# Patient Record
Sex: Male | Born: 1955
Health system: Southern US, Community
[De-identification: ages and names within clinical notes are randomized; demographics above are authoritative.]

## PROBLEM LIST (undated history)

## (undated) DIAGNOSIS — I700 Atherosclerosis of aorta: Secondary | ICD-10-CM

## (undated) DIAGNOSIS — N400 Enlarged prostate without lower urinary tract symptoms: Secondary | ICD-10-CM

## (undated) DIAGNOSIS — Z87442 Personal history of urinary calculi: Secondary | ICD-10-CM

## (undated) DIAGNOSIS — I441 Atrioventricular block, second degree: Secondary | ICD-10-CM

## (undated) DIAGNOSIS — D3502 Benign neoplasm of left adrenal gland: Secondary | ICD-10-CM

## (undated) DIAGNOSIS — Z7901 Long term (current) use of anticoagulants: Secondary | ICD-10-CM

## (undated) DIAGNOSIS — I480 Paroxysmal atrial fibrillation: Secondary | ICD-10-CM

## (undated) DIAGNOSIS — I2510 Atherosclerotic heart disease of native coronary artery without angina pectoris: Secondary | ICD-10-CM

## (undated) DIAGNOSIS — G4719 Other hypersomnia: Secondary | ICD-10-CM

## (undated) DIAGNOSIS — I10 Essential (primary) hypertension: Secondary | ICD-10-CM

## (undated) DIAGNOSIS — Z7902 Long term (current) use of antithrombotics/antiplatelets: Secondary | ICD-10-CM

## (undated) DIAGNOSIS — R011 Cardiac murmur, unspecified: Secondary | ICD-10-CM

## (undated) DIAGNOSIS — E7800 Pure hypercholesterolemia, unspecified: Secondary | ICD-10-CM

## (undated) DIAGNOSIS — I5189 Other ill-defined heart diseases: Secondary | ICD-10-CM

## (undated) DIAGNOSIS — M1990 Unspecified osteoarthritis, unspecified site: Secondary | ICD-10-CM

## (undated) DIAGNOSIS — I219 Acute myocardial infarction, unspecified: Secondary | ICD-10-CM

## (undated) DIAGNOSIS — A749 Chlamydial infection, unspecified: Secondary | ICD-10-CM

## (undated) DIAGNOSIS — I350 Nonrheumatic aortic (valve) stenosis: Secondary | ICD-10-CM

## (undated) DIAGNOSIS — Z950 Presence of cardiac pacemaker: Secondary | ICD-10-CM

## (undated) HISTORY — PX: CORONARY ARTERY BYPASS GRAFT: SHX141

## (undated) HISTORY — PX: APPENDECTOMY: SHX54

## (undated) HISTORY — PX: TRANSCATHETER AORTIC VALVE REPLACEMENT, TRANSAORTIC: SHX6402

## (undated) HISTORY — PX: INSERT / REPLACE / REMOVE PACEMAKER: SUR710

## (undated) HISTORY — PX: KNEE ARTHROSCOPY: SUR90

---

## 2008-07-26 HISTORY — PX: CARDIAC CATHETERIZATION: SHX172

## 2008-09-21 ENCOUNTER — Emergency Department: Payer: Self-pay | Admitting: Emergency Medicine

## 2008-09-23 DIAGNOSIS — I469 Cardiac arrest, cause unspecified: Secondary | ICD-10-CM

## 2008-09-23 DIAGNOSIS — Z951 Presence of aortocoronary bypass graft: Secondary | ICD-10-CM

## 2008-09-23 DIAGNOSIS — I2119 ST elevation (STEMI) myocardial infarction involving other coronary artery of inferior wall: Secondary | ICD-10-CM

## 2008-09-23 HISTORY — DX: Cardiac arrest, cause unspecified: I46.9

## 2008-09-23 HISTORY — DX: ST elevation (STEMI) myocardial infarction involving other coronary artery of inferior wall: I21.19

## 2008-09-23 HISTORY — PX: CORONARY ARTERY BYPASS GRAFT: SHX141

## 2008-09-23 HISTORY — DX: Presence of aortocoronary bypass graft: Z95.1

## 2008-10-16 ENCOUNTER — Encounter: Payer: Self-pay | Admitting: Internal Medicine

## 2008-10-24 ENCOUNTER — Encounter: Payer: Self-pay | Admitting: Internal Medicine

## 2010-09-15 ENCOUNTER — Ambulatory Visit: Payer: Self-pay | Admitting: Gastroenterology

## 2010-09-17 LAB — PATHOLOGY REPORT

## 2016-03-15 ENCOUNTER — Inpatient Hospital Stay
Admit: 2016-03-15 | Discharge: 2016-03-15 | Disposition: A | Discharge status: Home / Self Care | Payer: Commercial Managed Care - HMO | Attending: Internal Medicine | Admitting: Internal Medicine

## 2016-03-15 ENCOUNTER — Encounter: Payer: Self-pay | Admitting: Emergency Medicine

## 2016-03-15 ENCOUNTER — Emergency Department: Payer: Commercial Managed Care - HMO

## 2016-03-15 ENCOUNTER — Inpatient Hospital Stay
Admission: EM | Admit: 2016-03-15 | Discharge: 2016-03-17 | DRG: 281 | Disposition: A | Discharge status: Home / Self Care | Payer: Commercial Managed Care - HMO | Attending: Internal Medicine | Admitting: Internal Medicine

## 2016-03-15 DIAGNOSIS — Z8249 Family history of ischemic heart disease and other diseases of the circulatory system: Secondary | ICD-10-CM | POA: Diagnosis not present

## 2016-03-15 DIAGNOSIS — Z9889 Other specified postprocedural states: Secondary | ICD-10-CM | POA: Diagnosis not present

## 2016-03-15 DIAGNOSIS — I214 Non-ST elevation (NSTEMI) myocardial infarction: Secondary | ICD-10-CM | POA: Diagnosis present

## 2016-03-15 DIAGNOSIS — R55 Syncope and collapse: Secondary | ICD-10-CM | POA: Diagnosis present

## 2016-03-15 DIAGNOSIS — T464X6A Underdosing of angiotensin-converting-enzyme inhibitors, initial encounter: Secondary | ICD-10-CM | POA: Diagnosis present

## 2016-03-15 DIAGNOSIS — I25118 Atherosclerotic heart disease of native coronary artery with other forms of angina pectoris: Secondary | ICD-10-CM | POA: Diagnosis present

## 2016-03-15 DIAGNOSIS — R778 Other specified abnormalities of plasma proteins: Secondary | ICD-10-CM | POA: Diagnosis present

## 2016-03-15 DIAGNOSIS — I2582 Chronic total occlusion of coronary artery: Secondary | ICD-10-CM | POA: Diagnosis present

## 2016-03-15 DIAGNOSIS — T39016A Underdosing of aspirin, initial encounter: Secondary | ICD-10-CM | POA: Diagnosis present

## 2016-03-15 DIAGNOSIS — E782 Mixed hyperlipidemia: Secondary | ICD-10-CM | POA: Diagnosis present

## 2016-03-15 DIAGNOSIS — Z7902 Long term (current) use of antithrombotics/antiplatelets: Secondary | ICD-10-CM | POA: Diagnosis not present

## 2016-03-15 DIAGNOSIS — I161 Hypertensive emergency: Secondary | ICD-10-CM | POA: Diagnosis present

## 2016-03-15 DIAGNOSIS — Z91120 Patient's intentional underdosing of medication regimen due to financial hardship: Secondary | ICD-10-CM | POA: Diagnosis not present

## 2016-03-15 DIAGNOSIS — Z9114 Patient's other noncompliance with medication regimen: Secondary | ICD-10-CM | POA: Diagnosis not present

## 2016-03-15 DIAGNOSIS — Z951 Presence of aortocoronary bypass graft: Secondary | ICD-10-CM | POA: Diagnosis not present

## 2016-03-15 DIAGNOSIS — I252 Old myocardial infarction: Secondary | ICD-10-CM | POA: Diagnosis not present

## 2016-03-15 DIAGNOSIS — I25709 Atherosclerosis of coronary artery bypass graft(s), unspecified, with unspecified angina pectoris: Secondary | ICD-10-CM | POA: Diagnosis present

## 2016-03-15 DIAGNOSIS — E119 Type 2 diabetes mellitus without complications: Secondary | ICD-10-CM | POA: Diagnosis present

## 2016-03-15 DIAGNOSIS — Y92009 Unspecified place in unspecified non-institutional (private) residence as the place of occurrence of the external cause: Secondary | ICD-10-CM

## 2016-03-15 DIAGNOSIS — I2510 Atherosclerotic heart disease of native coronary artery without angina pectoris: Secondary | ICD-10-CM | POA: Diagnosis not present

## 2016-03-15 DIAGNOSIS — I10 Essential (primary) hypertension: Secondary | ICD-10-CM | POA: Diagnosis not present

## 2016-03-15 DIAGNOSIS — Z7982 Long term (current) use of aspirin: Secondary | ICD-10-CM | POA: Diagnosis not present

## 2016-03-15 DIAGNOSIS — Z8674 Personal history of sudden cardiac arrest: Secondary | ICD-10-CM | POA: Diagnosis not present

## 2016-03-15 DIAGNOSIS — E860 Dehydration: Secondary | ICD-10-CM | POA: Diagnosis not present

## 2016-03-15 DIAGNOSIS — I209 Angina pectoris, unspecified: Secondary | ICD-10-CM | POA: Insufficient documentation

## 2016-03-15 DIAGNOSIS — R7989 Other specified abnormal findings of blood chemistry: Secondary | ICD-10-CM | POA: Diagnosis present

## 2016-03-15 DIAGNOSIS — E7800 Pure hypercholesterolemia, unspecified: Secondary | ICD-10-CM | POA: Diagnosis not present

## 2016-03-15 HISTORY — DX: Essential (primary) hypertension: I10

## 2016-03-15 HISTORY — DX: Pure hypercholesterolemia, unspecified: E78.00

## 2016-03-15 HISTORY — DX: Acute myocardial infarction, unspecified: I21.9

## 2016-03-15 HISTORY — DX: Non-ST elevation (NSTEMI) myocardial infarction: I21.4

## 2016-03-15 LAB — TROPONIN I
TROPONIN I: 0.18 ng/mL — AB (ref <0.03)
TROPONIN I: 0.44 ng/mL — AB (ref <0.03)
Troponin I: 2.43 ng/mL (ref <0.03)

## 2016-03-15 LAB — BASIC METABOLIC PANEL
ANION GAP: 7 (ref 5–15)
BUN: 20 mg/dL (ref 6–20)
CO2: 25 mmol/L (ref 22–32)
Calcium: 9.2 mg/dL (ref 8.9–10.3)
Chloride: 105 mmol/L (ref 101–111)
Creatinine, Ser: 0.99 mg/dL (ref 0.61–1.24)
GFR calc Af Amer: >60 mL/min (ref >60)
Glucose, Bld: 130 mg/dL — ABNORMAL HIGH (ref 65–99)
POTASSIUM: 3.6 mmol/L (ref 3.5–5.1)
SODIUM: 137 mmol/L (ref 135–145)

## 2016-03-15 LAB — LIPID PANEL
CHOL/HDL RATIO: 2.7 ratio
CHOLESTEROL: 239 mg/dL — AB (ref 0–200)
HDL: 89 mg/dL (ref >40)
LDL Cholesterol: 144 mg/dL — ABNORMAL HIGH (ref 0–99)
Triglycerides: 28 mg/dL (ref <150)
VLDL: 6 mg/dL (ref 0–40)

## 2016-03-15 LAB — MRSA PCR SCREENING: MRSA by PCR: NEGATIVE

## 2016-03-15 LAB — CBC
HEMATOCRIT: 45.9 % (ref 40.0–52.0)
Hemoglobin: 15.9 g/dL (ref 13.0–18.0)
MCH: 30.6 pg (ref 26.0–34.0)
MCHC: 34.5 g/dL (ref 32.0–36.0)
MCV: 88.5 fL (ref 80.0–100.0)
Platelets: 231 10*3/uL (ref 150–440)
RBC: 5.19 MIL/uL (ref 4.40–5.90)
RDW: 13.9 % (ref 11.5–14.5)
WBC: 8.4 10*3/uL (ref 3.8–10.6)

## 2016-03-15 LAB — HEMOGLOBIN A1C: HEMOGLOBIN A1C: 5.7 % (ref 4.0–6.0)

## 2016-03-15 LAB — PROTIME-INR
INR: 1.05
PROTHROMBIN TIME: 13.7 s (ref 11.4–15.2)

## 2016-03-15 LAB — GLUCOSE, CAPILLARY: Glucose-Capillary: 123 mg/dL — ABNORMAL HIGH (ref 65–99)

## 2016-03-15 LAB — HEPARIN LEVEL (UNFRACTIONATED)
HEPARIN UNFRACTIONATED: 0.26 [IU]/mL — AB (ref 0.30–0.70)
Heparin Unfractionated: 0.42 IU/mL (ref 0.30–0.70)

## 2016-03-15 LAB — TSH: TSH: 0.438 u[IU]/mL (ref 0.350–4.500)

## 2016-03-15 LAB — APTT: APTT: 35 s (ref 24–36)

## 2016-03-15 MED ORDER — ACETAMINOPHEN 325 MG PO TABS
650.0000 mg | ORAL_TABLET | Freq: Four times a day (QID) | ORAL | Status: DC | PRN
  Administered 2016-03-15 – 2016-03-16 (×2): 650 mg via ORAL
  Filled 2016-03-15 (×3): qty 2

## 2016-03-15 MED ORDER — CARVEDILOL 6.25 MG PO TABS
12.5000 mg | ORAL_TABLET | Freq: Two times a day (BID) | ORAL | Status: DC
  Administered 2016-03-16: 12.5 mg via ORAL
  Filled 2016-03-15: qty 2

## 2016-03-15 MED ORDER — NITROGLYCERIN 0.4 MG SL SUBL
0.4000 mg | SUBLINGUAL_TABLET | SUBLINGUAL | Status: DC | PRN
  Administered 2016-03-15 (×2): 0.4 mg via SUBLINGUAL

## 2016-03-15 MED ORDER — SENNOSIDES-DOCUSATE SODIUM 8.6-50 MG PO TABS: 1.0000 | ORAL_TABLET | Freq: Every evening | ORAL | Status: DC | PRN

## 2016-03-15 MED ORDER — SIMVASTATIN 20 MG PO TABS: 20.0000 mg | ORAL_TABLET | Freq: Every day | ORAL | Status: DC

## 2016-03-15 MED ORDER — ONDANSETRON HCL 4 MG PO TABS: 4.0000 mg | ORAL_TABLET | Freq: Four times a day (QID) | ORAL | Status: DC | PRN

## 2016-03-15 MED ORDER — CETYLPYRIDINIUM CHLORIDE 0.05 % MT LIQD
7.0000 mL | Freq: Two times a day (BID) | OROMUCOSAL | Status: DC
  Administered 2016-03-16 – 2016-03-17 (×2): 7 mL via OROMUCOSAL

## 2016-03-15 MED ORDER — ASPIRIN 81 MG PO CHEW
324.0000 mg | CHEWABLE_TABLET | Freq: Once | ORAL | Status: AC
  Administered 2016-03-15: 324 mg via ORAL

## 2016-03-15 MED ORDER — BISACODYL 10 MG RE SUPP: 10.0000 mg | Freq: Every day | RECTAL | Status: DC | PRN

## 2016-03-15 MED ORDER — LISINOPRIL 20 MG PO TABS
20.0000 mg | ORAL_TABLET | Freq: Every day | ORAL | Status: DC
  Administered 2016-03-16: 20 mg via ORAL
  Filled 2016-03-15: qty 1

## 2016-03-15 MED ORDER — ASPIRIN 81 MG PO CHEW
81.0000 mg | CHEWABLE_TABLET | Freq: Every day | ORAL | Status: DC
  Administered 2016-03-16 – 2016-03-17 (×2): 81 mg via ORAL
  Filled 2016-03-15 (×2): qty 1

## 2016-03-15 MED ORDER — ACETAMINOPHEN 650 MG RE SUPP
650.0000 mg | Freq: Four times a day (QID) | RECTAL | Status: DC | PRN
  Filled 2016-03-15: qty 1

## 2016-03-15 MED ORDER — HEPARIN (PORCINE) IN NACL 100-0.45 UNIT/ML-% IJ SOLN
1400.0000 [IU]/h | INTRAMUSCULAR | Status: DC
  Administered 2016-03-15: 1200 [IU]/h via INTRAVENOUS
  Administered 2016-03-15: 1000 [IU]/h via INTRAVENOUS
  Administered 2016-03-16: 1200 [IU]/h via INTRAVENOUS
  Filled 2016-03-15 (×4): qty 250

## 2016-03-15 MED ORDER — HEPARIN BOLUS VIA INFUSION
4000.0000 [IU] | Freq: Once | INTRAVENOUS | Status: AC
Start: 2016-03-15 — End: 2016-03-15
  Administered 2016-03-15: 4000 [IU] via INTRAVENOUS
  Filled 2016-03-15: qty 4000

## 2016-03-15 MED ORDER — ASPIRIN 81 MG PO CHEW
CHEWABLE_TABLET | ORAL | Status: AC
  Administered 2016-03-15: 324 mg via ORAL
  Filled 2016-03-15: qty 4

## 2016-03-15 MED ORDER — HEPARIN BOLUS VIA INFUSION
1500.0000 [IU] | Freq: Once | INTRAVENOUS | Status: AC
  Administered 2016-03-15: 1500 [IU] via INTRAVENOUS
  Filled 2016-03-15: qty 1500

## 2016-03-15 MED ORDER — SODIUM CHLORIDE 0.9 % IV SOLN
INTRAVENOUS | Status: DC
  Administered 2016-03-15: 11:00:00 via INTRAVENOUS

## 2016-03-15 MED ORDER — ONDANSETRON HCL 4 MG/2ML IJ SOLN: 4.0000 mg | Freq: Four times a day (QID) | INTRAMUSCULAR | Status: DC | PRN

## 2016-03-15 MED ORDER — NITROGLYCERIN IN D5W 200-5 MCG/ML-% IV SOLN
0.0000 ug/min | Freq: Once | INTRAVENOUS | Status: AC
  Administered 2016-03-15: 5 ug/min via INTRAVENOUS
  Filled 2016-03-15: qty 250

## 2016-03-15 MED ORDER — NITROGLYCERIN 0.4 MG SL SUBL
SUBLINGUAL_TABLET | SUBLINGUAL | Status: AC
  Administered 2016-03-15: 0.4 mg via SUBLINGUAL
  Filled 2016-03-15: qty 1

## 2016-03-15 MED ORDER — NITROGLYCERIN IN D5W 200-5 MCG/ML-% IV SOLN
0.0000 ug/min | INTRAVENOUS | Status: DC
  Administered 2016-03-15: 70 ug/min via INTRAVENOUS
  Filled 2016-03-15: qty 250

## 2016-03-15 MED ORDER — HYDROCODONE-ACETAMINOPHEN 5-325 MG PO TABS: 1.0000 | ORAL_TABLET | ORAL | Status: DC | PRN

## 2016-03-15 MED ORDER — KETOROLAC TROMETHAMINE 15 MG/ML IJ SOLN
15.0000 mg | Freq: Four times a day (QID) | INTRAMUSCULAR | Status: DC | PRN
  Administered 2016-03-16: 15 mg via INTRAVENOUS
  Filled 2016-03-15: qty 1

## 2016-03-15 NOTE — ED Triage Notes (Signed)
Patient presents to the ED with right arm "pain/numbness".  Patient reports history of heart attack and states this feels similarly.  Patient states he has htn but has not been taking medication due to cost of medication.  Patient denies chest pain and shortness of breath.  Patient is in no obvious distress at this time. Patient has history of heart surgery.  Patient states, "my cardiologist is Dr. Clayborn Bigness and I know it's time I see him."

## 2016-03-15 NOTE — ED Provider Notes (Signed)
Joseph Schmitt - [facility] Emergency Department Provider Note  ____________________________________________  Time seen: Approximately 9:36 AM  I have reviewed the triage vital signs and the nursing notes.   HISTORY  Chief Complaint Arm Pain   HPI Joseph Schmitt is a 60 y.o. male with a history of cardiac arrest, CAD status post CABG 5, hypertension, hyperlipidemia who presents for evaluation of right arm numbness. Patient reports that these symptoms are identical to when he had his cardiac arrest in 2011. He denies chest pain, shortness of breath, dizziness, nausea, vomiting, back pain. He reports that he went to work this morning started having numbness in his right arm which made him concerned to come to the emergency department. Patient reports hasn't taken any of his medications for over 6 months as he ran out of prescriptions and never called his cardiologist. He is a patient of Dr. Clayborn Bigness.   Past Medical History:  Diagnosis Date  . Heart attack ([place])   . Hypercholesteremia   . Hypertension     Patient Active Problem List   Diagnosis Date Noted  . Elevated troponin 03/15/2016    Past Surgical History:  Procedure Laterality Date  . CORONARY ARTERY BYPASS GRAFT      Prior to Admission medications   Not on File    Allergies Review of patient's allergies indicates no known allergies.  No family history on file.  Social History Social History  Substance Use Topics  . Smoking status: Not on file  . Smokeless tobacco: Not on file  . Alcohol use No    Review of Systems  Constitutional: Negative for fever. Eyes: Negative for visual changes. ENT: Negative for sore throat. Cardiovascular: Negative for chest pain. Respiratory: Negative for shortness of breath. Gastrointestinal: Negative for abdominal pain, vomiting or diarrhea. Genitourinary: Negative for dysuria. Musculoskeletal: Negative for back pain. + R arm numbness Skin: Negative for  rash. Neurological: Negative for headaches, weakness or numbness.  ____________________________________________   PHYSICAL EXAM:  VITAL SIGNS: ED Triage Vitals  Enc Vitals Group     BP 03/15/16 0829 (!) 235/111     Pulse Rate 03/15/16 0829 78     Resp 03/15/16 0829 18     Temp 03/15/16 0829 97.6 F (36.4 C)     Temp Source 03/15/16 0829 Oral     SpO2 03/15/16 0829 98 %     Weight --      Height --      Head Circumference --      Peak Flow --      Pain Score 03/15/16 0834 6     Pain Loc --      Pain Edu? --      Excl. in [place]? --     Constitutional: Alert and oriented. Well appearing and in no apparent distress. HEENT:      Head: Normocephalic and atraumatic.         Eyes: Conjunctivae are normal. Sclera is non-icteric. EOMI. PERRL      Mouth/Throat: Mucous membranes are moist.       Neck: Supple with no signs of meningismus. Cardiovascular: Regular rate and rhythm. No murmurs, gallops, or rubs. 2+ symmetrical distal pulses are present in all extremities. No JVD. Respiratory: Normal respiratory effort. Lungs are clear to auscultation bilaterally. No wheezes, crackles, or rhonchi.  Gastrointestinal: Soft, non tender, and non distended with positive bowel sounds. No rebound or guarding. Genitourinary: No CVA tenderness. Musculoskeletal: Nontender with normal range of motion in all extremities. No edema, cyanosis,  or erythema of extremities. Neurologic: Normal speech and language. Face is symmetric. Moving all extremities. No gross focal neurologic deficits are appreciated. Skin: Skin is warm, dry and intact. No rash noted. Psychiatric: Mood and affect are normal. Speech and behavior are normal.  ____________________________________________   LABS (all labs ordered are listed, but only abnormal results are displayed)  Labs Reviewed  BASIC METABOLIC PANEL - Abnormal; Notable for the following:       Result Value   Glucose, Bld 130 (*)    All other components within normal  limits  TROPONIN I - Abnormal; Notable for the following:    Troponin I 0.18 (*)    All other components within normal limits  GLUCOSE, CAPILLARY - Abnormal; Notable for the following:    Glucose-Capillary 123 (*)    All other components within normal limits  LIPID PANEL - Abnormal; Notable for the following:    Cholesterol 239 (*)    LDL Cholesterol 144 (*)    All other components within normal limits  TROPONIN I - Abnormal; Notable for the following:    Troponin I 0.44 (*)    All other components within normal limits  TROPONIN I - Abnormal; Notable for the following:    Troponin I 2.43 (*)    All other components within normal limits  HEPARIN LEVEL (UNFRACTIONATED) - Abnormal; Notable for the following:    Heparin Unfractionated 0.26 (*)    All other components within normal limits  MRSA PCR SCREENING  CBC  PROTIME-INR  APTT  HEPARIN LEVEL (UNFRACTIONATED)  TSH  HEMOGLOBIN A1C  TROPONIN I  BASIC METABOLIC PANEL  CBC  HEPARIN LEVEL (UNFRACTIONATED)   ____________________________________________  EKG  ED ECG REPORT I, Rudene Re, the attending physician, personally viewed and interpreted this ECG.  8:31 AM - normal sinus rhythm, rate of 79, 1 mm ST elevation in aVR with ST depressions on the inferior and lateral leads. No prior for comparison  9:19 AM - sinus rhythm, rate of 98, persistent ST elevation in aVR with worsening ST depressions in inferior leads and lateral leads.  9:32 AM - sinus rhythm, rate of 87, improved ST elevations in aVR and ST depressions in inferior and lateral leads. ____________________________________________  RADIOLOGY  CXR: negative  ____________________________________________   PROCEDURES  Procedure(s) performed: None Procedures Critical Care performed: yes  CRITICAL CARE Performed by: Rudene Re  ?  Total critical care time: 40 min   Critical care time was exclusive of separately billable procedures and  treating other patients.  Critical care was necessary to treat or prevent imminent or life-threatening deterioration.  Critical care was time spent personally by me on the following activities: development of treatment plan with patient and/or surrogate as well as nursing, discussions with consultants, evaluation of patient's response to treatment, examination of patient, obtaining history from patient or surrogate, ordering and performing treatments and interventions, ordering and review of laboratory studies, ordering and review of radiographic studies, pulse oximetry and re-evaluation of patient's condition.  ____________________________________________   INITIAL IMPRESSION / ASSESSMENT AND PLAN / ED COURSE  60 y.o. male with a history of cardiac arrest, CAD status post CABG 5, hypertension, hyperlipidemia who presents for evaluation of right arm numbness. EKG showing ST elevations in aVR and depressions in inferior lateral leads. Patient severely hypertensive. Patient received 2 sublingual nitros with lowering of his blood pressure and resolution of the right arm numbness. Repeat EKG showing active ischemic changes. I discussed with Dr. Nehemiah Massed and Dr. Fletcher Anon, cardiology. I  expressed my concerns with the ST elevations in aVR however both of them agree with medical management at this time as long as patient remains symptoms free. His initial troponin was 0.18. Patient was started on heparin drip and nitro drip. Serial EKGs were done showing acute ischemic changes but according to cardiology not meeting STEMI criteria. Patient is currently pain-free. Also received a full dose of aspirin. We'll admit to the hospitalist with plan for catheter lab later today.  Clinical Course    Pertinent labs & imaging results that were available during my care of the patient were reviewed by me and considered in my medical decision making (see chart for  details).    ____________________________________________   FINAL CLINICAL IMPRESSION(S) / ED DIAGNOSES  Final diagnoses:  NSTEMI (non-ST elevated myocardial infarction) [facility])  Hypertensive emergency      NEW MEDICATIONS STARTED DURING THIS VISIT:  There are no discharge medications for this patient.    Note:  This document was prepared using Dragon voice recognition software and may include unintentional dictation errors.    Rudene Re, MD 03/15/16 2147

## 2016-03-15 NOTE — H&P (Signed)
[facility] [place] NAME: Joseph Schmitt    MR#:  [id number]  DATE OF BIRTH:  [birth year]  DATE OF ADMISSION:  03/15/2016  PRIMARY CARE PHYSICIAN: No PCP Per Patient   REQUESTING/REFERRING PHYSICIAN: dr Alfred Levins  CHIEF COMPLAINT:    Left arm pain HISTORY OF PRESENT ILLNESS:  Joseph Schmitt  is a 60 y.o. male with a known history of CAD status post 5 vessel CABG, essential hypertension, hyperlipidemia who presents with above complaint. Patient reports that the symptoms are similar to his previous heart attack where he had a cardiac arrest. In the emergency room patient has old pets place. In the emergency room he had some ST depressions associated with elevated blood pressure. There was questionable ST elevation on EKG however it was evaluated with Dr. Fletcher Anon and it is not ST elevation. Patient has been off of medications due to financial reasons over the past several months. He is currently symptom free.  PAST MEDICAL HISTORY:   Past Medical History:  Diagnosis Date  . Heart attack ([place])   . Hypercholesteremia   . Hypertension     PAST SURGICAL HISTORY:   Past Surgical History:  Procedure Laterality Date  . CORONARY ARTERY BYPASS GRAFT      SOCIAL HISTORY:   No tobacco EtOH or IV drug use               No    FAMILY HISTORY:  Positive for hyperlipidemia, hypertension, CAD  DRUG ALLERGIES:  No Known Allergies  REVIEW OF SYSTEMS:   Review of Systems  Constitutional: Negative.  Negative for chills, fever and malaise/fatigue.  HENT: Negative.  Negative for ear discharge, ear pain, hearing loss, nosebleeds and sore throat.   Eyes: Negative.  Negative for blurred vision and pain.  Respiratory: Negative.  Negative for cough, hemoptysis, shortness of breath and wheezing.   Cardiovascular: Negative.  Negative for chest pain, palpitations and leg swelling.  Gastrointestinal: Negative.  Negative for abdominal pain, blood  in stool, diarrhea, nausea and vomiting.  Genitourinary: Negative.  Negative for dysuria.  Musculoskeletal: Negative.  Negative for back pain.  Skin: Negative.   Neurological: Negative for dizziness, tremors, speech change, focal weakness, seizures and headaches.  Endo/Heme/Allergies: Negative.  Does not bruise/bleed easily.  Psychiatric/Behavioral: Negative.  Negative for depression, hallucinations and suicidal ideas.    MEDICATIONS AT HOME:   Prior to Admission medications   Not on File      VITAL SIGNS:  Blood pressure (!) 148/104, pulse 79, temperature 97.7 F (36.5 C), temperature source Oral, resp. rate (!) 22, height 5\' 11"  (1.803 m), weight 113.7 kg (250 lb 10.6 oz), SpO2 95 %.  PHYSICAL EXAMINATION:   Physical Exam  Constitutional: He is oriented to person, place, and time and well-developed, well-nourished, and in no distress. No distress.  HENT:  Head: Normocephalic.  Eyes: No scleral icterus.  Neck: Normal range of motion. Neck supple. No JVD present. No tracheal deviation present.  Cardiovascular: Normal rate, regular rhythm and normal heart sounds.  Exam reveals no gallop and no friction rub.   No murmur heard. Pulmonary/Chest: Effort normal and breath sounds normal. No respiratory distress. He has no wheezes. He has no rales. He exhibits no tenderness.  Abdominal: Soft. Bowel sounds are normal. He exhibits no distension and no mass. There is no tenderness. There is no rebound and no guarding.  Musculoskeletal: Normal range of motion. He exhibits no edema.  Neurological: He is alert and oriented  to person, place, and time.  Skin: Skin is warm. No rash noted. No erythema.  Psychiatric: Affect and judgment normal.      LABORATORY PANEL:   CBC  Recent Labs Lab 03/15/16 0848  WBC 8.4  HGB 15.9  HCT 45.9  PLT 231   ------------------------------------------------------------------------------------------------------------------  Chemistries   Recent  Labs Lab 03/15/16 0848  NA 137  K 3.6  CL 105  CO2 25  GLUCOSE 130*  BUN 20  CREATININE 0.99  CALCIUM 9.2   ------------------------------------------------------------------------------------------------------------------  Cardiac Enzymes  Recent Labs Lab 03/15/16 1116  TROPONINI 0.44*   ------------------------------------------------------------------------------------------------------------------  RADIOLOGY:  Dg Chest 2 View  Result Date: 03/15/2016 CLINICAL DATA:  Right arm numbness. EXAM: CHEST  2 VIEW COMPARISON:  09/21/2008 FINDINGS: Postsurgical changes from CABG is seen. Cardiomediastinal silhouette is normal. Mediastinal contours appear intact. The thoracic aorta is torturous and contains atherosclerotic calcifications at the arch. There is no evidence of focal airspace consolidation, pleural effusion or pneumothorax. Osseous structures are without acute abnormality. Soft tissues are grossly normal. IMPRESSION: No active cardiopulmonary disease. Tortuosity and atherosclerotic disease of the aorta. Electronically Signed   By: Fidela Salisbury M.D.   On: 03/15/2016 09:06    EKG:   Most recent EKG shows sinus rhythm heart rate of 87 with improved ST elevations in aVR and ST depressions in inferior and lateral leads.  IMPRESSION AND PLAN:   60 year old male with a history of CAD/CABG who presents with left arm pain equivalent to angina.  1. Elevated troponin in the setting of angina equivalent with possible non-ST elevation MI: Patient is to undergo cardiac catheterization today. Continue aspirin, Coreg, lisinopril and statin. Order echocardiogram. Continue heparin drip.  2. Essential hypertension: Continue lisinopril and Coreg for now.  3. History of hyperlipidemia: Continue statin and check lipid panel.  Social worker consult for disposition medication management.  All the records are reviewed and case discussed with ED provider. Management plans  discussed with the patient and he in agreement  CODE STATUS: full  TOTAL TIME TAKING CARE OF THIS PATIENT: 50 minutes.    [name] M.D on 03/15/2016 at 1:51 PM  Between 7am to 6pm - Pager - [phone]  After 6pm go to [email or web address] - password [email or web address] [facility]  Office  [phone]  CC: Primary care physician; No PCP Per Patient

## 2016-03-15 NOTE — Progress Notes (Addendum)
ANTICOAGULATION CONSULT NOTE - Initial Consult  Pharmacy Consult for Heparin Indication: chest pain/ACS  No Known Allergies  Patient Measurements: Height: 5\' 11"  (180.3 cm) Weight: 250 lb 10.6 oz (113.7 kg) IBW/kg (Calculated) : 75.3 Heparin Dosing Weight: 98.8 kg  Vital Signs: Temp: 98.4 F (36.9 C) (08/21 1900) Temp Source: Oral (08/21 1900) BP: 149/77 (08/21 2000) Pulse Rate: 76 (08/21 2000)  Labs:  Recent Labs  03/15/16 0848 03/15/16 1116 03/15/16 1408 03/15/16 1706 03/15/16 2057  HGB 15.9  --   --   --   --   HCT 45.9  --   --   --   --   PLT 231  --   --   --   --   APTT 35  --   --   --   --   LABPROT 13.7  --   --   --   --   INR 1.05  --   --   --   --   HEPARINUNFRC  --   --  0.42  --  0.26*  CREATININE 0.99  --   --   --   --   TROPONINI 0.18* 0.44*  --  2.43*  --     Estimated Creatinine Clearance: 100.5 mL/min (by C-G formula based on SCr of 0.99 mg/dL).   Medical History: Past Medical History:  Diagnosis Date  . Heart attack ([place])   . Hypercholesteremia   . Hypertension     Medications:  Infusions:  . sodium chloride 75 mL/hr at 03/15/16 1800  . heparin 1,000 Units/hr (03/15/16 1800)  . nitroGLYCERIN 70 mcg/min (03/15/16 1800)    Assessment: 60 yo male admitted with history of Cardiac Arrest presents with right arm numbness, and EKG ST elevation.  Goal of Therapy:  Heparin level 0.3-0.7 units/ml Monitor platelets by anticoagulation protocol: Yes   Plan:  Give 4000 units bolus x 1 Start heparin infusion at 1000 units/hr Check anti-Xa level in 6 hours and daily while on heparin Continue to monitor H&H and platelets  8/21: Heparin level is therapeutic at 0.42. Continue heparin 1000 units/hr. Will check another HL in 6 hours.   James,Teldrin D 03/15/2016,9:28 PM   ADD: Heparin level resulted @ 0.26. Will give heparin bolus of 1500 units x 1 and will increase heparin gtt rate to 1200 units/hr. Will recheck Heparin level @ 04:00.    8/23 04:30 heparin level 0.40. Recheck in 6 hours to confirm.

## 2016-03-15 NOTE — Progress Notes (Signed)
ANTICOAGULATION CONSULT NOTE - Initial Consult  Pharmacy Consult for Heparin Indication: chest pain/ACS  No Known Allergies  Patient Measurements: Height: 5\' 10"  (177.8 cm) Weight: 257 lb (116.6 kg) IBW/kg (Calculated) : 73 Heparin Dosing Weight: 98.8 kg  Vital Signs: Temp: 97.6 F (36.4 C) (08/21 0829) Temp Source: Oral (08/21 0829) BP: 199/119 (08/21 1003) Pulse Rate: 78 (08/21 1003)  Labs:  Recent Labs  03/15/16 0848  HGB 15.9  HCT 45.9  PLT 231  CREATININE 0.99  TROPONINI 0.18*    Estimated Creatinine Clearance: 100.2 mL/min (by C-G formula based on SCr of 0.99 mg/dL).   Medical History: Past Medical History:  Diagnosis Date  . Heart attack ([place])   . Hypercholesteremia   . Hypertension     Medications:  Infusions:  . heparin      Assessment: 60 yo male admitted with history of Cardiac Arrest presents with right arm numbness, and EKG ST elevation.  Goal of Therapy:  Heparin level 0.3-0.7 units/ml Monitor platelets by anticoagulation protocol: Yes   Plan:  Give 4000 units bolus x 1 Start heparin infusion at 1000 units/hr Check anti-Xa level in 6 hours and daily while on heparin Continue to monitor H&H and platelets  Lion Fernandez K 03/15/2016,10:04 AM

## 2016-03-15 NOTE — Progress Notes (Signed)
ANTICOAGULATION CONSULT NOTE - Initial Consult  Pharmacy Consult for Heparin Indication: chest pain/ACS  No Known Allergies  Patient Measurements: Height: 5\' 11"  (180.3 cm) Weight: 250 lb 10.6 oz (113.7 kg) IBW/kg (Calculated) : 75.3 Heparin Dosing Weight: 98.8 kg  Vital Signs: Temp: 98 F (36.7 C) (08/21 1300) Temp Source: Oral (08/21 1300) BP: 160/89 (08/21 1500) Pulse Rate: 69 (08/21 1500)  Labs:  Recent Labs  03/15/16 0848 03/15/16 1116 03/15/16 1408  HGB 15.9  --   --   HCT 45.9  --   --   PLT 231  --   --   APTT 35  --   --   LABPROT 13.7  --   --   INR 1.05  --   --   HEPARINUNFRC  --   --  0.42  CREATININE 0.99  --   --   TROPONINI 0.18* 0.44*  --     Estimated Creatinine Clearance: 100.5 mL/min (by C-G formula based on SCr of 0.99 mg/dL).   Medical History: Past Medical History:  Diagnosis Date  . Heart attack ([place])   . Hypercholesteremia   . Hypertension     Medications:  Infusions:  . sodium chloride 75 mL/hr at 03/15/16 1500  . heparin 1,000 Units/hr (03/15/16 1500)  . nitroGLYCERIN 70 mcg/min (03/15/16 1500)    Assessment: 60 yo male admitted with history of Cardiac Arrest presents with right arm numbness, and EKG ST elevation.  Goal of Therapy:  Heparin level 0.3-0.7 units/ml Monitor platelets by anticoagulation protocol: Yes   Plan:  Give 4000 units bolus x 1 Start heparin infusion at 1000 units/hr Check anti-Xa level in 6 hours and daily while on heparin Continue to monitor H&H and platelets  8/21: Heparin level is therapeutic at 0.42. Continue heparin 1000 units/hr. Will check another HL in 6 hours.   Loree Fee 03/15/2016,4:42 PM

## 2016-03-15 NOTE — ED Notes (Signed)
Pt on defib pads as precaution

## 2016-03-15 NOTE — ED Notes (Signed)
Dr Alfred Levins notified of troponin

## 2016-03-15 NOTE — Consult Note (Signed)
[facility] Consultation Note  Patient ID: Joseph Schmitt, MRN: [id number], DOB/AGE: Jan 24, 1956 60 y.o. Admit date: 03/15/2016   Date of Consult: 03/15/2016 Primary Physician: No PCP Per Patient Primary Cardiologist:[place]  Chief Complaint:  Chief Complaint  Patient presents with  . Arm Pain   Reason for Consult: chest pain with non-ST elevation myocardial infarction  HPI: 60 y.o. male with known coronary artery disease status post coronary bypass graft many years prior essential hypertension mixed hyperlipidemia and diabetes with application who is had noncompliance with medication management at this time. The patient has not been using ACE inhibitor and/or beta blocker or aspirin or other medication management due to an inability to afford it. He has had some more right sided chest discomfort increasing in frequency and intensity over the last several weeks and now at rest with current EKG showing normal sinus rhythm with lateral ST depression and troponin of 0.18 consistent with a non-ST elevation myocardial infarction. All of his symptoms have improved since addition of carvedilol as well as heparin ACE inhibitor and aspirin. The patient was also replaced on statin therapy for which he tolerates at this time.   Past Medical History:  Diagnosis Date  . Heart attack ([place])   . Hypercholesteremia   . Hypertension       Surgical History:  Past Surgical History:  Procedure Laterality Date  . CORONARY ARTERY BYPASS GRAFT       Home Meds: Prior to Admission medications   Not on File    Inpatient Medications:  . antiseptic oral rinse  7 mL Mouth Rinse BID  . aspirin  81 mg Oral Daily  . carvedilol  12.5 mg Oral BID WC  . lisinopril  20 mg Oral Daily  . simvastatin  20 mg Oral q1800   . sodium chloride 75 mL/hr at 03/15/16 1500  . heparin 1,000 Units/hr (03/15/16 1500)  . nitroGLYCERIN 70 mcg/min (03/15/16 1500)    Allergies: No Known Allergies  Social  History   Social History  . Marital status: Married    Spouse name: N/A  . Number of children: N/A  . Years of education: N/A   Occupational History  . Not on file.   Social History Main Topics  . Smoking status: Not on file  . Smokeless tobacco: Not on file  . Alcohol use No  . Drug use: Unknown  . Sexual activity: Not on file   Other Topics Concern  . Not on file   Social History Narrative  . No narrative on file     No family history on file.   Review of Systems Positive forChest pain and shortness of breath with a right arm discomfort Negative for: General:  chills, fever, night sweats or weight changes.  Cardiovascular: PND orthopnea syncope dizziness  Dermatological skin lesions rashes Respiratory: Cough congestion Urologic: Frequent urination urination at night and hematuria Abdominal: negative for nausea, vomiting, diarrhea, bright red blood per rectum, melena, or hematemesis Neurologic: negative for visual changes, and/or hearing changes  All other systems reviewed and are otherwise negative except as noted above.  Labs:  Recent Labs  03/15/16 0848 03/15/16 1116  TROPONINI 0.18* 0.44*   Lab Results  Component Value Date   WBC 8.4 03/15/2016   HGB 15.9 03/15/2016   HCT 45.9 03/15/2016   MCV 88.5 03/15/2016   PLT 231 03/15/2016    Recent Labs Lab 03/15/16 0848  NA 137  K 3.6  CL 105  CO2 25  BUN 20  CREATININE 0.99  CALCIUM 9.2  GLUCOSE 130*   Lab Results  Component Value Date   CHOL 239 (H) 03/15/2016   HDL 89 03/15/2016   LDLCALC 144 (H) 03/15/2016   TRIG 28 03/15/2016   No results found for: DDIMER  Radiology/Studies:  Dg Chest 2 View  Result Date: 03/15/2016 CLINICAL DATA:  Right arm numbness. EXAM: CHEST  2 VIEW COMPARISON:  09/21/2008 FINDINGS: Postsurgical changes from CABG is seen. Cardiomediastinal silhouette is normal. Mediastinal contours appear intact. The thoracic aorta is torturous and contains atherosclerotic  calcifications at the arch. There is no evidence of focal airspace consolidation, pleural effusion or pneumothorax. Osseous structures are without acute abnormality. Soft tissues are grossly normal. IMPRESSION: No active cardiopulmonary disease. Tortuosity and atherosclerotic disease of the aorta. Electronically Signed   By: Fidela Salisbury M.D.   On: 03/15/2016 09:06    [id number] sinus rhythm with lateral ST depression  Weights: Filed Weights   03/15/16 0956 03/15/16 1100  Weight: 257 lb (116.6 kg) 250 lb 10.6 oz (113.7 kg)     Physical Exam: Blood pressure (!) 160/89, pulse 69, temperature 98 F (36.7 C), temperature source Oral, resp. rate 18, height 5\' 11"  (1.803 m), weight 250 lb 10.6 oz (113.7 kg), SpO2 96 %. Body mass index is 34.96 kg/m. General: Well developed, well nourished, in no acute distress. Head eyes ears nose throat: Normocephalic, atraumatic, sclera non-icteric, no xanthomas, nares are without discharge. No apparent thyromegaly and/or mass  Lungs: Normal respiratory effort.  no wheezes, no rales, no rhonchi.  Heart: RRR with normal S1 Soft S2. 3 out of 6 right upper sternal border murmur gallop, no rub, PMI is normal size and placement, carotid upstroke normal without bruit, jugular venous pressure is normal Abdomen: Soft, non-tender, non-distended with normoactive bowel sounds. No hepatomegaly. No rebound/guarding. No obvious abdominal masses. Abdominal aorta is normal size without bruit Extremities: No edema. no cyanosis, no clubbing, no ulcers  Peripheral : 2+ bilateral upper extremity pulses, 2+ bilateral femoral pulses, 2+ bilateral dorsal pedal pulse Neuro: Alert and oriented. No facial asymmetry. No focal deficit. Moves all extremities spontaneously. Musculoskeletal: Normal muscle tone without kyphosis Psych:  Responds to questions appropriately with a normal affect.    Assessment: 60 year old male with known coronary disease as recorded by post graft  essential hypertension mixed hyperlipidemia with non-ST elevation myocardial infarction  Plan: Continue heparin for further risk reduction of myocardial infarction 2. Aspirin heparin and nitrates for chest pain 3. ACE inhibitor beta blocker for myocardial infarction 4. High intensity cholesterol therapy with the simvastatin 5. Proceed to cardiac catheterization to assess coronary anatomy and further treatment thereof is necessary. Patient understands the risk and benefits of cardiac catheterization. This includes a possibility of death stroke heart attack infection bleeding or blood clot. He is at low risk for conscious sedation  Signed, [name] M.D. [facility] 03/15/2016, 4:47 PM

## 2016-03-15 NOTE — Progress Notes (Signed)
CCMD called and stated pt had an eight-beat run of v-tach. Pt noted to be sleeping during the time. Assessment revealed no change in pt status. Pt denied chest pain or SOB during the episode of V-tach. Dr. Nehemiah Massed notified of same. Pt is schedule for a cardiac cath in the morning. Will continue to monitor.

## 2016-03-16 ENCOUNTER — Encounter
Admission: EM | Disposition: A | Discharge status: Home / Self Care | Payer: Self-pay | Source: Home / Self Care | Attending: Internal Medicine

## 2016-03-16 HISTORY — PX: CARDIAC CATHETERIZATION: SHX172

## 2016-03-16 LAB — CBC
HCT: 40.8 % (ref 40.0–52.0)
Hemoglobin: 14.2 g/dL (ref 13.0–18.0)
MCH: 30.5 pg (ref 26.0–34.0)
MCHC: 34.7 g/dL (ref 32.0–36.0)
MCV: 87.9 fL (ref 80.0–100.0)
PLATELETS: 231 10*3/uL (ref 150–440)
RBC: 4.64 MIL/uL (ref 4.40–5.90)
RDW: 13.9 % (ref 11.5–14.5)
WBC: 11.3 10*3/uL — ABNORMAL HIGH (ref 3.8–10.6)

## 2016-03-16 LAB — BASIC METABOLIC PANEL
Anion gap: 6 (ref 5–15)
BUN: 13 mg/dL (ref 6–20)
CHLORIDE: 103 mmol/L (ref 101–111)
CO2: 26 mmol/L (ref 22–32)
CREATININE: 0.74 mg/dL (ref 0.61–1.24)
Calcium: 8.7 mg/dL — ABNORMAL LOW (ref 8.9–10.3)
GFR calc non Af Amer: >60 mL/min (ref >60)
Glucose, Bld: 125 mg/dL — ABNORMAL HIGH (ref 65–99)
Potassium: 3.5 mmol/L (ref 3.5–5.1)
Sodium: 135 mmol/L (ref 135–145)

## 2016-03-16 LAB — ECHOCARDIOGRAM COMPLETE
Height: 71 in
WEIGHTICAEL: 4010.61 [oz_av]

## 2016-03-16 LAB — TROPONIN I: Troponin I: 4.83 ng/mL (ref <0.03)

## 2016-03-16 LAB — HEPARIN LEVEL (UNFRACTIONATED)
Heparin Unfractionated: 0.4 IU/mL (ref 0.30–0.70)
Heparin Unfractionated: 0.25 IU/mL — ABNORMAL LOW (ref 0.30–0.70)

## 2016-03-16 SURGERY — CORONARY/GRAFT ANGIOGRAPHY
Anesthesia: Moderate Sedation

## 2016-03-16 MED ORDER — LISINOPRIL 20 MG PO TABS
40.0000 mg | ORAL_TABLET | Freq: Every day | ORAL | Status: DC
  Administered 2016-03-17: 40 mg via ORAL
  Filled 2016-03-16: qty 2

## 2016-03-16 MED ORDER — MIDAZOLAM HCL 2 MG/2ML IJ SOLN
INTRAMUSCULAR | Status: DC | PRN
  Administered 2016-03-16 (×2): 1 mg via INTRAVENOUS

## 2016-03-16 MED ORDER — SODIUM CHLORIDE 0.9 % IV SOLN: 250.0000 mL | INTRAVENOUS | Status: DC | PRN

## 2016-03-16 MED ORDER — ISOSORBIDE MONONITRATE ER 30 MG PO TB24
30.0000 mg | ORAL_TABLET | Freq: Every day | ORAL | Status: DC
  Administered 2016-03-17: 30 mg via ORAL
  Filled 2016-03-16: qty 1

## 2016-03-16 MED ORDER — MIDAZOLAM HCL 2 MG/2ML IJ SOLN
INTRAMUSCULAR | Status: AC
  Filled 2016-03-16: qty 2

## 2016-03-16 MED ORDER — ASPIRIN 81 MG PO CHEW: 81.0000 mg | CHEWABLE_TABLET | ORAL | Status: DC

## 2016-03-16 MED ORDER — SODIUM CHLORIDE 0.9% FLUSH
3.0000 mL | Freq: Two times a day (BID) | INTRAVENOUS | Status: DC
  Administered 2016-03-16: 3 mL via INTRAVENOUS

## 2016-03-16 MED ORDER — IOPAMIDOL (ISOVUE-300) INJECTION 61%
INTRAVENOUS | Status: DC | PRN
  Administered 2016-03-16: 175 mL via INTRA_ARTERIAL

## 2016-03-16 MED ORDER — FENTANYL CITRATE (PF) 100 MCG/2ML IJ SOLN
INTRAMUSCULAR | Status: AC
  Filled 2016-03-16: qty 2

## 2016-03-16 MED ORDER — HEPARIN (PORCINE) IN NACL 2-0.9 UNIT/ML-% IJ SOLN
INTRAMUSCULAR | Status: AC
  Filled 2016-03-16: qty 500

## 2016-03-16 MED ORDER — ONDANSETRON HCL 4 MG/2ML IJ SOLN: 4.0000 mg | Freq: Four times a day (QID) | INTRAMUSCULAR | Status: DC | PRN

## 2016-03-16 MED ORDER — FENTANYL CITRATE (PF) 100 MCG/2ML IJ SOLN
INTRAMUSCULAR | Status: DC | PRN
  Administered 2016-03-16 (×2): 50 ug via INTRAVENOUS

## 2016-03-16 MED ORDER — SODIUM CHLORIDE 0.9 % WEIGHT BASED INFUSION
3.0000 mL/kg/h | INTRAVENOUS | Status: DC
Start: 2016-03-17 — End: 2016-03-16

## 2016-03-16 MED ORDER — ISOSORBIDE MONONITRATE 15 MG HALF TABLET: 20.0000 mg | ORAL_TABLET | Freq: Every day | ORAL | Status: DC

## 2016-03-16 MED ORDER — SODIUM CHLORIDE 0.9% FLUSH: 3.0000 mL | INTRAVENOUS | Status: DC | PRN

## 2016-03-16 MED ORDER — SODIUM CHLORIDE 0.9 % IV SOLN: INTRAVENOUS | Status: AC

## 2016-03-16 MED ORDER — ACETAMINOPHEN 325 MG PO TABS: 650.0000 mg | ORAL_TABLET | ORAL | Status: DC | PRN

## 2016-03-16 MED ORDER — ATORVASTATIN CALCIUM 20 MG PO TABS
40.0000 mg | ORAL_TABLET | Freq: Every day | ORAL | Status: DC
  Administered 2016-03-16: 40 mg via ORAL
  Filled 2016-03-16: qty 2

## 2016-03-16 MED ORDER — SODIUM CHLORIDE 0.9 % WEIGHT BASED INFUSION: 1.0000 mL/kg/h | INTRAVENOUS | Status: DC

## 2016-03-16 MED ORDER — SIMVASTATIN 20 MG PO TABS: 40.0000 mg | ORAL_TABLET | Freq: Every day | ORAL | Status: DC

## 2016-03-16 MED ORDER — CARVEDILOL 12.5 MG PO TABS
25.0000 mg | ORAL_TABLET | Freq: Two times a day (BID) | ORAL | Status: DC
  Administered 2016-03-17: 25 mg via ORAL
  Filled 2016-03-16: qty 1

## 2016-03-16 SURGICAL SUPPLY — 13 items
CATH INFINITI 5 FR IM (CATHETERS) ×2 IMPLANT
CATH INFINITI 5 FR RCB (CATHETERS) ×2 IMPLANT
CATH INFINITI 5FR ANG PIGTAIL (CATHETERS) ×2 IMPLANT
CATH INFINITI 5FR JL4 (CATHETERS) ×2 IMPLANT
CATH INFINITI 5FR JL5 (CATHETERS) ×2 IMPLANT
CATH INFINITI JR4 5F (CATHETERS) ×2 IMPLANT
DEVICE CLOSURE MYNXGRIP 5F (Vascular Products) ×2 IMPLANT
KIT MANI 3VAL PERCEP (MISCELLANEOUS) ×2 IMPLANT
NEEDLE PERC 18GX7CM (NEEDLE) ×2 IMPLANT
PACK CARDIAC CATH (CUSTOM PROCEDURE TRAY) ×2 IMPLANT
SHEATH PINNACLE 5F 10CM (SHEATH) ×2 IMPLANT
WIRE EMERALD 3MM-J .035X150CM (WIRE) ×2 IMPLANT
WIRE EMERALD 3MM-J .035X260CM (WIRE) ×2 IMPLANT

## 2016-03-16 NOTE — Progress Notes (Signed)
[facility] Encounter Note  Patient: Joseph Schmitt / Admit Date: 03/15/2016 / Date of Encounter: 03/16/2016, 5:38 PM   Subjective: No further chest pain overnight. Patient is mildly short of breath. He troponin consistent with non-ST elevation myocardial infarction Cardiac catheterization and echocardiogram showing normal LV systolic function with ejection fraction of 45-50% Occluded left anterior descending artery , ramus artery, obtuse marginal 3 artery, right coronary artery Nearly occluded with thrombus saphenous vein graft to ramus and/or obtuse marginal 1 artery Patent saphenous vein graft to right coronary artery, LIMA to the LAD, saphenous vein graft to obtuse marginal 3, saphenous vein graft to diagonal 1  Review of Systems: Positive for: Shortness of breath Negative for: Vision change, hearing change, syncope, dizziness, nausea, vomiting,diarrhea, bloody stool, stomach pain, cough, congestion, diaphoresis, urinary frequency, urinary pain,skin lesions, skin rashes Others previously listed  Objective: Telemetry: Normal sinus rhythm Physical Exam: Blood pressure (!) 182/106, pulse 74, temperature 98.8 F (37.1 C), resp. rate 19, height 5\' 11"  (1.803 m), weight 250 lb (113.4 kg), SpO2 96 %. Body mass index is 34.87 kg/m. General: Well developed, well nourished, in no acute distress. Head: Normocephalic, atraumatic, sclera non-icteric, no xanthomas, nares are without discharge. Neck: No apparent masses Lungs: Normal respirations with no wheezes, no rhonchi, no rales , no crackles   Heart: Regular rate and rhythm, normal S1 S2, no murmur, no rub, no gallop, PMI is normal size and placement, carotid upstroke normal without bruit, jugular venous pressure normal Abdomen: Soft, non-tender, non-distended with normoactive bowel sounds. No hepatosplenomegaly. Abdominal aorta is normal size without bruit Extremities: No edema, no clubbing, no cyanosis, no ulcers,   Peripheral: 2+ radial, 2+ femoral, 2+ dorsal pedal pulses Neuro: Alert and oriented. Moves all extremities spontaneously. Psych:  Responds to questions appropriately with a normal affect.   Intake/Output Summary (Last 24 hours) at 03/16/16 1738 Last data filed at 03/16/16 1712  Gross per 24 hour  Intake          2022.37 ml  Output             3590 ml  Net         -1567.63 ml    Inpatient Medications:  . [MAR Hold] antiseptic oral rinse  7 mL Mouth Rinse BID  . [MAR Hold] aspirin  81 mg Oral Daily  . [START ON 03/17/2016] aspirin  81 mg Oral Pre-Cath  . [MAR Hold] atorvastatin  40 mg Oral q1800  . [MAR Hold] carvedilol  25 mg Oral BID WC  . [MAR Hold] lisinopril  40 mg Oral Daily  . sodium chloride flush  3 mL Intravenous Q12H   Infusions:  . sodium chloride 75 mL/hr at 03/16/16 1400  . [START ON 03/17/2016] sodium chloride     Followed by  . [START ON 03/17/2016] sodium chloride    . heparin 1,400 Units/hr (03/16/16 1400)  . nitroGLYCERIN 70 mcg/min (03/16/16 1400)    Labs:  Recent Labs  03/15/16 0848 03/16/16 0431  NA 137 135  K 3.6 3.5  CL 105 103  CO2 25 26  GLUCOSE 130* 125*  BUN 20 13  CREATININE 0.99 0.74  CALCIUM 9.2 8.7*   No results for input(s): AST, ALT, ALKPHOS, BILITOT, PROT, ALBUMIN in the last 72 hours.  Recent Labs  03/15/16 0848 03/16/16 0431  WBC 8.4 11.3*  HGB 15.9 14.2  HCT 45.9 40.8  MCV 88.5 87.9  PLT 231 231    Recent Labs  03/15/16 0848 03/15/16 1116  03/15/16 1706 03/15/16 2327  TROPONINI 0.18* 0.44* 2.43* 4.83*   Invalid input(s): POCBNP  Recent Labs  03/15/16 1116  HGBA1C 5.7     Weights: Filed Weights   03/16/16 0500 03/16/16 0540 03/16/16 1454  Weight: 250 lb 7.1 oz (113.6 kg) 250 lb 10.6 oz (113.7 kg) 250 lb (113.4 kg)     Radiology/Studies:  Dg Chest 2 View  Result Date: 03/15/2016 CLINICAL DATA:  Right arm numbness. EXAM: CHEST  2 VIEW COMPARISON:  09/21/2008 FINDINGS: Postsurgical changes from CABG is  seen. Cardiomediastinal silhouette is normal. Mediastinal contours appear intact. The thoracic aorta is torturous and contains atherosclerotic calcifications at the arch. There is no evidence of focal airspace consolidation, pleural effusion or pneumothorax. Osseous structures are without acute abnormality. Soft tissues are grossly normal. IMPRESSION: No active cardiopulmonary disease. Tortuosity and atherosclerotic disease of the aorta. Electronically Signed   By: Fidela Salisbury M.D.   On: 03/15/2016 09:06     Assessment and Recommendation  60 y.o. male with known coronary artery disease status post coronary artery bypass graft essential hypertension mixed hyperlipidemia with non-ST elevation myocardial infarction with cardiac catheterization showing newly occluded with significant thrombus of the saphenous vein graft to obtuse marginal 1 not amenable to PCI and thrombectomy 1. Discontinuation of heparin due to no amenable PCI and stent placement 2. Aspirin and Plavix for further risk reduction cardiovascular event and other coronary atherosclerosis status post myocardial infarction 3. Beta blocker and ACE inhibitor for myocardial infarction hypertension control and renal protection with diabetes 4. High intensity cholesterol therapy with atorvastatin 5. Begin ambulation and follow for any further significant symptoms and possible use of nitrates if patient has recurrent evidence of chest discomfort 6. Patient has been instructed on the use of cardiac rehabilitation  Signed, Serafina Royals M.D. FACC

## 2016-03-16 NOTE — Progress Notes (Signed)
Report to [facility]  Check right groin for bleeding or hematoma.  Patient will be on bedrest for 1 hours post sheath pull---out of bed at 17:45.  Bilateral pulses are 2's DP's.[name]

## 2016-03-16 NOTE — Progress Notes (Signed)
[place] [facility] [place] NAME: Joseph Schmitt    MR#:  [id number]  DATE OF BIRTH:  [birth year]  SUBJECTIVE:   Upset he did not undergo cath yesterday No arm pain   REVIEW OF SYSTEMS:    Review of Systems  Constitutional: Negative.  Negative for chills, fever and malaise/fatigue.  HENT: Negative for ear discharge, ear pain, hearing loss, nosebleeds and sore throat.   Eyes: Negative.  Negative for blurred vision and pain.  Respiratory: Negative.  Negative for cough, hemoptysis, shortness of breath and wheezing.   Cardiovascular: Negative.  Negative for chest pain, palpitations and leg swelling.  Gastrointestinal: Negative.  Negative for abdominal pain, blood in stool, diarrhea, nausea and vomiting.  Genitourinary: Negative.  Negative for dysuria.  Musculoskeletal: Negative.  Negative for back pain.  Skin: Negative.   Neurological: Positive for headaches. Negative for dizziness, tremors, speech change, focal weakness and seizures.  Endo/Heme/Allergies: Negative.  Does not bruise/bleed easily.  Psychiatric/Behavioral: Negative.  Negative for depression, hallucinations and suicidal ideas.    Tolerating Diet:yes clears      DRUG ALLERGIES:  No Known Allergies  VITALS:  Blood pressure (!) 153/92, pulse 83, temperature 98.8 F (37.1 C), temperature source Oral, resp. rate (!) 21, height 5\' 11"  (1.803 m), weight 113.7 kg (250 lb 10.6 oz), SpO2 95 %.  PHYSICAL EXAMINATION:   Physical Exam  Constitutional: He is oriented to person, place, and time and well-developed, well-nourished, and in no distress. No distress.  HENT:  Head: Normocephalic.  Eyes: No scleral icterus.  Neck: Normal range of motion. Neck supple. No JVD present. No tracheal deviation present.  Cardiovascular: Normal rate, regular rhythm and normal heart sounds.  Exam reveals no gallop and no friction rub.   No murmur heard. Pulmonary/Chest: Effort normal and breath  sounds normal. No respiratory distress. He has no wheezes. He has no rales. He exhibits no tenderness.  Abdominal: Soft. Bowel sounds are normal. He exhibits no distension and no mass. There is no tenderness. There is no rebound and no guarding.  Musculoskeletal: Normal range of motion. He exhibits no edema.  Neurological: He is alert and oriented to person, place, and time.  Skin: Skin is warm. No rash noted. No erythema.  Psychiatric: Affect and judgment normal.      LABORATORY PANEL:   CBC  Recent Labs Lab 03/16/16 0431  WBC 11.3*  HGB 14.2  HCT 40.8  PLT 231   ------------------------------------------------------------------------------------------------------------------  Chemistries   Recent Labs Lab 03/16/16 0431  NA 135  K 3.5  CL 103  CO2 26  GLUCOSE 125*  BUN 13  CREATININE 0.74  CALCIUM 8.7*   ------------------------------------------------------------------------------------------------------------------  Cardiac Enzymes  Recent Labs Lab 03/15/16 1116 03/15/16 1706 03/15/16 2327  TROPONINI 0.44* 2.43* 4.83*   ------------------------------------------------------------------------------------------------------------------  RADIOLOGY:  Dg Chest 2 View  Result Date: 03/15/2016 CLINICAL DATA:  Right arm numbness. EXAM: CHEST  2 VIEW COMPARISON:  09/21/2008 FINDINGS: Postsurgical changes from CABG is seen. Cardiomediastinal silhouette is normal. Mediastinal contours appear intact. The thoracic aorta is torturous and contains atherosclerotic calcifications at the arch. There is no evidence of focal airspace consolidation, pleural effusion or pneumothorax. Osseous structures are without acute abnormality. Soft tissues are grossly normal. IMPRESSION: No active cardiopulmonary disease. Tortuosity and atherosclerotic disease of the aorta. Electronically Signed   By: Fidela Salisbury M.D.   On: 03/15/2016 09:06     ASSESSMENT AND PLAN:   60 year old  male with known coronary artery disease  status post 5 vessel CABG and essential hypertension who is been noncompliant with medications who presented with arm pain and found to have non-ST elevation MI.   1. Non-ST elevation MI: Troponin max is 4.83. Continue heparin drip. Nitroglycerin should be weaned off this patient is complaining of a severe headache. Patient is to undergo cardiac catheterization this afternoon. Continue lisinopril and Coreg.  Continue high intensity statin.   2. Hyperlipidemia: LDL is 144. Hgh intensity statin. Neck the patient will need repeat fasting lipids in 3 months.  3. Accelerated hypertension in the setting of problem #1 Increase Coreg and  Lisinopril.  Management plans discussed with the patient and he is in agreement.  CODE STATUS: FULL  TOTAL TIME TAKING CARE OF THIS PATIENT: 30 minutes.     POSSIBLE D/C 2-3 days, DEPENDING ON CLINICAL CONDITION.   Joe Gee M.D on 03/16/2016 at 1:12 PM  Between 7am to 6pm - Pager - [phone] After 6pm go to [email or web address] - password [email or web address] [facility]  Office  [phone]  CC: Primary care physician; No PCP Per Patient  Note: This dictation was prepared with Dragon dictation along with smaller phrase technology. Any transcriptional errors that result from this process are unintentional.

## 2016-03-16 NOTE — Progress Notes (Signed)
ANTICOAGULATION CONSULT NOTE - Initial Consult  Pharmacy Consult for Heparin Indication: chest pain/ACS  No Known Allergies  Patient Measurements: Height: 5\' 11"  (180.3 cm) Weight: 250 lb 10.6 oz (113.7 kg) IBW/kg (Calculated) : 75.3 Heparin Dosing Weight: 98.8 kg  Vital Signs: Temp: 98.8 F (37.1 C) (08/22 0800) Temp Source: Oral (08/22 0800) BP: 153/92 (08/22 0800) Pulse Rate: 83 (08/22 0900)  Labs:  Recent Labs  03/15/16 0848 03/15/16 1116  03/15/16 1706 03/15/16 2057 03/15/16 2327 03/16/16 0431 03/16/16 1046  HGB 15.9  --   --   --   --   --  14.2  --   HCT 45.9  --   --   --   --   --  40.8  --   PLT 231  --   --   --   --   --  231  --   APTT 35  --   --   --   --   --   --   --   LABPROT 13.7  --   --   --   --   --   --   --   INR 1.05  --   --   --   --   --   --   --   HEPARINUNFRC  --   --   < >  --  0.26*  --  0.40 0.25*  CREATININE 0.99  --   --   --   --   --  0.74  --   TROPONINI 0.18* 0.44*  --  2.43*  --  4.83*  --   --   < > = values in this interval not displayed.  Estimated Creatinine Clearance: 124.4 mL/min (by C-G formula based on SCr of 0.8 mg/dL).   Medical History: Past Medical History:  Diagnosis Date  . Heart attack ([place])   . Hypercholesteremia   . Hypertension     Medications:  Infusions:  . sodium chloride 75 mL/hr at 03/16/16 0900  . [START ON 03/17/2016] sodium chloride     Followed by  . [START ON 03/17/2016] sodium chloride    . heparin 1,200 Units/hr (03/16/16 0900)  . nitroGLYCERIN 70 mcg/min (03/16/16 0900)    Assessment: 60 yo male admitted with history of Cardiac Arrest presents with right arm numbness, and EKG ST elevation.  Goal of Therapy:  Heparin level 0.3-0.7 units/ml Monitor platelets by anticoagulation protocol: Yes   Plan:  HL= 0.25. Will not bolus as patient is scheduled for cath soon. Will increase infusion to 1400 units/hr and f/u after cath.   Ulice Dash D 03/16/2016,12:49 PM   .

## 2016-03-16 NOTE — Care Management (Signed)
Met with patient and his wife to discuss medications needs. I stressed the importance of taking prescribed meds- they agreed. Patient works full-time through city of [employer]. He has health insurance through his employer. However, some of his meds are not covered under his insurance plan. I advised wife/patient to check out [email or web address] to search for cheapest pharmacy. I also advised patient to talk to [facility] as they are usually competitive with prices. I advised patient to talk to his HR department also. I told him that he would get a list of discharge meds that he can call his insurance company to find out which meds are covered and talk to PCP/Cards about changing the ones that are not covered. Patient/wife agreed. No further RNCM needs.

## 2016-03-17 ENCOUNTER — Encounter: Payer: Self-pay | Admitting: Internal Medicine

## 2016-03-17 ENCOUNTER — Observation Stay
Admission: EM | Admit: 2016-03-17 | Discharge: 2016-03-18 | Disposition: A | Discharge status: Home / Self Care | Payer: 59 | Attending: Internal Medicine | Admitting: Internal Medicine

## 2016-03-17 DIAGNOSIS — Z9889 Other specified postprocedural states: Secondary | ICD-10-CM | POA: Insufficient documentation

## 2016-03-17 DIAGNOSIS — R55 Syncope and collapse: Principal | ICD-10-CM | POA: Diagnosis present

## 2016-03-17 DIAGNOSIS — Z9114 Patient's other noncompliance with medication regimen: Secondary | ICD-10-CM | POA: Insufficient documentation

## 2016-03-17 DIAGNOSIS — Z7982 Long term (current) use of aspirin: Secondary | ICD-10-CM | POA: Insufficient documentation

## 2016-03-17 DIAGNOSIS — Z951 Presence of aortocoronary bypass graft: Secondary | ICD-10-CM | POA: Insufficient documentation

## 2016-03-17 DIAGNOSIS — E860 Dehydration: Secondary | ICD-10-CM | POA: Insufficient documentation

## 2016-03-17 DIAGNOSIS — I214 Non-ST elevation (NSTEMI) myocardial infarction: Secondary | ICD-10-CM | POA: Insufficient documentation

## 2016-03-17 DIAGNOSIS — Z7902 Long term (current) use of antithrombotics/antiplatelets: Secondary | ICD-10-CM | POA: Insufficient documentation

## 2016-03-17 DIAGNOSIS — I10 Essential (primary) hypertension: Secondary | ICD-10-CM | POA: Insufficient documentation

## 2016-03-17 DIAGNOSIS — E7800 Pure hypercholesterolemia, unspecified: Secondary | ICD-10-CM | POA: Insufficient documentation

## 2016-03-17 DIAGNOSIS — I2510 Atherosclerotic heart disease of native coronary artery without angina pectoris: Secondary | ICD-10-CM | POA: Insufficient documentation

## 2016-03-17 LAB — BASIC METABOLIC PANEL
ANION GAP: 6 (ref 5–15)
Anion gap: 7 (ref 5–15)
BUN: 15 mg/dL (ref 6–20)
BUN: 18 mg/dL (ref 6–20)
CHLORIDE: 105 mmol/L (ref 101–111)
CHLORIDE: 105 mmol/L (ref 101–111)
CO2: 28 mmol/L (ref 22–32)
CO2: 24 mmol/L (ref 22–32)
CREATININE: 0.99 mg/dL (ref 0.61–1.24)
Calcium: 8.8 mg/dL — ABNORMAL LOW (ref 8.9–10.3)
Calcium: 8.8 mg/dL — ABNORMAL LOW (ref 8.9–10.3)
Creatinine, Ser: 1.04 mg/dL (ref 0.61–1.24)
GFR calc Af Amer: >60 mL/min (ref >60)
GFR calc non Af Amer: >60 mL/min (ref >60)
GFR calc non Af Amer: >60 mL/min (ref >60)
Glucose, Bld: 99 mg/dL (ref 65–99)
Glucose, Bld: 113 mg/dL — ABNORMAL HIGH (ref 65–99)
POTASSIUM: 3.4 mmol/L — AB (ref 3.5–5.1)
POTASSIUM: 4.4 mmol/L (ref 3.5–5.1)
SODIUM: 139 mmol/L (ref 135–145)
Sodium: 136 mmol/L (ref 135–145)

## 2016-03-17 LAB — TROPONIN I
TROPONIN I: 5.02 ng/mL — AB (ref <0.03)
TROPONIN I: 5.76 ng/mL — AB (ref <0.03)
Troponin I: 4.84 ng/mL (ref <0.03)

## 2016-03-17 LAB — CBC
HEMATOCRIT: 40.7 % (ref 40.0–52.0)
HEMATOCRIT: 42.3 % (ref 40.0–52.0)
HEMOGLOBIN: 14.5 g/dL (ref 13.0–18.0)
HEMOGLOBIN: 14.6 g/dL (ref 13.0–18.0)
MCH: 31.3 pg (ref 26.0–34.0)
MCH: 30.5 pg (ref 26.0–34.0)
MCHC: 35.7 g/dL (ref 32.0–36.0)
MCHC: 34.5 g/dL (ref 32.0–36.0)
MCV: 87.7 fL (ref 80.0–100.0)
MCV: 88.3 fL (ref 80.0–100.0)
PLATELETS: 207 10*3/uL (ref 150–440)
Platelets: 225 10*3/uL (ref 150–440)
RBC: 4.64 MIL/uL (ref 4.40–5.90)
RBC: 4.79 MIL/uL (ref 4.40–5.90)
RDW: 14 % (ref 11.5–14.5)
RDW: 13.8 % (ref 11.5–14.5)
WBC: 9.3 10*3/uL (ref 3.8–10.6)
WBC: 11.9 10*3/uL — AB (ref 3.8–10.6)

## 2016-03-17 LAB — MAGNESIUM: MAGNESIUM: 2 mg/dL (ref 1.7–2.4)

## 2016-03-17 MED ORDER — NITROGLYCERIN 0.4 MG SL SUBL: 0.4000 mg | SUBLINGUAL_TABLET | SUBLINGUAL | 0 refills | Status: DC | PRN

## 2016-03-17 MED ORDER — OXYCODONE HCL 5 MG PO TABS: 5.0000 mg | ORAL_TABLET | ORAL | Status: DC | PRN

## 2016-03-17 MED ORDER — ENOXAPARIN SODIUM 40 MG/0.4ML ~~LOC~~ SOLN
40.0000 mg | SUBCUTANEOUS | Status: DC
  Administered 2016-03-17: 40 mg via SUBCUTANEOUS
  Filled 2016-03-17: qty 0.4

## 2016-03-17 MED ORDER — LISINOPRIL 40 MG PO TABS: 40.0000 mg | ORAL_TABLET | Freq: Every day | ORAL | 0 refills | Status: DC

## 2016-03-17 MED ORDER — ASPIRIN 81 MG PO CHEW
81.0000 mg | CHEWABLE_TABLET | Freq: Every day | ORAL | Status: DC
  Administered 2016-03-17 – 2016-03-18 (×2): 81 mg via ORAL
  Filled 2016-03-17 (×2): qty 1

## 2016-03-17 MED ORDER — CLOPIDOGREL BISULFATE 75 MG PO TABS: 75.0000 mg | ORAL_TABLET | Freq: Every day | ORAL | 0 refills | Status: DC

## 2016-03-17 MED ORDER — CLOPIDOGREL BISULFATE 75 MG PO TABS
75.0000 mg | ORAL_TABLET | Freq: Every day | ORAL | Status: DC
  Administered 2016-03-17 – 2016-03-18 (×2): 75 mg via ORAL
  Filled 2016-03-17 (×2): qty 1

## 2016-03-17 MED ORDER — ISOSORBIDE MONONITRATE ER 30 MG PO TB24: 30.0000 mg | ORAL_TABLET | Freq: Every day | ORAL | 0 refills | Status: DC

## 2016-03-17 MED ORDER — LISINOPRIL 20 MG PO TABS
40.0000 mg | ORAL_TABLET | Freq: Every day | ORAL | Status: DC
  Administered 2016-03-18: 40 mg via ORAL
  Filled 2016-03-17: qty 2

## 2016-03-17 MED ORDER — METOPROLOL TARTRATE 50 MG PO TABS
50.0000 mg | ORAL_TABLET | Freq: Two times a day (BID) | ORAL | Status: DC
  Administered 2016-03-17 – 2016-03-18 (×2): 50 mg via ORAL
  Filled 2016-03-17 (×2): qty 1

## 2016-03-17 MED ORDER — NITROGLYCERIN 0.4 MG SL SUBL: 0.4000 mg | SUBLINGUAL_TABLET | SUBLINGUAL | Status: DC | PRN

## 2016-03-17 MED ORDER — ACETAMINOPHEN 325 MG PO TABS: 650.0000 mg | ORAL_TABLET | Freq: Four times a day (QID) | ORAL | Status: DC | PRN

## 2016-03-17 MED ORDER — ONDANSETRON HCL 4 MG PO TABS: 4.0000 mg | ORAL_TABLET | Freq: Four times a day (QID) | ORAL | Status: DC | PRN

## 2016-03-17 MED ORDER — ATORVASTATIN CALCIUM 40 MG PO TABS: 40.0000 mg | ORAL_TABLET | Freq: Every day | ORAL | 0 refills | Status: DC

## 2016-03-17 MED ORDER — ISOSORBIDE MONONITRATE ER 30 MG PO TB24
30.0000 mg | ORAL_TABLET | Freq: Every day | ORAL | Status: DC
  Administered 2016-03-17 – 2016-03-18 (×2): 30 mg via ORAL
  Filled 2016-03-17 (×2): qty 1

## 2016-03-17 MED ORDER — HYDROCHLOROTHIAZIDE 25 MG PO TABS
25.0000 mg | ORAL_TABLET | Freq: Every day | ORAL | Status: DC
  Administered 2016-03-17: 25 mg via ORAL
  Filled 2016-03-17: qty 1

## 2016-03-17 MED ORDER — ATORVASTATIN CALCIUM 20 MG PO TABS
40.0000 mg | ORAL_TABLET | Freq: Every day | ORAL | Status: DC
  Administered 2016-03-17: 40 mg via ORAL
  Filled 2016-03-17: qty 2

## 2016-03-17 MED ORDER — ASPIRIN 81 MG PO CHEW: 81.0000 mg | CHEWABLE_TABLET | Freq: Every day | ORAL | 0 refills | Status: DC

## 2016-03-17 MED ORDER — METOPROLOL TARTRATE 50 MG PO TABS: 50.0000 mg | ORAL_TABLET | Freq: Two times a day (BID) | ORAL | 0 refills | Status: DC

## 2016-03-17 MED ORDER — HYDROCHLOROTHIAZIDE 25 MG PO TABS: 25.0000 mg | ORAL_TABLET | Freq: Every day | ORAL | 0 refills | Status: DC

## 2016-03-17 MED ORDER — ONDANSETRON HCL 4 MG/2ML IJ SOLN: 4.0000 mg | Freq: Four times a day (QID) | INTRAMUSCULAR | Status: DC | PRN

## 2016-03-17 MED ORDER — ACETAMINOPHEN 650 MG RE SUPP: 650.0000 mg | Freq: Four times a day (QID) | RECTAL | Status: DC | PRN

## 2016-03-17 NOTE — Progress Notes (Signed)
Patient discharged, but experienced syncopal episode while leaving and has been readmitted to monitor for any possible arrhythmias. Cardiology consult called. Pt is stable sinus rhythm with vitals within normal limits. No pain, no dizziness.

## 2016-03-17 NOTE — Discharge Summary (Signed)
[place] at [place] NAME: Joseph Schmitt    MR#:  [id number]  DATE OF BIRTH:  01-03-1956  DATE OF ADMISSION:  03/17/2016 ADMITTING PHYSICIAN: No admitting provider for patient encounter.  DATE OF DISCHARGE: 03/17/2016  PRIMARY CARE PHYSICIAN: No PCP Per Patient    ADMISSION DIAGNOSIS:  syncope  DISCHARGE DIAGNOSIS:  NSTEMI   SECONDARY DIAGNOSIS:   Past Medical History:  Diagnosis Date  . Heart attack ([place])   . Hypercholesteremia   . Hypertension     HOSPITAL COURSE:   60 year old male with known coronary artery disease status post 5 vessel CABG and essential hypertension who is been noncompliant with medications who presented with arm pain and found to have non-ST elevation MI.   1. Non-ST elevation MI: Troponin max was 4.83. Patient was started on beta blocker, statin and heparin drip. Patient underwent cardiac catheterization and echocardiogram showing normal LV systolic function with ejection fraction of 45-50% Occluded left anterior descending artery , ramus artery, obtuse marginal 3 artery, right coronary artery Nearly occluded with thrombus saphenous vein graft to ramus and/or obtuse marginal 1 artery Patent saphenous vein graft to right coronary artery, LIMA to the LAD, saphenous vein graft to obtuse marginal 3, saphenous vein graft to diagonal 1. He was discharged on metoprolol, lisinopril, isosorbide, nitroglycerin sublingual when necessary, Plavix and aspirin. He was referred for cardiac rehabilitation. He will follow-up with cardiology in 1 week.    2. Hyperlipidemia: LDL is 144. Patient will continue with high intensity statin. Next line he will need repeat fasting lipids in 3 months.   3. Accelerated hypertension in the setting of problem #1: Patient was initially placed on nitroglycerin due to accelerated hypertension. He was discharged on beta blocker, ACE inhibitor, HCTZ and isosorbide.  4.  Electrolytes were repleted prior to discharge.    DISCHARGE CONDITIONS AND DIET:   Patient is stable for discharge on heart healthy diet  CONSULTS OBTAINED:    DRUG ALLERGIES:  No Known Allergies  DISCHARGE MEDICATIONS:   Current Discharge Medication List    CONTINUE these medications which have NOT CHANGED   Details  aspirin 81 MG chewable tablet Chew 1 tablet (81 mg total) by mouth daily. Qty: 120 tablet, Refills: 0    atorvastatin (LIPITOR) 40 MG tablet Take 1 tablet (40 mg total) by mouth daily at 6 PM. Qty: 30 tablet, Refills: 0    clopidogrel (PLAVIX) 75 MG tablet Take 1 tablet (75 mg total) by mouth daily. Qty: 30 tablet, Refills: 0    hydrochlorothiazide (HYDRODIURIL) 25 MG tablet Take 1 tablet (25 mg total) by mouth daily. Qty: 30 tablet, Refills: 0    isosorbide mononitrate (IMDUR) 30 MG 24 hr tablet Take 1 tablet (30 mg total) by mouth daily. Qty: 30 tablet, Refills: 0    lisinopril (PRINIVIL,ZESTRIL) 40 MG tablet Take 1 tablet (40 mg total) by mouth daily. Qty: 30 tablet, Refills: 0    metoprolol tartrate (LOPRESSOR) 50 MG tablet Take 1 tablet (50 mg total) by mouth 2 (two) times daily. Qty: 60 tablet, Refills: 0    nitroGLYCERIN (NITROSTAT) 0.4 MG SL tablet Place 1 tablet (0.4 mg total) under the tongue every 5 (five) minutes as needed for chest pain. Qty: 30 tablet, Refills: 0              Today   CHIEF COMPLAINT:   Patient without chest pain or shortness of breath ready for discharge.   VITAL SIGNS:  Blood pressure  138/72, pulse 66, temperature 98.4 F (36.9 C), temperature source Oral, resp. rate 15, height 5\' 11"  (1.803 m), weight 112 kg (247 lb), SpO2 95 %.   REVIEW OF SYSTEMS:  Review of Systems  Constitutional: Negative.  Negative for chills, fever and malaise/fatigue.  HENT: Negative.  Negative for ear discharge, ear pain, hearing loss, nosebleeds and sore throat.   Eyes: Negative.  Negative for blurred vision and pain.   Respiratory: Negative.  Negative for cough, hemoptysis, shortness of breath and wheezing.   Cardiovascular: Negative.  Negative for chest pain, palpitations and leg swelling.  Gastrointestinal: Negative.  Negative for abdominal pain, blood in stool, diarrhea, nausea and vomiting.  Genitourinary: Negative.  Negative for dysuria.  Musculoskeletal: Negative.  Negative for back pain.  Skin: Negative.   Neurological: Negative for dizziness, tremors, speech change, focal weakness, seizures and headaches.  Endo/Heme/Allergies: Negative.  Does not bruise/bleed easily.  Psychiatric/Behavioral: Negative.  Negative for depression, hallucinations and suicidal ideas.     PHYSICAL EXAMINATION:  GENERAL:  60 y.o.-year-old patient lying in the bed with no acute distress.  NECK:  Supple, no jugular venous distention. No thyroid enlargement, no tenderness.  LUNGS: Normal breath sounds bilaterally, no wheezing, rales,rhonchi  No use of accessory muscles of respiration.  CARDIOVASCULAR: S1, S2 normal. No murmurs, rubs, or gallops.  ABDOMEN: Soft, non-tender, non-distended. Bowel sounds present. No organomegaly or mass.  EXTREMITIES: No pedal edema, cyanosis, or clubbing.  PSYCHIATRIC: The patient is alert and oriented x 3.  SKIN: No obvious rash, lesion, or ulcer.   DATA REVIEW:   CBC  Recent Labs Lab 03/17/16 1236  WBC 11.9*  HGB 14.6  HCT 42.3  PLT 225    Chemistries   Recent Labs Lab 03/17/16 0406  NA 139  K 3.4*  CL 105  CO2 28  GLUCOSE 99  BUN 15  CREATININE 1.04  CALCIUM 8.8*    Cardiac Enzymes  Recent Labs Lab 03/15/16 1116 03/15/16 1706 03/15/16 2327  TROPONINI 0.44* 2.43* 4.83*    Microbiology Results  @[email or web address] @  RADIOLOGY:  No results found.    Management plans discussed with the patient and he is in agreement. Stable for discharge home  Patient should follow up with pcp and cardiology  CODE STATUS:  Code Status History    Date Active Date  Inactive Code Status Order ID Comments User Context   03/16/2016  6:08 PM 03/17/2016 11:58 AM Full Code [id number]  [name], MD Inpatient   03/15/2016 11:09 AM 03/15/2016  5:19 PM Full Code [id number]  Bettey Costa, MD Inpatient      TOTAL TIME TAKING CARE OF THIS PATIENT: 38 minutes.    Note: This dictation was prepared with Dragon dictation along with smaller phrase technology. Any transcriptional errors that result from this process are unintentional.  Regan Llorente M.D on 03/17/2016 at 1:55 PM  Between 7am to 6pm - Pager - [phone] After 6pm go to [email or web address] - password [email or web address] [facility]  Office  [phone]  CC: Primary care physician; No PCP Per Patient

## 2016-03-17 NOTE — Progress Notes (Signed)
[place] [facility] [place] was admitted to the Hospital on 03/15/2016 and Discharged  03/17/2016 and should be excused from work/school   For 7  days starting 03/15/2016 , may return to work/school without any restrictions.  Call Bettey Costa MD with questions.  Marieme Mcmackin M.D on 03/17/2016,at 10:00 AM  [place] at [facility]  [phone]

## 2016-03-17 NOTE — H&P (Signed)
[place] at [place] NAME: Joseph Schmitt    MR#:  [id number]  DATE OF BIRTH:  May 05, 1956   DATE OF ADMISSION:  03/17/2016  PRIMARY CARE PHYSICIAN: No PCP Per Patient   REQUESTING/REFERRING PHYSICIAN: Alfred Levins  CHIEF COMPLAINT:   Chief Complaint  Patient presents with  . Loss of Consciousness    HISTORY OF PRESENT ILLNESS:  Joseph Schmitt  is a 60 y.o. male with a known history of Coronary artery disease, recent NSTEMI discharge earlier today while the patient was waiting for his ride experienced syncopal episode. Denies chest pain palpitations further symptoms. He is currently asymptomatic single episode lasted about 15 seconds Case discussed with cardiology who felt observation for ventricular arrhythmia is warranted  PAST MEDICAL HISTORY:   Past Medical History:  Diagnosis Date  . Heart attack ([place])   . Hypercholesteremia   . Hypertension     PAST SURGICAL HISTORY:   Past Surgical History:  Procedure Laterality Date  . CARDIAC CATHETERIZATION N/A 03/16/2016   Procedure: Coronary/ grafts  Angiography;  Surgeon: [name], MD;  Location: [facility];  Service: Cardiovascular;  Laterality: N/A;  . CORONARY ARTERY BYPASS GRAFT      SOCIAL HISTORY:   Social History  Substance Use Topics  . Smoking status: Never Smoker  . Smokeless tobacco: Never Used  . Alcohol use No    FAMILY HISTORY:   Family History  Problem Relation Age of Onset  . Hypertension Other     DRUG ALLERGIES:  No Known Allergies  REVIEW OF SYSTEMS:  REVIEW OF SYSTEMS:  CONSTITUTIONAL: Denies fevers, chills, fatigue, weakness.  EYES: Denies blurred vision, double vision, or eye pain.  EARS, NOSE, THROAT: Denies tinnitus, ear pain, hearing loss.  RESPIRATORY: denies cough, shortness of breath, wheezing  CARDIOVASCULAR: Denies chest pain, palpitations, edema.  GASTROINTESTINAL: Denies nausea, vomiting, diarrhea, abdominal  pain.  GENITOURINARY: Denies dysuria, hematuria.  ENDOCRINE: Denies nocturia or thyroid problems. HEMATOLOGIC AND LYMPHATIC: Denies easy bruising or bleeding.  SKIN: Denies rash or lesions.  MUSCULOSKELETAL: Denies pain in neck, back, shoulder, knees, hips, or further arthritic symptoms.  NEUROLOGIC: Denies paralysis, paresthesias.  PSYCHIATRIC: Denies anxiety or depressive symptoms. Otherwise full review of systems performed by me is negative.   MEDICATIONS AT HOME:   Prior to Admission medications   Medication Sig Start Date End Date Taking? Authorizing Provider  aspirin 81 MG chewable tablet Chew 1 tablet (81 mg total) by mouth daily. 03/17/16  Yes Bettey Costa, MD  atorvastatin (LIPITOR) 40 MG tablet Take 1 tablet (40 mg total) by mouth daily at 6 PM. 03/17/16  Yes Sital Mody, MD  clopidogrel (PLAVIX) 75 MG tablet Take 1 tablet (75 mg total) by mouth daily. 03/17/16  Yes Bettey Costa, MD  hydrochlorothiazide (HYDRODIURIL) 25 MG tablet Take 1 tablet (25 mg total) by mouth daily. 03/17/16  Yes Bettey Costa, MD  isosorbide mononitrate (IMDUR) 30 MG 24 hr tablet Take 1 tablet (30 mg total) by mouth daily. 03/17/16  Yes Bettey Costa, MD  lisinopril (PRINIVIL,ZESTRIL) 40 MG tablet Take 1 tablet (40 mg total) by mouth daily. 03/17/16  Yes Bettey Costa, MD  metoprolol tartrate (LOPRESSOR) 50 MG tablet Take 1 tablet (50 mg total) by mouth 2 (two) times daily. 03/17/16  Yes Bettey Costa, MD  nitroGLYCERIN (NITROSTAT) 0.4 MG SL tablet Place 1 tablet (0.4 mg total) under the tongue every 5 (five) minutes as needed for chest pain. 03/17/16  Yes Sital Mody,  MD      VITAL SIGNS:  Blood pressure (!) 133/93, pulse 70, temperature 98.4 F (36.9 C), temperature source Oral, resp. rate 18, height 5\' 11"  (1.803 m), weight 112 kg (247 lb), SpO2 93 %.  PHYSICAL EXAMINATION:  VITAL SIGNS: Vitals:   03/17/16 1400 03/17/16 1430  BP: 112/82 (!) 133/93  Pulse: 72 70  Resp: (!) 23 18  Temp:     GENERAL:60 y.o.male  currently in no acute distress.  HEAD: Normocephalic, atraumatic.  EYES: Pupils equal, round, reactive to light. Extraocular muscles intact. No scleral icterus.  MOUTH: Moist mucosal membrane. Dentition intact. No abscess noted.  EAR, NOSE, THROAT: Clear without exudates. No external lesions.  NECK: Supple. No thyromegaly. No nodules. No JVD.  PULMONARY: Clear to ascultation, without wheeze rails or rhonci. No use of accessory muscles, Good respiratory effort. good air entry bilaterally CHEST: Nontender to palpation.  CARDIOVASCULAR: S1 and S2. Regular rate and rhythm. No murmurs, rubs, or gallops. No edema. Pedal pulses 2+ bilaterally.  GASTROINTESTINAL: Soft, nontender, nondistended. No masses. Positive bowel sounds. No hepatosplenomegaly.  MUSCULOSKELETAL: No swelling, clubbing, or edema. Range of motion full in all extremities.  NEUROLOGIC: Cranial nerves II through XII are intact. No gross focal neurological deficits. Sensation intact. Reflexes intact.  SKIN: No ulceration, lesions, rashes, or cyanosis. Skin warm and dry. Turgor intact.  PSYCHIATRIC: Mood, affect within normal limits. The patient is awake, alert and oriented x 3. Insight, judgment intact.    LABORATORY PANEL:   CBC  Recent Labs Lab 03/17/16 1236  WBC 11.9*  HGB 14.6  HCT 42.3  PLT 225   ------------------------------------------------------------------------------------------------------------------  Chemistries   Recent Labs Lab 03/17/16 1235  NA 136  K 4.4  CL 105  CO2 24  GLUCOSE 113*  BUN 18  CREATININE 0.99  CALCIUM 8.8*  MG 2.0   ------------------------------------------------------------------------------------------------------------------  Cardiac Enzymes  Recent Labs Lab 03/17/16 1235  TROPONINI 4.84*   ------------------------------------------------------------------------------------------------------------------  RADIOLOGY:  No results found.  EKG:   Orders placed or  performed during the hospital encounter of 03/17/16  . EKG 12-Lead  . EKG 12-Lead    IMPRESSION AND PLAN:   60 year old African-American gentleman history of essential hypertension, hyperlipidemia unspecified, coronary artery disease multivessel presenting after syncopal episode  1. Syncope: Observation Telemetry, cardiac enzymes, cardiology, check magnesium 2. Recent NSTEMI: Medical management aspirin and Plavix, Lipitor, beta blocker 3. Essential intention continue lisinopril as well as other medications will hold hydrochlorothiazide for now 4. Hyperlipidemia unspecified: Statin therapy    All the records are reviewed and case discussed with ED provider. Management plans discussed with the patient, family and they are in agreement.  CODE STATUS: Full  TOTAL TIME TAKING CARE OF THIS PATIENT: 33 minutes.    Prisila Dlouhy,  Karenann Cai.D on 03/17/2016 at 2:46 PM  Between 7am to 6pm - Pager - [phone]  After 6pm: House Pager: - [phone]  [facility]  Office  [phone]  CC: Primary care physician; No PCP Per Patient

## 2016-03-17 NOTE — ED Triage Notes (Signed)
Pt was being discharged on 2A after admission and heart cath when he had a syncopal episode with sweating, states he was given HCTZ before discharge, arrives awake and alert

## 2016-03-17 NOTE — Plan of Care (Signed)
Problem: Fluid Volume: Goal: Ability to maintain a balanced intake and output will improve Outcome: Completed/Met Date Met: 03/17/16 BP (!) 149/77   Pulse 73   Temp 98 F (36.7 C) (Oral)   Resp 19   Ht _0  (1.803 m)   Wt 115 kg (253 lb 8.5 oz)   SpO2 95%   BMI 35.36 kg/m  POC reviewed with patient, cont on q2 turns, vital signs closely monitored and any concerns addressed at this time. Will continue to monitor. Right femoral site is intact with gauze and tegaderm intact. No bruising on the site. Denies pain.

## 2016-03-17 NOTE — Progress Notes (Signed)
[facility] Encounter Note  Patient: Joseph Schmitt / Admit Date: 03/15/2016 / Date of Encounter: 03/17/2016, 8:07 AM   Subjective: No further chest pain overnight. Patient is mildly short of breath. He troponin consistent with non-ST elevation myocardial infarction Cardiac catheterization and echocardiogram showing normal LV systolic function with ejection fraction of 45-50% Occluded left anterior descending artery , ramus artery, obtuse marginal 3 artery, right coronary artery Nearly occluded with thrombus saphenous vein graft to ramus and/or obtuse marginal 1 artery Patent saphenous vein graft to right coronary artery, LIMA to the LAD, saphenous vein graft to obtuse marginal 3, saphenous vein graft to diagonal 1  Review of Systems: Positive for: Shortness of breath Negative for: Vision change, hearing change, syncope, dizziness, nausea, vomiting,diarrhea, bloody stool, stomach pain, cough, congestion, diaphoresis, urinary frequency, urinary pain,skin lesions, skin rashes Others previously listed  Objective: Telemetry: Normal sinus rhythm Physical Exam: Blood pressure (!) 153/90, pulse 80, temperature 98 F (36.7 C), temperature source Oral, resp. rate 18, height 5\' 11"  (1.803 m), weight 253 lb 8.5 oz (115 kg), SpO2 93 %. Body mass index is 35.36 kg/m. General: Well developed, well nourished, in no acute distress. Head: Normocephalic, atraumatic, sclera non-icteric, no xanthomas, nares are without discharge. Neck: No apparent masses Lungs: Normal respirations with no wheezes, no rhonchi, no rales , no crackles   Heart: Regular rate and rhythm, normal S1 S2, no murmur, no rub, no gallop, PMI is normal size and placement, carotid upstroke normal without bruit, jugular venous pressure normal Abdomen: Soft, non-tender, non-distended with normoactive bowel sounds. No hepatosplenomegaly. Abdominal aorta is normal size without bruit Extremities: No edema, no clubbing,  no cyanosis, no ulcers,  Peripheral: 2+ radial, 2+ femoral, 2+ dorsal pedal pulses Neuro: Alert and oriented. Moves all extremities spontaneously. Psych:  Responds to questions appropriately with a normal affect.   Intake/Output Summary (Last 24 hours) at 03/17/16 0807 Last data filed at 03/17/16 0741  Gross per 24 hour  Intake          1595.37 ml  Output             2850 ml  Net         -1254.63 ml    Inpatient Medications:  . antiseptic oral rinse  7 mL Mouth Rinse BID  . aspirin  81 mg Oral Daily  . atorvastatin  40 mg Oral q1800  . carvedilol  25 mg Oral BID WC  . isosorbide mononitrate  30 mg Oral Daily  . lisinopril  40 mg Oral Daily  . sodium chloride flush  3 mL Intravenous Q12H   Infusions:  . sodium chloride 75 mL/hr at 03/16/16 1400    Labs:  Recent Labs  03/16/16 0431 03/17/16 0406  NA 135 139  K 3.5 3.4*  CL 103 105  CO2 26 28  GLUCOSE 125* 99  BUN 13 15  CREATININE 0.74 1.04  CALCIUM 8.7* 8.8*   No results for input(s): AST, ALT, ALKPHOS, BILITOT, PROT, ALBUMIN in the last 72 hours.  Recent Labs  03/16/16 0431 03/17/16 0406  WBC 11.3* 9.3  HGB 14.2 14.5  HCT 40.8 40.7  MCV 87.9 87.7  PLT 231 207    Recent Labs  03/15/16 0848 03/15/16 1116 03/15/16 1706 03/15/16 2327  TROPONINI 0.18* 0.44* 2.43* 4.83*   Invalid input(s): POCBNP  Recent Labs  03/15/16 1116  HGBA1C 5.7     Weights: Filed Weights   03/16/16 0540 03/16/16 1454 03/17/16 0615  Weight: 250 lb  10.6 oz (113.7 kg) 250 lb (113.4 kg) 253 lb 8.5 oz (115 kg)     Radiology/Studies:  Dg Chest 2 View  Result Date: 03/15/2016 CLINICAL DATA:  Right arm numbness. EXAM: CHEST  2 VIEW COMPARISON:  09/21/2008 FINDINGS: Postsurgical changes from CABG is seen. Cardiomediastinal silhouette is normal. Mediastinal contours appear intact. The thoracic aorta is torturous and contains atherosclerotic calcifications at the arch. There is no evidence of focal airspace consolidation,  pleural effusion or pneumothorax. Osseous structures are without acute abnormality. Soft tissues are grossly normal. IMPRESSION: No active cardiopulmonary disease. Tortuosity and atherosclerotic disease of the aorta. Electronically Signed   By: Fidela Salisbury M.D.   On: 03/15/2016 09:06     Assessment and Recommendation  60 y.o. male with known coronary artery disease status post coronary artery bypass graft essential hypertension mixed hyperlipidemia with non-ST elevation myocardial infarction with cardiac catheterization showing newly occluded with significant thrombus of the saphenous vein graft to obtuse marginal 1 not amenable to PCI and thrombectomy 1. Discontinuation of heparin due to no amenable PCI and stent placement and tolerating well 2. Aspirin and Plavix for further risk reduction cardiovascular event and other coronary atherosclerosis status post myocardial infarction 3. Beta blocker and ACE inhibitor for myocardial infarction hypertension control and renal protection with diabetes 4. High intensity cholesterol therapy with atorvastatin 5. Begin ambulation and follow for any further significant symptoms and currently no need for nitrates due to no evidence of chest discomfort and/or other symptoms at this time 6. Patient has been instructed on the use of cardiac rehabilitation 7. Okay for discharge home from cardiac standpoint with follow-up in one to 2 weeks with Dr. Clayborn Bigness Signed, Serafina Royals M.D. FACC

## 2016-03-17 NOTE — ED Notes (Signed)
Attempted to call report

## 2016-03-17 NOTE — ED Provider Notes (Signed)
[facility] Emergency Department Provider Note  ____________________________________________  Time seen: Approximately 12:31 PM  I have reviewed the triage vital signs and the nursing notes.   HISTORY  Chief Complaint Loss of Consciousness   HPI Aleph Sparhawk is a 60 y.o. male with h/o severe CAD sp CABG who is being discharged from the hospital today after being admitted for NSTEMI and found to have severe multi vessel CAD/ occlusion not amenable to thrombectomy and/or PCI and/or stent who was brought into the emergency department after a syncopal episode. Patient was in the wheelchair at the lobby waiting for his wife to go get the car when he had a syncopal episode on the wheelchair. A code was called. Patient had LOC for a few seconds and regained consciousness immediately was back to his baseline. Patient denies any preceding symptoms to his syncopal episode. Patient was diaphoretic and pale when this episode happened and was witnessed by nursing and physician staff had responded to the code. Patient denies chest pain, palpitations, shortness of breath, back pain, abdominal pain, facial droop, headache, weakness or numbness of his extremities. He is at his baseline.  Past Medical History:  Diagnosis Date  . Heart attack ([place])   . Hypercholesteremia   . Hypertension     Patient Active Problem List   Diagnosis Date Noted  . Syncope 03/17/2016  . Elevated troponin 03/15/2016    Past Surgical History:  Procedure Laterality Date  . CARDIAC CATHETERIZATION N/A 03/16/2016   Procedure: Coronary/ grafts  Angiography;  Surgeon: [name], MD;  Location: [place] [facility];  Service: Cardiovascular;  Laterality: N/A;  . CORONARY ARTERY BYPASS GRAFT      Prior to Admission medications   Medication Sig Start Date End Date Taking? Authorizing Provider  aspirin 81 MG chewable tablet Chew 1 tablet (81 mg total) by mouth daily. 03/17/16  Yes Bettey Costa, MD  atorvastatin (LIPITOR) 40 MG tablet Take 1 tablet (40 mg total) by mouth daily at 6 PM. 03/17/16  Yes Sital Mody, MD  clopidogrel (PLAVIX) 75 MG tablet Take 1 tablet (75 mg total) by mouth daily. 03/17/16  Yes Bettey Costa, MD  hydrochlorothiazide (HYDRODIURIL) 25 MG tablet Take 1 tablet (25 mg total) by mouth daily. 03/17/16  Yes Bettey Costa, MD  isosorbide mononitrate (IMDUR) 30 MG 24 hr tablet Take 1 tablet (30 mg total) by mouth daily. 03/17/16  Yes Bettey Costa, MD  lisinopril (PRINIVIL,ZESTRIL) 40 MG tablet Take 1 tablet (40 mg total) by mouth daily. 03/17/16  Yes Bettey Costa, MD  metoprolol tartrate (LOPRESSOR) 50 MG tablet Take 1 tablet (50 mg total) by mouth 2 (two) times daily. 03/17/16  Yes Bettey Costa, MD  nitroGLYCERIN (NITROSTAT) 0.4 MG SL tablet Place 1 tablet (0.4 mg total) under the tongue every 5 (five) minutes as needed for chest pain. 03/17/16  Yes Bettey Costa, MD    Allergies Review of patient's allergies indicates no known allergies.  Family History  Problem Relation Age of Onset  . Hypertension Other     Social History Social History  Substance Use Topics  . Smoking status: Never Smoker  . Smokeless tobacco: Never Used  . Alcohol use No    Review of Systems  Constitutional: Negative for fever. + syncope Eyes: Negative for visual changes. ENT: Negative for sore throat. Cardiovascular: Negative for chest pain. Respiratory: Negative for shortness of breath. Gastrointestinal: Negative for abdominal pain, vomiting or diarrhea. Genitourinary: Negative for dysuria. Musculoskeletal: Negative for back  pain. Skin: Negative for rash. Neurological: Negative for headaches, weakness or numbness.  ____________________________________________   PHYSICAL EXAM:  VITAL SIGNS: ED Triage Vitals  Enc Vitals Group     BP 03/17/16 1208 126/86     Pulse Rate 03/17/16 1208 71     Resp 03/17/16 1208 18     Temp 03/17/16 1208 98.4 F (36.9 C)     Temp Source 03/17/16 1208  Oral     SpO2 03/17/16 1208 96 %     Weight 03/17/16 1209 247 lb (112 kg)     Height 03/17/16 1209 5\' 11"  (1.803 m)     Head Circumference --      Peak Flow --      Pain Score 03/17/16 1209 0     Pain Loc --      Pain Edu? --      Excl. in [place]? --     Constitutional: Alert and oriented. Well appearing and in no apparent distress. HEENT:      Head: Normocephalic and atraumatic.         Eyes: Conjunctivae are normal. Sclera is non-icteric. EOMI. PERRL      Mouth/Throat: Mucous membranes are moist.       Neck: Supple with no signs of meningismus. Cardiovascular: Regular rate and rhythm. No murmurs, gallops, or rubs. 2+ symmetrical distal pulses are present in all extremities. No JVD. Respiratory: Normal respiratory effort. Lungs are clear to auscultation bilaterally. No wheezes, crackles, or rhonchi.  Gastrointestinal: Soft, non tender, and non distended with positive bowel sounds. No rebound or guarding. Genitourinary: No CVA tenderness. Musculoskeletal: Nontender with normal range of motion in all extremities. No edema, cyanosis, or erythema of extremities. Neurologic: Normal speech and language. A & O x3, PERRL, no nystagmus, CN II-XII intact, motor testing reveals good tone and bulk throughout. There is no evidence of pronator drift or dysmetria. Muscle strength is 5/5 throughout. Deep tendon reflexes are 2+ throughout with downgoing toes. Sensory examination is intact. Gait is normal. Skin: Skin is warm, dry and intact. No rash noted. Psychiatric: Mood and affect are normal. Speech and behavior are normal.  ____________________________________________   LABS (all labs ordered are listed, but only abnormal results are displayed)  Labs Reviewed  TROPONIN I - Abnormal; Notable for the following:       Result Value   Troponin I 4.84 (*)    All other components within normal limits  BASIC METABOLIC PANEL - Abnormal; Notable for the following:    Glucose, Bld 113 (*)    Calcium 8.8  (*)    All other components within normal limits  CBC - Abnormal; Notable for the following:    WBC 11.9 (*)    All other components within normal limits  TROPONIN I - Abnormal; Notable for the following:    Troponin I 5.02 (*)    All other components within normal limits  TROPONIN I - Abnormal; Notable for the following:    Troponin I 5.76 (*)    All other components within normal limits  TROPONIN I - Abnormal; Notable for the following:    Troponin I 5.06 (*)    All other components within normal limits  MAGNESIUM   ____________________________________________  EKG  ED ECG REPORT I, Rudene Re, the attending physician, personally viewed and interpreted this ECG.  Normal sinus rhythm, rate of 69, normal intervals, normal axis, T-wave inversions on inferior and lateral leads which are old from prior. No ST elevations. ____________________________________________  [id number]  none ____________________________________________   PROCEDURES  Procedure(s) performed: None Procedures Critical Care performed:  None ____________________________________________   INITIAL IMPRESSION / ASSESSMENT AND PLAN / ED COURSE  60 y.o. male with h/o severe CAD sp CABG who is being discharged from the hospital today after being admitted for NSTEMI and found to have severe multi vessel CAD/ occlusion not amenable to thrombectomy and/or PCI and/or stent who was brought into the emergency department after a syncopal episode with no precedings symptoms in the lobby just after being discharged. Patient is at his baseline and has no complaints. S/p LHC yesterday. EKG unchanged from baseline. VS WNL. Will check labs and discuss with hospitalist/  Clinical Course  Comment By Time  Troponin 4.84, was 4.83 yesterday. Patient remains stable and asymptomatic however syncope with h/o of recent cath and CAD with no preceding symptoms makes me concerned for ventricular arrhythmia. Discussed with  Hospitalist for admission. Rudene Re, MD 08/23 1414   Spoke with Dr. Nehemiah Massed who agrees with admission for obs for syncope since patient is less than 24 hrs s/p LHC and no prior h/o syncope.  Pertinent labs & imaging results that were available during my care of the patient were reviewed by me and considered in my medical decision making (see chart for details).    ____________________________________________   FINAL CLINICAL IMPRESSION(S) / ED DIAGNOSES  Final diagnoses:  Syncope, unspecified syncope type      NEW MEDICATIONS STARTED DURING THIS VISIT:  Current Discharge Medication List       Note:  This document was prepared using Dragon voice recognition software and may include unintentional dictation errors.    Rudene Re, MD 03/18/16 1029

## 2016-03-17 NOTE — Progress Notes (Signed)
Pt does not have IV access. MD Jannifer Franklin notified. Per MD it is okay for pt to not have IV access.   Iran Sizer M

## 2016-03-18 ENCOUNTER — Inpatient Hospital Stay
Admission: RE | Admit: 2016-03-18 | Discharge: 2016-03-18 | Disposition: A | Discharge status: Home / Self Care | Payer: 59 | Source: Ambulatory Visit

## 2016-03-18 LAB — TROPONIN I: TROPONIN I: 5.06 ng/mL — AB (ref <0.03)

## 2016-03-18 NOTE — Progress Notes (Signed)
Dr. Benjie Karvonen called and stated exercise stress test was normal and patient can go home. Discharge instructions given to patient and instructed not to take hydrochlorothiazide that was prescribed yesterday. Patient will pick up other prescriptions from yesterday and call Dr. Charlotte Crumb office for an appointment. No IV in place, tele removed.

## 2016-03-18 NOTE — Discharge Summary (Deleted)
  The note originally documented on this encounter has been moved the the encounter in which it belongs.  

## 2016-03-18 NOTE — Discharge Summary (Signed)
[facility] [place] NAME: Joseph Schmitt    MR#:  [id number]  DATE OF BIRTH:  08-25-1955  DATE OF ADMISSION:  03/17/2016 ADMITTING PHYSICIAN: Bettey Costa, MD  DATE OF DISCHARGE: 03/18/2016  PRIMARY CARE PHYSICIAN: No PCP Per Patient    ADMISSION DIAGNOSIS:  Syncope, unspecified syncope type [R55]  DISCHARGE DIAGNOSIS:  Active Problems:   Syncope   SECONDARY DIAGNOSIS:   Past Medical History:  Diagnosis Date  . Heart attack ([place])   . Hypercholesteremia   . Hypertension     HOSPITAL COURSE:   60 year old male with non-STEMI who was discharge and subsequently had a syncopal episode. For further details please further H&P.  1. Syncope: This is most likely due to the fact the patient did not eat after his cardiac catheterization and was dehydrated. He was evaluated by cardiology. He underwent treadmill stress test which was negative.  2. Recent non-STEMI: Patient will continue medical management including aspirin, Plavix, statin and beta blocker. Elevation in troponin due to last myocardial infarction from recent hospitalization. 3. Essential hypertension: Patient will continue outpatient medications with the exception of HCTZ. Due to above issue.  4. Hyperlipidemia: Patient was initiated on statin during last hospitalization. He will need repeat lipid panel 3 months.   DISCHARGE CONDITIONS AND DIET:   Stable for discharge home on a cardiac diet  CONSULTS OBTAINED:  Treatment Team:  Lytle Butte, MD Teodoro Spray, MD  DRUG ALLERGIES:  No Known Allergies  DISCHARGE MEDICATIONS:   Current Discharge Medication List    CONTINUE these medications which have NOT CHANGED   Details  aspirin 81 MG chewable tablet Chew 1 tablet (81 mg total) by mouth daily. Qty: 120 tablet, Refills: 0    atorvastatin (LIPITOR) 40 MG tablet Take 1 tablet (40 mg total) by mouth daily at 6 PM. Qty: 30 tablet, Refills: 0    clopidogrel  (PLAVIX) 75 MG tablet Take 1 tablet (75 mg total) by mouth daily. Qty: 30 tablet, Refills: 0    isosorbide mononitrate (IMDUR) 30 MG 24 hr tablet Take 1 tablet (30 mg total) by mouth daily. Qty: 30 tablet, Refills: 0    lisinopril (PRINIVIL,ZESTRIL) 40 MG tablet Take 1 tablet (40 mg total) by mouth daily. Qty: 30 tablet, Refills: 0    metoprolol tartrate (LOPRESSOR) 50 MG tablet Take 1 tablet (50 mg total) by mouth 2 (two) times daily. Qty: 60 tablet, Refills: 0    nitroGLYCERIN (NITROSTAT) 0.4 MG SL tablet Place 1 tablet (0.4 mg total) under the tongue every 5 (five) minutes as needed for chest pain. Qty: 30 tablet, Refills: 0      STOP taking these medications     hydrochlorothiazide (HYDRODIURIL) 25 MG tablet               Today   CHIEF COMPLAINT:   Doing well this morning. Ambulated without any difficulty. No events on telemetry.  VITAL SIGNS:  Blood pressure (!) 148/82, pulse 70, temperature 98.1 F (36.7 C), temperature source Oral, resp. rate 16, height 5\' 11"  (1.803 m), weight 112.9 kg (249 lb), SpO2 97 %.   REVIEW OF SYSTEMS:  Review of Systems  Constitutional: Negative.  Negative for chills, fever and malaise/fatigue.  HENT: Negative.  Negative for ear discharge, ear pain, hearing loss, nosebleeds and sore throat.   Eyes: Negative.  Negative for blurred vision and pain.  Respiratory: Negative.  Negative for cough, hemoptysis, shortness of breath and wheezing.  Cardiovascular: Negative.  Negative for chest pain, palpitations and leg swelling.  Gastrointestinal: Negative.  Negative for abdominal pain, blood in stool, diarrhea, nausea and vomiting.  Genitourinary: Negative.  Negative for dysuria.  Musculoskeletal: Negative.  Negative for back pain.  Skin: Negative.   Neurological: Negative for dizziness, tremors, speech change, focal weakness, seizures and headaches.  Endo/Heme/Allergies: Negative.  Does not bruise/bleed easily.  Psychiatric/Behavioral:  Negative.  Negative for depression, hallucinations and suicidal ideas.     PHYSICAL EXAMINATION:  GENERAL:  60 y.o.-year-old patient lying in the bed with no acute distress.  NECK:  Supple, no jugular venous distention. No thyroid enlargement, no tenderness.  LUNGS: Normal breath sounds bilaterally, no wheezing, rales,rhonchi  No use of accessory muscles of respiration.  CARDIOVASCULAR: S1, S2 normal. No murmurs, rubs, or gallops.  ABDOMEN: Soft, non-tender, non-distended. Bowel sounds present. No organomegaly or mass.  EXTREMITIES: No pedal edema, cyanosis, or clubbing.  PSYCHIATRIC: The patient is alert and oriented x 3.  SKIN: No obvious rash, lesion, or ulcer.   DATA REVIEW:   CBC  Recent Labs Lab 03/17/16 1236  WBC 11.9*  HGB 14.6  HCT 42.3  PLT 225    Chemistries   Recent Labs Lab 03/17/16 1235  NA 136  K 4.4  CL 105  CO2 24  GLUCOSE 113*  BUN 18  CREATININE 0.99  CALCIUM 8.8*  MG 2.0    Cardiac Enzymes  Recent Labs Lab 03/17/16 1704 03/17/16 2310 03/18/16 0422  TROPONINI 5.02* 5.76* 5.06*    Microbiology Results  @[email or web address] @  RADIOLOGY:  No results found.    Management plans discussed with the patient and he is in agreement. Stable for discharge home  Patient should follow up with pcp  CODE STATUS:     Code Status Orders        Start     Ordered   03/17/16 1437  Full code  Continuous     03/17/16 1436    Code Status History    Date Active Date Inactive Code Status Order ID Comments User Context   03/16/2016  6:08 PM 03/17/2016 11:58 AM Full Code [id number]  [name], MD Inpatient   03/15/2016 11:09 AM 03/15/2016  5:19 PM Full Code [id number]  Bettey Costa, MD Inpatient      TOTAL TIME TAKING CARE OF THIS PATIENT: 35 minutes.    Note: This dictation was prepared with Dragon dictation along with smaller phrase technology. Any transcriptional errors that result from this process are unintentional.  Soha Thorup M.D on  03/18/2016 at 1:26 PM  Between 7am to 6pm - Pager - [phone] After 6pm go to [email or web address] - password [email or web address] [facility]  Office  [phone]  CC: Primary care physician; No PCP Per Patient

## 2016-03-22 LAB — EXERCISE TOLERANCE TEST
CHL CUP RESTING HR STRESS: 64 {beats}/min
CSEPED: 2 min
CSEPEW: 7.1 METS
Exercise duration (sec): 39 s
Peak HR: 118 {beats}/min

## 2018-03-23 DIAGNOSIS — E785 Hyperlipidemia, unspecified: Secondary | ICD-10-CM | POA: Diagnosis not present

## 2018-03-23 DIAGNOSIS — I25118 Atherosclerotic heart disease of native coronary artery with other forms of angina pectoris: Secondary | ICD-10-CM | POA: Diagnosis not present

## 2018-03-23 DIAGNOSIS — I10 Essential (primary) hypertension: Secondary | ICD-10-CM | POA: Diagnosis not present

## 2019-07-27 HISTORY — PX: CORONARY STENT PLACEMENT: SHX1402

## 2019-11-29 ENCOUNTER — Other Ambulatory Visit: Payer: 59

## 2019-11-30 ENCOUNTER — Encounter: Payer: Self-pay | Admitting: Internal Medicine

## 2019-11-30 ENCOUNTER — Other Ambulatory Visit
Admission: RE | Admit: 2019-11-30 | Discharge: 2019-11-30 | Disposition: A | Discharge status: Home / Self Care | Payer: 59 | Source: Ambulatory Visit | Attending: Internal Medicine | Admitting: Internal Medicine

## 2019-11-30 DIAGNOSIS — Z01812 Encounter for preprocedural laboratory examination: Secondary | ICD-10-CM | POA: Diagnosis not present

## 2019-11-30 DIAGNOSIS — Z20822 Contact with and (suspected) exposure to covid-19: Secondary | ICD-10-CM | POA: Insufficient documentation

## 2019-11-30 LAB — SARS CORONAVIRUS 2 (TAT 6-24 HRS): SARS Coronavirus 2: NEGATIVE

## 2019-12-03 ENCOUNTER — Encounter: Payer: Self-pay | Admitting: Internal Medicine

## 2019-12-03 ENCOUNTER — Ambulatory Visit: Payer: 59 | Admitting: Registered Nurse

## 2019-12-03 ENCOUNTER — Encounter
Admission: RE | Disposition: A | Discharge status: Home / Self Care | Payer: Self-pay | Source: Home / Self Care | Attending: Internal Medicine

## 2019-12-03 ENCOUNTER — Ambulatory Visit
Admission: RE | Admit: 2019-12-03 | Discharge: 2019-12-03 | Disposition: A | Discharge status: Home / Self Care | Payer: 59 | Attending: Internal Medicine | Admitting: Internal Medicine

## 2019-12-03 DIAGNOSIS — Z1211 Encounter for screening for malignant neoplasm of colon: Secondary | ICD-10-CM | POA: Insufficient documentation

## 2019-12-03 DIAGNOSIS — Z79899 Other long term (current) drug therapy: Secondary | ICD-10-CM | POA: Insufficient documentation

## 2019-12-03 DIAGNOSIS — Z951 Presence of aortocoronary bypass graft: Secondary | ICD-10-CM | POA: Insufficient documentation

## 2019-12-03 DIAGNOSIS — K640 First degree hemorrhoids: Secondary | ICD-10-CM | POA: Insufficient documentation

## 2019-12-03 DIAGNOSIS — Z86010 Personal history of colonic polyps: Secondary | ICD-10-CM | POA: Diagnosis not present

## 2019-12-03 DIAGNOSIS — K5730 Diverticulosis of large intestine without perforation or abscess without bleeding: Secondary | ICD-10-CM | POA: Diagnosis not present

## 2019-12-03 DIAGNOSIS — I10 Essential (primary) hypertension: Secondary | ICD-10-CM | POA: Insufficient documentation

## 2019-12-03 DIAGNOSIS — E7800 Pure hypercholesterolemia, unspecified: Secondary | ICD-10-CM | POA: Diagnosis not present

## 2019-12-03 DIAGNOSIS — Z7902 Long term (current) use of antithrombotics/antiplatelets: Secondary | ICD-10-CM | POA: Diagnosis not present

## 2019-12-03 DIAGNOSIS — Z7982 Long term (current) use of aspirin: Secondary | ICD-10-CM | POA: Insufficient documentation

## 2019-12-03 DIAGNOSIS — I2510 Atherosclerotic heart disease of native coronary artery without angina pectoris: Secondary | ICD-10-CM | POA: Diagnosis not present

## 2019-12-03 DIAGNOSIS — Z955 Presence of coronary angioplasty implant and graft: Secondary | ICD-10-CM | POA: Insufficient documentation

## 2019-12-03 DIAGNOSIS — I252 Old myocardial infarction: Secondary | ICD-10-CM | POA: Insufficient documentation

## 2019-12-03 HISTORY — DX: Chlamydial infection, unspecified: A74.9

## 2019-12-03 HISTORY — PX: COLONOSCOPY WITH PROPOFOL: SHX5780

## 2019-12-03 HISTORY — DX: Cardiac murmur, unspecified: R01.1

## 2019-12-03 HISTORY — DX: Atherosclerotic heart disease of native coronary artery without angina pectoris: I25.10

## 2019-12-03 SURGERY — COLONOSCOPY WITH PROPOFOL
Anesthesia: General

## 2019-12-03 MED ORDER — PROPOFOL 10 MG/ML IV BOLUS
INTRAVENOUS | Status: DC | PRN
  Administered 2019-12-03: 100 mg via INTRAVENOUS

## 2019-12-03 MED ORDER — EPHEDRINE SULFATE 50 MG/ML IJ SOLN
INTRAMUSCULAR | Status: DC | PRN
  Administered 2019-12-03: 7.5 mg via INTRAVENOUS

## 2019-12-03 MED ORDER — PHENYLEPHRINE HCL (PRESSORS) 10 MG/ML IV SOLN
INTRAVENOUS | Status: DC | PRN
  Administered 2019-12-03: 200 ug via INTRAVENOUS
  Administered 2019-12-03: 100 ug via INTRAVENOUS
  Administered 2019-12-03: 50 ug via INTRAVENOUS

## 2019-12-03 MED ORDER — SODIUM CHLORIDE 0.9 % IV SOLN: INTRAVENOUS | Status: DC

## 2019-12-03 MED ORDER — PROPOFOL 500 MG/50ML IV EMUL
INTRAVENOUS | Status: DC | PRN
  Administered 2019-12-03: 150 ug/kg/min via INTRAVENOUS

## 2019-12-03 MED ORDER — LIDOCAINE HCL (CARDIAC) PF 100 MG/5ML IV SOSY
PREFILLED_SYRINGE | INTRAVENOUS | Status: DC | PRN
  Administered 2019-12-03: 100 mg via INTRAVENOUS

## 2019-12-03 NOTE — H&P (Signed)
Outpatient short stay form Pre-procedure 12/03/2019 9:08 AM Joseph Schmitt K. Joseph Schmitt, M.D.  Primary Physician: Joseph Schmitt, M.D.  Reason for visit:  Personal hx of adenomatous colon polyps x 2 - 09/15/2010 colonoscopy.  History of present illness:                            Patient presents for colonoscopy for a personal hx of colon polyps. The patient denies abdominal pain, abnormal weight loss or rectal bleeding.  Patient is only one week s/p left heart catheterization with stent placement - Dr. Clayborn Schmitt. He continues, of course, on Plavix daily.     Current Facility-Administered Medications:  .  0.9 %  sodium chloride infusion, , Intravenous, Continuous, Joseph Schmitt, Joseph Pike, MD, Last Rate: 20 mL/hr at 12/03/19 0900, New Bag at 12/03/19 0900  Medications Prior to Admission  Medication Sig Dispense Refill Last Dose  . aspirin 81 MG chewable tablet Chew 1 tablet (81 mg total) by mouth daily. 120 tablet 0 12/02/2019 at Unknown time  . atorvastatin (LIPITOR) 40 MG tablet Take 1 tablet (40 mg total) by mouth daily at 6 PM. 30 tablet 0 12/02/2019 at Unknown time  . clopidogrel (PLAVIX) 75 MG tablet Take 1 tablet (75 mg total) by mouth daily. 30 tablet 0 12/03/2019 at Unknown time  . isosorbide mononitrate (IMDUR) 30 MG 24 hr tablet Take 1 tablet (30 mg total) by mouth daily. 30 tablet 0 12/03/2019 at Unknown time  . lisinopril (PRINIVIL,ZESTRIL) 40 MG tablet Take 1 tablet (40 mg total) by mouth daily. 30 tablet 0 12/03/2019 at Unknown time  . metoprolol tartrate (LOPRESSOR) 50 MG tablet Take 1 tablet (50 mg total) by mouth 2 (two) times daily. 60 tablet 0 12/03/2019 at Unknown time  . nitroGLYCERIN (NITROSTAT) 0.4 MG SL tablet Place 1 tablet (0.4 mg total) under the tongue every 5 (five) minutes as needed for chest pain. 30 tablet 0      No Known Allergies   Past Medical History:  Diagnosis Date  . Chlamydia   . Coronary artery disease   . Heart attack ([place])   . Heart murmur   . Hypercholesteremia    . Hypertension     Review of systems:  Otherwise negative.    Physical Exam  Gen: Alert, oriented. Appears stated age.  HEENT: [place]/AT. PERRLA. Lungs: CTA, no wheezes. CV: RR nl S1, S2. Abd: soft, benign, no masses. BS+ Ext: No edema. Pulses 2+    Planned procedures: Proceed with colonoscopy. The patient understands the nature of the planned procedure, indications, risks, alternatives and potential complications including but not limited to bleeding, infection, perforation, damage to internal organs and possible oversedation/side effects from anesthesia. The patient agrees and gives consent to proceed.  Please refer to procedure notes for findings, recommendations and patient disposition/instructions.     Joseph Schmitt K. Joseph Schmitt, M.D. Gastroenterology 12/03/2019  9:08 AM

## 2019-12-03 NOTE — Interval H&P Note (Signed)
History and Physical Interval Note:  12/03/2019 9:11 AM  Joseph Schmitt  has presented today for surgery, with the diagnosis of P HX TA POLYPS.  The various methods of treatment have been discussed with the patient and family. After consideration of risks, benefits and other options for treatment, the patient has consented to  Procedure(s): COLONOSCOPY WITH PROPOFOL (N/A) as a surgical intervention.  The patient's history has been reviewed, patient examined, no change in status, stable for surgery.  I have reviewed the patient's chart and labs.  Questions were answered to the patient's satisfaction.     [place], [place]

## 2019-12-03 NOTE — Anesthesia Postprocedure Evaluation (Signed)
Anesthesia Post Note  Patient: Joseph Schmitt  Procedure(s) Performed: COLONOSCOPY WITH PROPOFOL (N/A )  Patient location during evaluation: Endoscopy Anesthesia Type: General Level of consciousness: awake and alert Pain management: pain level controlled Vital Signs Assessment: post-procedure vital signs reviewed and stable Respiratory status: spontaneous breathing and respiratory function stable Cardiovascular status: stable Anesthetic complications: no     Last Vitals:  Vitals:   12/03/19 0940 12/03/19 0950  BP: (!) 74/49 111/70  Pulse: (!) 59 60  Resp: (!) 23 (!) 28  Temp: (!) 36.2 C   SpO2: 90% 97%    Last Pain:  Vitals:   12/03/19 0845  TempSrc: Temporal  PainSc: 0-No pain                 Kamayah Pillay K

## 2019-12-03 NOTE — Anesthesia Preprocedure Evaluation (Signed)
Anesthesia Evaluation  Patient identified by MRN, date of birth, ID band Patient awake    Reviewed: Allergy & Precautions, NPO status , Patient's Chart, lab work & pertinent test results  History of Anesthesia Complications Negative for: history of anesthetic complications  Airway Mallampati: III       Dental   Pulmonary neg sleep apnea, neg COPD, Not current smoker,           Cardiovascular hypertension, Pt. on medications + Past MI, + Cardiac Stents (stent placed 1 week ago. Has continued Plavix) and + CABG  (-) CHF (-) dysrhythmias (-) Valvular Problems/Murmurs     Neuro/Psych neg Seizures    GI/Hepatic Neg liver ROS, neg GERD  ,  Endo/Other  neg diabetes  Renal/GU negative Renal ROS     Musculoskeletal   Abdominal   Peds  Hematology   Anesthesia Other Findings   Reproductive/Obstetrics                             Anesthesia Physical Anesthesia Plan  ASA: III  Anesthesia Plan: General   Post-op Pain Management:    Induction: Intravenous  PONV Risk Score and Plan: 2 and Propofol infusion and TIVA  Airway Management Planned: Nasal Cannula  Additional Equipment:   Intra-op Plan:   Post-operative Plan:   Informed Consent: I have reviewed the patients History and Physical, chart, labs and discussed the procedure including the risks, benefits and alternatives for the proposed anesthesia with the patient or authorized representative who has indicated his/her understanding and acceptance.       Plan Discussed with:   Anesthesia Plan Comments:         Anesthesia Quick Evaluation

## 2019-12-03 NOTE — Transfer of Care (Signed)
Immediate Anesthesia Transfer of Care Note  Patient: Joseph Schmitt  Procedure(s) Performed: COLONOSCOPY WITH PROPOFOL (N/A )  Patient Location: Endoscopy Unit  Anesthesia Type:General  Level of Consciousness: drowsy  Airway & Oxygen Therapy: Patient Spontanous Breathing  Post-op Assessment: Report given to RN and Post -op Vital signs reviewed and stable  Post vital signs: Reviewed and stable, Hypotension treated with ephedrine and phenylephrine boluses  Last Vitals:  Vitals Value Taken Time  BP 101/72 12/03/19 0945  Temp 36.2 C 12/03/19 0940  Pulse 59 12/03/19 0946  Resp 23 12/03/19 0946  SpO2 100 % 12/03/19 0946  Vitals shown include unvalidated device data.  Last Pain:  Vitals:   12/03/19 0845  TempSrc: Temporal  PainSc: 0-No pain         Complications: No apparent anesthesia complications

## 2019-12-03 NOTE — Op Note (Signed)
[facility] Gastroenterology Patient Name: Dart Raulerson Procedure Date: 12/03/2019 9:11 AM MRN: [id number] Account #: [id number] Date of Birth: Jun 26, 1956 Admit Type: Outpatient Age: 64 Room: [facility] ENDO ROOM 3 Gender: Male Note Status: Finalized Procedure:             Colonoscopy Indications:           Surveillance: Personal history of adenomatous polyps                         on last colonoscopy > 5 years ago Providers:             Lorie Apley [name] MD, MD Referring MD:          No Local Md, MD (Referring MD) Medicines:             Propofol per Anesthesia Complications:         No immediate complications. Procedure:             Pre-Anesthesia Assessment:                        - The risks and benefits of the procedure and the                         sedation options and risks were discussed with the                         patient. All questions were answered and informed                         consent was obtained.                        - Patient identification and proposed procedure were                         verified prior to the procedure by the nurse. The                         procedure was verified in the procedure room.                        - ASA Grade Assessment: III - A patient with severe                         systemic disease.                        - After reviewing the risks and benefits, the patient                         was deemed in satisfactory condition to undergo the                         procedure.                        After obtaining informed consent, the colonoscope was                         passed under direct  vision. Throughout the procedure,                         the patient's blood pressure, pulse, and oxygen                         saturations were monitored continuously. The                         Colonoscope was introduced through the anus and                         advanced to the the cecum, identified by  appendiceal                         orifice and ileocecal valve. The colonoscopy was                         performed without difficulty. The patient tolerated                         the procedure well. The quality of the bowel                         preparation was good. The ileocecal valve, appendiceal                         orifice, and rectum were photographed. Findings:      The perianal and digital rectal examinations were normal. Pertinent       negatives include normal sphincter tone and no palpable rectal lesions.      A few small-mouthed diverticula were found in the ascending colon.      Non-bleeding internal hemorrhoids were found during retroflexion. The       hemorrhoids were Grade I (internal hemorrhoids that do not prolapse).      The exam was otherwise without abnormality. Impression:            - Diverticulosis in the ascending colon.                        - Non-bleeding internal hemorrhoids.                        - The examination was otherwise normal.                        - No specimens collected. Recommendation:        - Patient has a contact number available for                         emergencies. The signs and symptoms of potential                         delayed complications were discussed with the patient.                         Return to normal activities tomorrow. Written  discharge instructions were provided to the patient.                        - Resume previous diet.                        - Continue present medications.                        - Repeat colonoscopy in 5 years for surveillance.                        - Return to GI office PRN.                        - The findings and recommendations were discussed with                         the patient. Procedure Code(s):     --- Professional ---                        [id number], Colorectal cancer screening; colonoscopy on                         individual at high  risk Diagnosis Code(s):     --- Professional ---                        K57.30, Diverticulosis of large intestine without                         perforation or abscess without bleeding                        K64.0, First degree hemorrhoids                        Z86.010, Personal history of colonic polyps CPT copyright 2019 American [employer]. All rights reserved. The codes documented in this report are preliminary and upon coder review may  be revised to meet current compliance requirements. Efrain Sella MD, MD 12/03/2019 9:35:35 AM This report has been signed electronically. Number of Addenda: 0 Note Initiated On: 12/03/2019 9:11 AM Scope Withdrawal Time: 0 hours 6 minutes 41 seconds  Total Procedure Duration: 0 hours 10 minutes 43 seconds  Estimated Blood Loss:  Estimated blood loss: none.      [facility]

## 2019-12-04 ENCOUNTER — Encounter: Payer: Self-pay | Admitting: *Deleted

## 2020-05-14 DIAGNOSIS — Z952 Presence of prosthetic heart valve: Secondary | ICD-10-CM

## 2020-05-14 HISTORY — PX: TRANSCATHETER AORTIC VALVE REPLACEMENT, TRANSAORTIC: SHX6402

## 2020-05-14 HISTORY — DX: Presence of prosthetic heart valve: Z95.2

## 2020-05-26 DIAGNOSIS — Z950 Presence of cardiac pacemaker: Secondary | ICD-10-CM

## 2020-05-26 HISTORY — DX: Presence of cardiac pacemaker: Z95.0

## 2020-05-26 HISTORY — PX: INSERT / REPLACE / REMOVE PACEMAKER: SUR710

## 2020-06-30 DIAGNOSIS — I723 Aneurysm of iliac artery: Secondary | ICD-10-CM

## 2020-06-30 HISTORY — DX: Aneurysm of iliac artery: I72.3

## 2021-02-05 ENCOUNTER — Emergency Department
Admission: EM | Admit: 2021-02-05 | Discharge: 2021-02-05 | Disposition: A | Discharge status: Home / Self Care | Payer: 59 | Attending: Emergency Medicine | Admitting: Emergency Medicine

## 2021-02-05 ENCOUNTER — Emergency Department: Payer: 59

## 2021-02-05 ENCOUNTER — Other Ambulatory Visit: Payer: Self-pay

## 2021-02-05 ENCOUNTER — Encounter: Payer: Self-pay | Admitting: *Deleted

## 2021-02-05 ENCOUNTER — Telehealth: Payer: Self-pay | Admitting: Urology

## 2021-02-05 DIAGNOSIS — N200 Calculus of kidney: Secondary | ICD-10-CM

## 2021-02-05 DIAGNOSIS — I10 Essential (primary) hypertension: Secondary | ICD-10-CM | POA: Diagnosis not present

## 2021-02-05 DIAGNOSIS — D649 Anemia, unspecified: Secondary | ICD-10-CM | POA: Insufficient documentation

## 2021-02-05 DIAGNOSIS — Z7982 Long term (current) use of aspirin: Secondary | ICD-10-CM | POA: Insufficient documentation

## 2021-02-05 DIAGNOSIS — Z951 Presence of aortocoronary bypass graft: Secondary | ICD-10-CM | POA: Diagnosis not present

## 2021-02-05 DIAGNOSIS — N132 Hydronephrosis with renal and ureteral calculous obstruction: Secondary | ICD-10-CM | POA: Insufficient documentation

## 2021-02-05 DIAGNOSIS — R319 Hematuria, unspecified: Secondary | ICD-10-CM | POA: Diagnosis present

## 2021-02-05 DIAGNOSIS — Z79899 Other long term (current) drug therapy: Secondary | ICD-10-CM | POA: Insufficient documentation

## 2021-02-05 DIAGNOSIS — I2510 Atherosclerotic heart disease of native coronary artery without angina pectoris: Secondary | ICD-10-CM | POA: Insufficient documentation

## 2021-02-05 DIAGNOSIS — R310 Gross hematuria: Secondary | ICD-10-CM

## 2021-02-05 LAB — URINALYSIS, COMPLETE (UACMP) WITH MICROSCOPIC
Bacteria, UA: NONE SEEN
RBC / HPF: >50 RBC/hpf — ABNORMAL HIGH (ref 0–5)
Specific Gravity, Urine: 1.02 (ref 1.005–1.030)
Squamous Epithelial / HPF: NONE SEEN (ref 0–5)
WBC, UA: NONE SEEN WBC/hpf (ref 0–5)

## 2021-02-05 LAB — CBC WITH DIFFERENTIAL/PLATELET
Abs Immature Granulocytes: 0.02 10*3/uL (ref 0.00–0.07)
Basophils Absolute: 0.1 10*3/uL (ref 0.0–0.1)
Basophils Relative: 1 %
Eosinophils Absolute: 0.2 10*3/uL (ref 0.0–0.5)
Eosinophils Relative: 3 %
HCT: 39.1 % (ref 39.0–52.0)
Hemoglobin: 12.9 g/dL — ABNORMAL LOW (ref 13.0–17.0)
Immature Granulocytes: 0 %
Lymphocytes Relative: 20 %
Lymphs Abs: 1.4 10*3/uL (ref 0.7–4.0)
MCH: 27.6 pg (ref 26.0–34.0)
MCHC: 33 g/dL (ref 30.0–36.0)
MCV: 83.7 fL (ref 80.0–100.0)
Monocytes Absolute: 0.6 10*3/uL (ref 0.1–1.0)
Monocytes Relative: 9 %
Neutro Abs: 4.7 10*3/uL (ref 1.7–7.7)
Neutrophils Relative %: 67 %
Platelets: 191 10*3/uL (ref 150–400)
RBC: 4.67 MIL/uL (ref 4.22–5.81)
RDW: 14.6 % (ref 11.5–15.5)
WBC: 6.9 10*3/uL (ref 4.0–10.5)
nRBC: 0 % (ref 0.0–0.2)

## 2021-02-05 LAB — COMPREHENSIVE METABOLIC PANEL
ALT: 23 U/L (ref 0–44)
AST: 25 U/L (ref 15–41)
Albumin: 3.8 g/dL (ref 3.5–5.0)
Alkaline Phosphatase: 66 U/L (ref 38–126)
Anion gap: 6 (ref 5–15)
BUN: 25 mg/dL — ABNORMAL HIGH (ref 8–23)
CO2: 24 mmol/L (ref 22–32)
Calcium: 9.1 mg/dL (ref 8.9–10.3)
Chloride: 108 mmol/L (ref 98–111)
Creatinine, Ser: 1.04 mg/dL (ref 0.61–1.24)
GFR, Estimated: >60 mL/min (ref >60)
Glucose, Bld: 112 mg/dL — ABNORMAL HIGH (ref 70–99)
Potassium: 3.8 mmol/L (ref 3.5–5.1)
Sodium: 138 mmol/L (ref 135–145)
Total Bilirubin: 0.9 mg/dL (ref 0.3–1.2)
Total Protein: 7.6 g/dL (ref 6.5–8.1)

## 2021-02-05 LAB — PROTIME-INR
INR: 1.1 (ref 0.8–1.2)
Prothrombin Time: 14 seconds (ref 11.4–15.2)

## 2021-02-05 NOTE — ED Provider Notes (Signed)
[facility] Emergency Department Provider Note  ____________________________________________   Event Date/Time   First MD Initiated Contact with Patient 02/05/21 0405     (approximate)  I have reviewed the triage vital signs and the nursing notes.   HISTORY  Chief Complaint Hematuria    HPI Joseph Schmitt is a 65 y.o. male  with h/o CAD, HTN, HLD, here with hematuria.  Pt reports that this evening, he went to use the restroom and noticed that his urine was bloody. He denies any associated pain with urination or frequency. No flank pain. Denies h/o hematuria or stones. No known h/o BPH. He does take anticoagulation, however. No recent med changes. No recent trauma. No penile or scrotal pain. Denies seeing any clots.       Past Medical History:  Diagnosis Date   Chlamydia    Coronary artery disease    Heart attack ([address])    Heart murmur    Hypercholesteremia    Hypertension     Patient Active Problem List   Diagnosis Date Noted   Syncope 03/17/2016   Elevated troponin 03/15/2016    Past Surgical History:  Procedure Laterality Date   APPENDECTOMY     CARDIAC CATHETERIZATION N/A 03/16/2016   Procedure: Coronary/ grafts  Angiography;  Surgeon: [name], MD;  Location: [facility];  Service: Cardiovascular;  Laterality: N/A;   COLONOSCOPY WITH PROPOFOL N/A 12/03/2019   Procedure: COLONOSCOPY WITH PROPOFOL;  Surgeon: Toledo, Benay Pike, MD;  Location: [facility] ENDOSCOPY;  Service: Gastroenterology;  Laterality: N/A;   CORONARY ARTERY BYPASS GRAFT     CORONARY STENT PLACEMENT  2021    Prior to Admission medications   Medication Sig Start Date End Date Taking? Authorizing Provider  aspirin 81 MG chewable tablet Chew 1 tablet (81 mg total) by mouth daily. 03/17/16   Bettey Costa, MD  atorvastatin (LIPITOR) 40 MG tablet Take 1 tablet (40 mg total) by mouth daily at 6 PM. 03/17/16   Bettey Costa, MD  clopidogrel (PLAVIX) 75 MG tablet Take  1 tablet (75 mg total) by mouth daily. 03/17/16   Bettey Costa, MD  isosorbide mononitrate (IMDUR) 30 MG 24 hr tablet Take 1 tablet (30 mg total) by mouth daily. 03/17/16   Bettey Costa, MD  lisinopril (PRINIVIL,ZESTRIL) 40 MG tablet Take 1 tablet (40 mg total) by mouth daily. 03/17/16   Bettey Costa, MD  metoprolol tartrate (LOPRESSOR) 50 MG tablet Take 1 tablet (50 mg total) by mouth 2 (two) times daily. 03/17/16   Bettey Costa, MD  nitroGLYCERIN (NITROSTAT) 0.4 MG SL tablet Place 1 tablet (0.4 mg total) under the tongue every 5 (five) minutes as needed for chest pain. 03/17/16   Bettey Costa, MD    Allergies Patient has no known allergies.  Family History  Problem Relation Age of Onset   Hypertension Other     Social History Social History   Tobacco Use   Smoking status: Never   Smokeless tobacco: Never  Substance Use Topics   Alcohol use: No   Drug use: Never    Review of Systems  Review of Systems  Constitutional:  Negative for chills and fever.  HENT:  Negative for sore throat.   Respiratory:  Negative for shortness of breath.   Cardiovascular:  Negative for chest pain.  Gastrointestinal:  Negative for abdominal pain.  Genitourinary:  Positive for hematuria. Negative for flank pain.  Musculoskeletal:  Negative for neck pain.  Skin:  Negative for rash and wound.  Allergic/Immunologic: Negative for immunocompromised state.  Neurological:  Negative for weakness and numbness.  Hematological:  Does not bruise/bleed easily.    ____________________________________________  PHYSICAL EXAM:      VITAL SIGNS: ED Triage Vitals  Enc Vitals Group     BP 02/05/21 0410 (!) 221/100     Pulse Rate 02/05/21 0410 88     Resp 02/05/21 0410 18     Temp 02/05/21 0410 98.1 F (36.7 C)     Temp src --      SpO2 02/05/21 0410 97 %     Weight --      Height --      Head Circumference --      Peak Flow --      Pain Score 02/05/21 0409 0     Pain Loc --      Pain Edu? --      Excl. in [place]?  --      Physical Exam Vitals and nursing note reviewed.  Constitutional:      General: He is not in acute distress.    Appearance: He is well-developed.  HENT:     Head: Normocephalic and atraumatic.  Eyes:     Conjunctiva/sclera: Conjunctivae normal.  Cardiovascular:     Rate and Rhythm: Normal rate and regular rhythm.     Heart sounds: Normal heart sounds.  Pulmonary:     Effort: Pulmonary effort is normal. No respiratory distress.     Breath sounds: No wheezing.  Abdominal:     General: There is no distension.  Musculoskeletal:     Cervical back: Neck supple.  Skin:    General: Skin is warm.     Capillary Refill: Capillary refill takes less than 2 seconds.     Findings: No rash.  Neurological:     Mental Status: He is alert and oriented to person, place, and time.     Motor: No abnormal muscle tone.      ____________________________________________   LABS (all labs ordered are listed, but only abnormal results are displayed)  Labs Reviewed  URINALYSIS, COMPLETE (UACMP) WITH MICROSCOPIC - Abnormal; Notable for the following components:      Result Value   Color, Urine RED (*)    APPearance TURBID (*)    Glucose, UA   (*)    Value: TEST NOT REPORTED DUE TO COLOR INTERFERENCE OF URINE PIGMENT   Hgb urine dipstick   (*)    Value: TEST NOT REPORTED DUE TO COLOR INTERFERENCE OF URINE PIGMENT   Bilirubin Urine   (*)    Value: TEST NOT REPORTED DUE TO COLOR INTERFERENCE OF URINE PIGMENT   Ketones, ur   (*)    Value: TEST NOT REPORTED DUE TO COLOR INTERFERENCE OF URINE PIGMENT   Protein, ur   (*)    Value: TEST NOT REPORTED DUE TO COLOR INTERFERENCE OF URINE PIGMENT   Nitrite   (*)    Value: TEST NOT REPORTED DUE TO COLOR INTERFERENCE OF URINE PIGMENT   Leukocytes,Ua   (*)    Value: TEST NOT REPORTED DUE TO COLOR INTERFERENCE OF URINE PIGMENT   RBC / HPF >50 (*)    All other components within normal limits  CBC WITH DIFFERENTIAL/PLATELET - Abnormal; Notable for  the following components:   Hemoglobin 12.9 (*)    All other components within normal limits  COMPREHENSIVE METABOLIC PANEL - Abnormal; Notable for the following components:   Glucose, Bld 112 (*)    BUN 25 (*)  All other components within normal limits  PROTIME-INR    ____________________________________________  EKG:  ________________________________________  RADIOLOGY All imaging, including plain films, CT scans, and ultrasounds, independently reviewed by me, and interpretations confirmed via formal radiology reads.  ED MD interpretation:   CT Stone: 2 mm calculus in left ureter, just before UVJ with mild hydro, large bladder calculus, multiple nonobstructive renal calculi; extensive atherosclerosis s/sp graft placement, ? Aneurysm along right pelvic sidewall   Official radiology report(s): CT Renal Stone Study  Result Date: 02/05/2021 CLINICAL DATA:  65 year old male with history of flank pain. Hematuria. Suspected kidney stone. EXAM: CT ABDOMEN AND PELVIS WITHOUT CONTRAST TECHNIQUE: Multidetector CT imaging of the abdomen and pelvis was performed following the standard protocol without IV contrast. COMPARISON:  No priors. FINDINGS: Lower chest: Atherosclerotic calcifications in the descending thoracic aorta as well as the left circumflex coronary artery. Cardiomegaly. Status post TAVR. Pacemaker lead terminating in the right ventricular apex. Hepatobiliary: No definite suspicious cystic or solid hepatic lesions are confidently identified on today's noncontrast CT examination. Unenhanced appearance of the gallbladder is normal. Pancreas: No definite pancreatic mass or peripancreatic fluid collections or inflammatory changes are noted on today's noncontrast CT examination. Spleen: Unremarkable. Adrenals/Urinary Tract: Numerous nonobstructive calculi are noted within the collecting systems of both kidneys measuring up to 9 mm in the upper pole collecting system of the left kidney. In  addition, in the distal third of the left ureter shortly before the left ureterovesicular junction (axial image 72 of series 2) there is a 2 mm calculus which is associated with mild proximal left hydroureteronephrosis and left-sided periureteric soft tissue stranding. There is also a larger bladder calculus measuring 1.2 cm in diameter lying dependently (axial image 72 of series 2). Low-attenuation lesions in the kidneys bilaterally measuring up to 2 cm in the lower pole of the right kidney, incompletely characterized on today's non-contrast CT examination, but statistically likely to represent cysts. Right adrenal gland is normal in appearance. 1.6 cm low-intermediate attenuation (11 HU) lesion in the left adrenal gland is indeterminate. Urinary bladder is otherwise unremarkable in appearance. Stomach/Bowel: Unenhanced appearance of the stomach is normal. There is no pathologic dilatation of small bowel or colon. The appendix is not confidently identified and may be surgically absent. Regardless, there are no inflammatory changes noted adjacent to the cecum to suggest the presence of an acute appendicitis at this time. Vascular/Lymphatic: Aortic atherosclerosis. Stent graft in the abdominal aorta. Stent grafts also noted in the right common iliac artery extending into the right external iliac artery, as well as the left common iliac artery extending into both the left external and internal iliac arteries. Aneurysmal dilatation of the common iliac arteries bilaterally measuring up to 3.2 cm on the right and 4.3 cm on the left. Embolization coils are also noted in the region of the right hemipelvis, likely in the distribution of the right internal iliac artery or 1 of its branches. Associated with this there is extensive beam hardening artifact, but there appears to be a large lesion along the right pelvic sidewall measuring at least 4.1 x 4.8 cm (axial image 63 of series 2), which may represent a large aneurysm.  Reproductive: Prostate gland and seminal vesicles are unremarkable in appearance. Other: No significant volume of ascites.  No pneumoperitoneum. Musculoskeletal: There are no aggressive appearing lytic or blastic lesions noted in the visualized portions of the skeleton. IMPRESSION: 1. 2 mm calculus in the distal third of the left ureter shortly before the left ureterovesicular  junction with mild proximal left hydroureteronephrosis and left-sided periureteric soft tissue stranding indicating mild obstruction. There is also a large bladder calculus measuring 1.2 cm in diameter and multiple nonobstructive calculi in the collecting systems of both kidneys measuring up to 9 mm. 2. Extensive atherosclerosis with postprocedural changes of prior stent graft placement in the abdominal aorta and iliac vasculature bilaterally, as detailed above. There also embolization coils along the right pelvic sidewall with a poorly visualized structure along the right pelvic sidewall which is suspicious for an aneurysm. The chronicity of this finding is uncertain as there are no prior studies available for comparison. Follow-up nonemergent CTA of the abdomen and pelvis is recommended in the near future to better evaluate this finding. 3. Cardiomegaly. 4. 1.6 cm low-intermediate attenuation lesion in the left adrenal gland is indeterminate, but statistically likely to represent a lipid poor adenoma. Electronically Signed   By: Vinnie Langton M.D.   On: 02/05/2021 06:47    ____________________________________________  PROCEDURES   Procedure(s) performed (including Critical Care):  Procedures  ____________________________________________  INITIAL IMPRESSION / MDM / [place] / ED COURSE  As part of my medical decision making, I reviewed the following data within the [place] notes reviewed and incorporated, Old chart reviewed, Notes from prior ED visits, and [place] Controlled Substance  Database       *Herby Amick was evaluated in Emergency Department on 02/05/2021 for the symptoms described in the history of present illness. He was evaluated in the context of the global COVID-19 pandemic, which necessitated consideration that the patient might be at risk for infection with the SARS-CoV-2 virus that causes COVID-19. Institutional protocols and algorithms that pertain to the evaluation of patients at risk for COVID-19 are in a state of rapid change based on information released by regulatory bodies including the CDC and federal and state organizations. These policies and algorithms were followed during the patient's care in the ED.  Some ED evaluations and interventions may be delayed as a result of limited staffing during the pandemic.*     Medical Decision Making:  65 yo M here with asymptomatic, gross hematuria. No fever, chills. Pt is otherwise asymptomatic. Labs as above. CBC without leukocytosis. Mild anemia noted. CMP with normal renal function, Cr 1.04. UA with gross hematuria but no apparent bacteriuria or pyuria. CT renal stone study obtained, shows signs of 2 mm left ureteral stone, likely causing his hematuria given mild hydro, as well as bladder and b/l renal stones. Incidental note made of AAA s/p repair with possible pelvic aneurysm - this has been noted on his recent CTs at [facility], and he will notify his physician to compare. Discussed CT findings with Dr. Erlene Quan of Urology. Given that he is urinating w/o difficulty with no signs of significant clot formation or retention, feel it's reasonable to d/c with hydration, close urology f/u for repeat hgb check, examination. No signs of infection. Pt o/w HDS. He was hypertensive here but improved w/ reassurance and repositioning of cuff. No CP, SOB, or signs of HTN emergency.  ____________________________________________  FINAL CLINICAL IMPRESSION(S) / ED DIAGNOSES  Final diagnoses:  Gross hematuria  Nephrolithiasis      MEDICATIONS GIVEN DURING THIS VISIT:  Medications - No data to display   ED Discharge Orders     None        Note:  This document was prepared using Dragon voice recognition software and may include unintentional dictation errors.   Duffy Bruce, MD 02/05/21  0951  

## 2021-02-05 NOTE — ED Triage Notes (Signed)
Pt with "light blood" in urine this morning. Denies pain. Pt COVID+  last Wednesday.

## 2021-02-05 NOTE — Telephone Encounter (Signed)
Called patient advised per Dr. Erlene Quan, scheduled follow up

## 2021-02-05 NOTE — ED Notes (Signed)
IV removed.

## 2021-02-05 NOTE — Telephone Encounter (Signed)
Contacted by emergency room.  Painless gross hematuria likely secondary to small ureteral calculus as well as large bladder stone in the setting of antiplatelet therapy.  He is voiding well and labs are stable.  As such, follow-up was deemed appropriate.   Please have him follow-up with me in clinic on Tuesday.  Hollice Espy, MD

## 2021-02-05 NOTE — Discharge Instructions (Addendum)
The blood in your urine is likely coming from kidney stones.  Call Dr. Cherrie Gauze office to set up follow-up in the next 24 hours  Drink plenty of fluids  Avoid heavy lifting or strenuous exercise  Your CT also showed a chronic aneurysm related to your aortic stent. This has been seen on prior imaging but you should call your Vascular Surgeon to let them know you had imaging done in the ER today and that it was recommended that you touch base with them to review.  Avoid advil/ibuprofen/naproxen/alleve

## 2021-02-05 NOTE — ED Notes (Signed)
ED Provider at bedside. 

## 2021-02-10 ENCOUNTER — Other Ambulatory Visit: Payer: Self-pay

## 2021-02-10 ENCOUNTER — Encounter: Payer: Self-pay | Admitting: Urology

## 2021-02-10 ENCOUNTER — Ambulatory Visit (INDEPENDENT_AMBULATORY_CARE_PROVIDER_SITE_OTHER): Payer: 59 | Admitting: Urology

## 2021-02-10 VITALS — BP 192/90 | HR 65 | Ht 71.0 in | Wt 245.0 lb

## 2021-02-10 DIAGNOSIS — E278 Other specified disorders of adrenal gland: Secondary | ICD-10-CM | POA: Diagnosis not present

## 2021-02-10 DIAGNOSIS — N400 Enlarged prostate without lower urinary tract symptoms: Secondary | ICD-10-CM | POA: Diagnosis not present

## 2021-02-10 DIAGNOSIS — N201 Calculus of ureter: Secondary | ICD-10-CM

## 2021-02-10 DIAGNOSIS — R310 Gross hematuria: Secondary | ICD-10-CM

## 2021-02-10 DIAGNOSIS — N210 Calculus in bladder: Secondary | ICD-10-CM | POA: Diagnosis not present

## 2021-02-10 LAB — MICROSCOPIC EXAMINATION

## 2021-02-10 LAB — BLADDER SCAN AMB NON-IMAGING: Scan Result: 0

## 2021-02-10 LAB — URINALYSIS, COMPLETE
Bilirubin, UA: NEGATIVE
Glucose, UA: NEGATIVE
Ketones, UA: NEGATIVE
Leukocytes,UA: NEGATIVE
Nitrite, UA: NEGATIVE
Specific Gravity, UA: 1.025 (ref 1.005–1.030)
Urobilinogen, Ur: 0.2 mg/dL (ref 0.2–1.0)
pH, UA: 5.5 (ref 5.0–7.5)

## 2021-02-10 NOTE — Patient Instructions (Signed)
Cystoscopy Cystoscopy is a procedure that is used to help diagnose and sometimes treat conditions that affect the lower urinary tract. The lower urinary tract includes the bladder and the urethra. The urethra is the tube that drains urine from the bladder. Cystoscopy is done using a thin, tube-shaped instrument with a light and camera at the end (cystoscope). The cystoscope may be hard or flexible, depending on the goal of the procedure. The cystoscope is inserted through the urethra, into the bladder. Cystoscopy may be recommended if you have: Urinary tract infections that keep coming back. Blood in the urine (hematuria). An inability to control when you urinate (urinary incontinence) or an overactive bladder. Unusual cells found in a urine sample. A blockage in the urethra, such as a urinary stone. Painful urination. An abnormality in the bladder found during an intravenous pyelogram (IVP) or CT scan. Cystoscopy may also be done to remove a sample of tissue to be examined under a microscope (biopsy). What are the risks? Generally, this is a safe procedure. However, problems may occur, including: Infection. Bleeding.  What happens during the procedure?  You will be given one or more of the following: A medicine to numb the area (local anesthetic). The area around the opening of your urethra will be cleaned. The cystoscope will be passed through your urethra into your bladder. Germ-free (sterile) fluid will flow through the cystoscope to fill your bladder. The fluid will stretch your bladder so that your health care provider can clearly examine your bladder walls. Your doctor will look at the urethra and bladder. The cystoscope will be removed The procedure may vary among health care providers  What can I expect after the procedure? After the procedure, it is common to have: Some soreness or pain in your abdomen and urethra. Urinary symptoms. These include: Mild pain or burning when you  urinate. Pain should stop within a few minutes after you urinate. This may last for up to 1 week. A small amount of blood in your urine for several days. Feeling like you need to urinate but producing only a small amount of urine. Follow these instructions at home: General instructions Return to your normal activities as told by your health care provider.  Do not drive for 24 hours if you were given a sedative during your procedure. Watch for any blood in your urine. If the amount of blood in your urine increases, call your health care provider. If a tissue sample was removed for testing (biopsy) during your procedure, it is up to you to get your test results. Ask your health care provider, or the department that is doing the test, when your results will be ready. Drink enough fluid to keep your urine pale yellow. Keep all follow-up visits as told by your health care provider. This is important. Contact a health care provider if you: Have pain that gets worse or does not get better with medicine, especially pain when you urinate. Have trouble urinating. Have more blood in your urine. Get help right away if you: Have blood clots in your urine. Have abdominal pain. Have a fever or chills. Are unable to urinate. Summary Cystoscopy is a procedure that is used to help diagnose and sometimes treat conditions that affect the lower urinary tract. Cystoscopy is done using a thin, tube-shaped instrument with a light and camera at the end. After the procedure, it is common to have some soreness or pain in your abdomen and urethra. Watch for any blood in your urine.   If the amount of blood in your urine increases, call your health care provider. If you were prescribed an antibiotic medicine, take it as told by your health care provider. Do not stop taking the antibiotic even if you start to feel better. This information is not intended to replace advice given to you by your health care provider. Make  sure you discuss any questions you have with your health care provider. Document Revised: 07/04/2018 Document Reviewed: 07/04/2018 Elsevier Patient Education  2020 [employer]  

## 2021-02-10 NOTE — Progress Notes (Signed)
02/10/2021 4:47 PM   Joseph Schmitt Jan 10, 1956 [id number]  Referring provider: [place],  [place] [id number]  Chief Complaint  Patient presents with   Benign Prostatic Hypertrophy    HPI: 65 year old male who presents today for further evaluation of hematuria, kidney and bladder stones.  He is accompanied by his wife.  He was seen and evaluated in the emergency room on 02/05/2021 with gross hematuria which was painless.  He underwent noncontrast CT scan due to concern for possible stone.  He was noted to have multiple bilateral stones, nonobstructing in the kidneys measuring up to 9 mm as well as a 2 mm left UVJ stone with mild hydroureteronephrosis and a nonobstructing 1.2 cm bladder stone.  Prostamegaly is appreciated.  Incidental indeterminate left adrenal lesion measuring 1.6 cm.    He denies any significant urinary symptoms.  His hematuria is completely resolved.  He feels like he is emptying his bladder well.  No flank pain. He has not seen the stone pass.    No recent PSAs or rectal exam screening.  He is on aspirin and Plavix.  Never smoker.    PMH: Past Medical History:  Diagnosis Date   Chlamydia    Coronary artery disease    Heart attack ([place])    Heart murmur    Hypercholesteremia    Hypertension     Surgical History: Past Surgical History:  Procedure Laterality Date   APPENDECTOMY     CARDIAC CATHETERIZATION N/A 03/16/2016   Procedure: Coronary/ grafts  Angiography;  Surgeon: [name], MD;  Location: [facility];  Service: Cardiovascular;  Laterality: N/A;   COLONOSCOPY WITH PROPOFOL N/A 12/03/2019   Procedure: COLONOSCOPY WITH PROPOFOL;  Surgeon: Toledo, Benay Pike, MD;  Location: [facility];  Service: Gastroenterology;  Laterality: N/A;   CORONARY ARTERY BYPASS GRAFT     CORONARY STENT PLACEMENT  2021    Home Medications:  Allergies as of 02/10/2021   No Known Allergies      Medication  List        Accurate as of February 10, 2021 11:59 PM. If you have any questions, ask your nurse or doctor.          aspirin 81 MG chewable tablet Chew 1 tablet (81 mg total) by mouth daily.   atorvastatin 40 MG tablet Commonly known as: LIPITOR Take 1 tablet (40 mg total) by mouth daily at 6 PM.   clopidogrel 75 MG tablet Commonly known as: Plavix Take 1 tablet (75 mg total) by mouth daily.   isosorbide mononitrate 30 MG 24 hr tablet Commonly known as: IMDUR Take 1 tablet (30 mg total) by mouth daily.   lisinopril 40 MG tablet Commonly known as: ZESTRIL Take 1 tablet (40 mg total) by mouth daily.   metoprolol tartrate 50 MG tablet Commonly known as: LOPRESSOR Take 1 tablet (50 mg total) by mouth 2 (two) times daily.   nitroGLYCERIN 0.4 MG SL tablet Commonly known as: NITROSTAT Place 1 tablet (0.4 mg total) under the tongue every 5 (five) minutes as needed for chest pain.        Allergies: No Known Allergies  Family History: Family History  Problem Relation Age of Onset   Hypertension Other     Social History:  reports that he has never smoked. He has never used smokeless tobacco. He reports that he does not drink alcohol and does not use drugs.   Physical Exam: BP (!) 192/90  Pulse 65   Ht 5\' 11"  (1.803 m)   Wt 245 lb (111.1 kg)   BMI 34.17 kg/m   Constitutional:  Alert and oriented, No acute distress. HEENT: [place] AT, moist mucus membranes.  Trachea midline, no masses. Cardiovascular: No clubbing, cyanosis, or edema. Respiratory: Normal respiratory effort, no increased work of breathing. GI: Abdomen is soft, nontender, nondistended, no abdominal masses Rectal: Normal sphincter tone.  Normal prostate, nontender, no nodules. Skin: No rashes, bruises or suspicious lesions. Neurologic: Grossly intact, no focal deficits, moving all 4 extremities. Psychiatric: Normal mood and affect.  Laboratory Data: Lab Results  Component Value Date   WBC 6.9 02/05/2021    HGB 12.9 (L) 02/05/2021   HCT 39.1 02/05/2021   MCV 83.7 02/05/2021   PLT 191 02/05/2021    Lab Results  Component Value Date   CREATININE 1.04 02/05/2021     Urinalysis Results for orders placed or performed in visit on 02/10/21  Microscopic Examination   Urine  Result Value Ref Range   WBC, UA 0-5 0 - 5 /hpf   RBC 3-10 (A) 0 - 2 /hpf   Epithelial Cells (non renal) 0-10 0 - 10 /hpf   Bacteria, UA Few (A) None seen/Few  Urinalysis, Complete  Result Value Ref Range   Specific Gravity, UA 1.025 1.005 - 1.030   pH, UA 5.5 5.0 - 7.5   Color, UA Yellow Yellow   Appearance Ur Clear Clear   Leukocytes,UA Negative Negative   Protein,UA Trace (A) Negative/Trace   Glucose, UA Negative Negative   Ketones, UA Negative Negative   RBC, UA 2+ (A) Negative   Bilirubin, UA Negative Negative   Urobilinogen, Ur 0.2 0.2 - 1.0 mg/dL   Nitrite, UA Negative Negative   Microscopic Examination See below:   PSA  Result Value Ref Range   Prostate Specific Ag, Serum 1.0 0.0 - 4.0 ng/mL  Bladder Scan (Post Void Residual) in office  Result Value Ref Range   Scan Result 0       Pertinent Imaging  CT Renal Stone Study  Narrative CLINICAL DATA:  65 year old male with history of flank pain. Hematuria. Suspected kidney stone.  EXAM: CT ABDOMEN AND PELVIS WITHOUT CONTRAST  TECHNIQUE: Multidetector CT imaging of the abdomen and pelvis was performed following the standard protocol without IV contrast.  COMPARISON:  No priors.  FINDINGS: Lower chest: Atherosclerotic calcifications in the descending thoracic aorta as well as the left circumflex coronary artery. Cardiomegaly. Status post TAVR. Pacemaker lead terminating in the right ventricular apex.  Hepatobiliary: No definite suspicious cystic or solid hepatic lesions are confidently identified on today's noncontrast CT examination. Unenhanced appearance of the gallbladder is normal.  Pancreas: No definite pancreatic mass or  peripancreatic fluid collections or inflammatory changes are noted on today's noncontrast CT examination.  Spleen: Unremarkable.  Adrenals/Urinary Tract: Numerous nonobstructive calculi are noted within the collecting systems of both kidneys measuring up to 9 mm in the upper pole collecting system of the left kidney. In addition, in the distal third of the left ureter shortly before the left ureterovesicular junction (axial image 72 of series 2) there is a 2 mm calculus which is associated with mild proximal left hydroureteronephrosis and left-sided periureteric soft tissue stranding. There is also a larger bladder calculus measuring 1.2 cm in diameter lying dependently (axial image 72 of series 2). Low-attenuation lesions in the kidneys bilaterally measuring up to 2 cm in the lower pole of the right kidney, incompletely characterized on today's non-contrast CT  examination, but statistically likely to represent cysts. Right adrenal gland is normal in appearance. 1.6 cm low-intermediate attenuation (11 HU) lesion in the left adrenal gland is indeterminate. Urinary bladder is otherwise unremarkable in appearance.  Stomach/Bowel: Unenhanced appearance of the stomach is normal. There is no pathologic dilatation of small bowel or colon. The appendix is not confidently identified and may be surgically absent. Regardless, there are no inflammatory changes noted adjacent to the cecum to suggest the presence of an acute appendicitis at this time.  Vascular/Lymphatic: Aortic atherosclerosis. Stent graft in the abdominal aorta. Stent grafts also noted in the right common iliac artery extending into the right external iliac artery, as well as the left common iliac artery extending into both the left external and internal iliac arteries. Aneurysmal dilatation of the common iliac arteries bilaterally measuring up to 3.2 cm on the right and 4.3 cm on the left. Embolization coils are also noted in  the region of the right hemipelvis, likely in the distribution of the right internal iliac artery or 1 of its branches. Associated with this there is extensive beam hardening artifact, but there appears to be a large lesion along the right pelvic sidewall measuring at least 4.1 x 4.8 cm (axial image 63 of series 2), which may represent a large aneurysm.  Reproductive: Prostate gland and seminal vesicles are unremarkable in appearance.  Other: No significant volume of ascites.  No pneumoperitoneum.  Musculoskeletal: There are no aggressive appearing lytic or blastic lesions noted in the visualized portions of the skeleton.  IMPRESSION: 1. 2 mm calculus in the distal third of the left ureter shortly before the left ureterovesicular junction with mild proximal left hydroureteronephrosis and left-sided periureteric soft tissue stranding indicating mild obstruction. There is also a large bladder calculus measuring 1.2 cm in diameter and multiple nonobstructive calculi in the collecting systems of both kidneys measuring up to 9 mm. 2. Extensive atherosclerosis with postprocedural changes of prior stent graft placement in the abdominal aorta and iliac vasculature bilaterally, as detailed above. There also embolization coils along the right pelvic sidewall with a poorly visualized structure along the right pelvic sidewall which is suspicious for an aneurysm. The chronicity of this finding is uncertain as there are no prior studies available for comparison. Follow-up nonemergent CTA of the abdomen and pelvis is recommended in the near future to better evaluate this finding. 3. Cardiomegaly. 4. 1.6 cm low-intermediate attenuation lesion in the left adrenal gland is indeterminate, but statistically likely to represent a lipid poor adenoma.   Electronically Signed By: Vinnie Langton M.D. On: 02/05/2021 06:47  CT scan was personally reviewed.  Agree with radiologic  interpretation.  Assessment & Plan:    1. Benign prostatic hyperplasia without lower urinary tract symptoms PSA screening was updated today  Despite having significant size prostate, bladder stone he is emptying well and is fairly minimal symptoms.  - Bladder Scan (Post Void Residual) in office - Urinalysis, Complete - PSA; Future - PSA  2. Bladder stone Incidental bladder stone, may be secondary to asymptomatic bladder outlet obstruction or possibly even a previous kidney stone retained in the bladder  Will likely need treatment but will rule out underlying pathology with below work-up  3. Gross hematuria I recommended additional upper tract imaging in the form of CT urogram and cystoscopy for further evaluation.  - CT HEMATURIA WORKUP; Future  4. Adrenal mass 1 cm to 4 cm in diameter [facility]) Will characterize with CT urogram  Will assess the need for  metabolic evaluation based on CT characteristics  5. Left ureteral stone Fairly asymptomatic ureteral stone, possibly the cause of his gross hematuria in the setting of antiplatelet therapy aspirin and Plavix  Unclear whether or not this is retained or passed, will evaluate with CT urogram   F/u cysto/ CT Urogram  Hollice Espy, MD  [facility] [address], [place], [place] [address] [phone]  I spent 60 total minutes on the day of the encounter including pre-visit review of the medical record, face-to-face time with the patient, and post visit ordering of labs/imaging/tests.

## 2021-02-11 ENCOUNTER — Telehealth: Payer: Self-pay | Admitting: *Deleted

## 2021-02-11 LAB — PSA: Prostate Specific Ag, Serum: 1 ng/mL (ref 0.0–4.0)

## 2021-02-11 NOTE — Telephone Encounter (Addendum)
Patient informed, voiced understanding.    ----- Message from Hollice Espy, MD sent at 02/11/2021  8:22 AM EDT ----- PSA is low, 1.0.  Good news.  Hollice Espy, MD

## 2021-02-12 ENCOUNTER — Encounter: Payer: Self-pay | Admitting: Urology

## 2021-03-02 ENCOUNTER — Ambulatory Visit: Admission: RE | Admit: 2021-03-02 | Payer: 59 | Source: Ambulatory Visit

## 2021-03-03 ENCOUNTER — Other Ambulatory Visit: Payer: Self-pay | Admitting: Urology

## 2021-04-09 ENCOUNTER — Ambulatory Visit: Payer: 59 | Attending: Urology

## 2021-04-13 NOTE — Progress Notes (Incomplete)
   04/14/2021  CC: No chief complaint on file.   HPI: Joseph Schmitt is a 65 y.o. male with a personal history of hematuria and kidney/bladder stones, who presents today for a cystoscopy.   CT on 02/05/2021 showed concern for possible stone. He was noted to have multiple bilateral stones, nonobstructing in the kidneys measuring up to 9 mm as well as a 2 mm left UVJ stone with mild hydroureteronephrosis and a nonobstructing 1.2 cm bladder stone.  Prostamegaly is appreciated.   Incidental indeterminate left adrenal lesion measuring 1.6 cm.   There were no vitals filed for this visit. NED. A&Ox3.   No respiratory distress   Abd soft, NT, ND Normal phallus with bilateral descended testicles  Cystoscopy Procedure Note  Patient identification was confirmed, informed consent was obtained, and patient was prepped using Betadine solution.  Lidocaine jelly was administered per urethral meatus.     Pre-Procedure: - Inspection reveals a normal caliber ureteral meatus.  Procedure: The flexible cystoscope was introduced without difficulty - No urethral strictures/lesions are present. - {Blank multiple:19197::"Enlarged","Surgically absent","Normal"} prostate *** - {Blank multiple:19197::"Normal","Elevated","Tight"} bladder neck - Bilateral ureteral orifices identified - Bladder mucosa  reveals no ulcers, tumors, or lesions - No bladder stones - No trabeculation  Retroflexion shows ***   Post-Procedure: - Patient tolerated the procedure well  Assessment/ Plan:   No follow-ups on file.  I,Kailey Littlejohn,acting as a Education administrator for Hollice Espy, MD.,have documented all relevant documentation on the behalf of Hollice Espy, MD,as directed by  Hollice Espy, MD while in the presence of Hollice Espy, MD.

## 2021-04-14 ENCOUNTER — Other Ambulatory Visit: Payer: 59 | Admitting: Urology

## 2021-04-14 ENCOUNTER — Encounter: Payer: Self-pay | Admitting: Urology

## 2021-04-14 ENCOUNTER — Ambulatory Visit (INDEPENDENT_AMBULATORY_CARE_PROVIDER_SITE_OTHER): Payer: 59 | Admitting: Urology

## 2021-04-14 ENCOUNTER — Other Ambulatory Visit: Payer: Self-pay

## 2021-04-14 VITALS — BP 222/98 | HR 71 | Ht 71.0 in | Wt 245.0 lb

## 2021-04-14 DIAGNOSIS — N400 Enlarged prostate without lower urinary tract symptoms: Secondary | ICD-10-CM | POA: Diagnosis not present

## 2021-04-14 NOTE — Progress Notes (Signed)
   04/14/2021   CC:  Chief Complaint  Patient presents with   Cysto    HPI: Joseph Schmitt is a 65 y.o. male  with a personal history of hematuria, kidney/bladder stones, and BPH with LUTS , who presents today for a cystoscopy.   He was seen in the ED on 02/05/2021 with gross hematuria. CT scan was noted to have multiple bilateral stones, nonobstructing in the kidneys measuring up to 9 mm as well as a 2 mm left UVJ stone with mild hydroureteronephrosis and nonobstructing 1.2 cm bladder stone.   Incidental indeterminate left adrenal lesion measuring 1.6 cm.  He reports that he is doing well today. He has rescheduled his CT for October.     Vitals:   04/14/21 1500  BP: (!) 222/98  Pulse: 71  NED. A&Ox3.   No respiratory distress   Abd soft, NT, ND Normal phallus with bilateral descended testicles  Cystoscopy Procedure Note  Patient identification was confirmed, informed consent was obtained, and patient was prepped using Betadine solution.  Lidocaine jelly was administered per urethral meatus.     Pre-Procedure: - Inspection reveals a normal caliber ureteral meatus.  Procedure: The flexible cystoscope was introduced without difficulty - No urethral strictures/lesions are present. - Enlarged prostate with trilobar coaptation, greater than 5 cm prostatic length - Elevated bladder neck without a discrete median lobe - Obscuring of trigone secondary to enlarged prostate - Bladder mucosa  reveals no ulcers, tumors, or lesions - No bladder stones identified today - No trabeculation  Retroflexion shows elevated bladder neck without a discrete medium lobe.    Post-Procedure: - Patient tolerated the procedure well  Assessment/ Plan:  Nocturia  - Primarily discussed behavorial modifications  - Counseled him on refraining from liquid intake 4 hours before   Bladder stone  - No bladder stone evident on cystoscopy today   3.  Kidney stones/adrenal mass Follow-up CT  pending, rescheduled today   F/u after CT urogram  I,Kailey Littlejohn,acting as a scribe for Hollice Espy, MD.,have documented all relevant documentation on the behalf of Hollice Espy, MD,as directed by  Hollice Espy, MD while in the presence of Hollice Espy, MD.

## 2021-04-15 LAB — MICROSCOPIC EXAMINATION: RBC, Urine: >30 /hpf — AB (ref 0–2)

## 2021-04-15 LAB — URINALYSIS, COMPLETE
Bilirubin, UA: NEGATIVE
Glucose, UA: NEGATIVE
Leukocytes,UA: NEGATIVE
Nitrite, UA: NEGATIVE
Specific Gravity, UA: >1.03 — ABNORMAL HIGH (ref 1.005–1.030)
Urobilinogen, Ur: 0.2 mg/dL (ref 0.2–1.0)
pH, UA: 5.5 (ref 5.0–7.5)

## 2021-05-01 ENCOUNTER — Ambulatory Visit
Admission: RE | Admit: 2021-05-01 | Discharge: 2021-05-01 | Disposition: A | Discharge status: Home / Self Care | Payer: 59 | Source: Ambulatory Visit | Attending: Urology | Admitting: Urology

## 2021-05-01 ENCOUNTER — Other Ambulatory Visit: Payer: Self-pay

## 2021-05-01 DIAGNOSIS — R310 Gross hematuria: Secondary | ICD-10-CM | POA: Insufficient documentation

## 2021-05-01 DIAGNOSIS — N400 Enlarged prostate without lower urinary tract symptoms: Secondary | ICD-10-CM | POA: Diagnosis present

## 2021-05-01 DIAGNOSIS — N281 Cyst of kidney, acquired: Secondary | ICD-10-CM

## 2021-05-01 HISTORY — DX: Cyst of kidney, acquired: N28.1

## 2021-05-01 LAB — POCT I-STAT CREATININE: Creatinine, Ser: 1.2 mg/dL (ref 0.61–1.24)

## 2021-05-01 MED ORDER — IOHEXOL 350 MG/ML SOLN
100.0000 mL | Freq: Once | INTRAVENOUS | Status: AC | PRN
  Administered 2021-05-01: 100 mL via INTRAVENOUS

## 2021-05-05 NOTE — Progress Notes (Incomplete)
05/05/21 11:37 AM   Joseph Schmitt [birth year] [id number]  Referring provider:  [facility] [address] [place],  [place] [address] No chief complaint on file.    HPI: Joseph Schmitt is a 66 y.o.male with a personal history of hematuria, kidney/bladder stones, and BPH with LUTS, who presents today for CT scan results.   CT scan on 02/05/2021 revealed multiple bilateral stones, nonobstructing in the kidneys measuring up to 9 mm as well as a 2 mm UVJ stone with mild hydroureteronephrosis and nonobstructing 1.2 cm bladder stone. Incidental indeterminate left adrenal lesion measuring 1.6 cm.  CT hematuria workup on 05/01/2021 revealed 8 x 5 x 10 mm stone at or just distal to the left UPJ without hydronephrosis or substantial secondary changes. Bilateral nonobstructing renal stones and cysts. 14 mm left adenoma and s/p aorto bi-iliac endograft placement. 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        PMH: Past Medical History:  Diagnosis Date   Chlamydia    Coronary artery disease    Heart attack ([place])    Heart murmur    Hypercholesteremia    Hypertension     Surgical History: Past Surgical History:  Procedure Laterality Date   APPENDECTOMY     CARDIAC CATHETERIZATION N/A 03/16/2016   Procedure: Coronary/ grafts  Angiography;  Surgeon: [name], MD;  Location: [facility];  Service: Cardiovascular;  Laterality: N/A;   COLONOSCOPY WITH PROPOFOL N/A 12/03/2019   Procedure: COLONOSCOPY WITH PROPOFOL;  Surgeon: Toledo, Benay Pike, MD;  Location: [facility];  Service: Gastroenterology;  Laterality: N/A;   CORONARY ARTERY BYPASS GRAFT     CORONARY STENT PLACEMENT  2021    Home Medications:  Allergies as of 05/06/2021   No Known Allergies      Medication List        Accurate as of May 05, 2021 11:37 AM. If you have any questions, ask your nurse or doctor.           aspirin 81 MG chewable tablet Chew 1 tablet (81 mg total) by mouth daily.   atorvastatin 40 MG tablet Commonly known as: LIPITOR Take 1 tablet (40 mg total) by mouth daily at 6 PM.   clopidogrel 75 MG tablet Commonly known as: Plavix Take 1 tablet (75 mg total) by mouth daily.   Eliquis 5 MG Tabs tablet Generic drug: apixaban Take 5 mg by mouth 2 (two) times daily.   isosorbide mononitrate 30 MG 24 hr tablet Commonly known as: IMDUR Take 1 tablet (30 mg total) by mouth daily.   lisinopril 40 MG tablet Commonly known as: ZESTRIL Take 1 tablet (40 mg total) by mouth daily.   metoprolol tartrate 50 MG tablet Commonly known as: LOPRESSOR Take 1 tablet (50 mg total) by mouth 2 (two) times daily.   nitroGLYCERIN 0.4 MG SL tablet Commonly known as: NITROSTAT Place 1 tablet (0.4 mg total) under the tongue every 5 (five) minutes as needed for chest pain.        Allergies: No Known Allergies  Family History: Family History  Problem Relation Age of Onset   Hypertension Other     Social History:  reports that he has never smoked. He has never used smokeless tobacco. He reports that he does not drink alcohol and does not use drugs.   Physical Exam: There were no vitals taken for this visit.  Constitutional:  Alert and oriented, No acute distress. HEENT:  [place] AT, moist mucus membranes.  Trachea midline, no masses. Cardiovascular: No clubbing, cyanosis, or edema. Respiratory: Normal respiratory effort, no increased work of breathing. Skin: No rashes, bruises or suspicious lesions. Neurologic: Grossly intact, no focal deficits, moving all 4 extremities. Psychiatric: Normal mood and affect.  Laboratory Data:  Lab Results  Component Value Date   CREATININE 1.20 05/01/2021    No results found for: PSA  No results found for: TESTOSTERONE  Lab Results  Component Value Date   HGBA1C 5.7 03/15/2016    Urinalysis   Pertinent Imaging: CLINICAL DATA:  Intermittent  gross hematuria.   EXAM: CT ABDOMEN AND PELVIS WITHOUT AND WITH CONTRAST   TECHNIQUE: Multidetector CT imaging of the abdomen and pelvis was performed following the standard protocol before and following the bolus administration of intravenous contrast.   CONTRAST:  139mL OMNIPAQUE IOHEXOL 350 MG/ML SOLN   COMPARISON:  CT stone study 02/05/2021.   FINDINGS: Lower chest: Unremarkable.   Hepatobiliary: No suspicious focal abnormality within the liver parenchyma. There is no evidence for gallstones, gallbladder wall thickening, or pericholecystic fluid. No intrahepatic or extrahepatic biliary dilation.   Pancreas: No focal mass lesion. No dilatation of the main duct. No intraparenchymal cyst. No peripancreatic edema.   Spleen: No splenomegaly. No focal mass lesion.   Adrenals/Urinary Tract: Right adrenal gland unremarkable. 14 mm left adrenal nodule has average attenuation of 5 Hounsfield units on precontrast imaging compatible with adenoma.   Precontrast imaging shows a 4 mm nonobstructing stone in the upper pole right kidney with a 8 x 7 mm nonobstructing stone in the interpolar region. 3 additional stones are seen in the interpolar and lower pole region of the right kidney with the most dominant of these 3 in the lower interpolar region measuring 9 x 10 x 7 mm. No right ureteral stone.   4 stones are seen in the left kidney with vascular calcification noted in the left renal hilum. None of the stones or obstructing and the dominant stones are seen in the lower pole measuring in the 4-6 mm size range. Despite the lack of hydronephrosis or secondary changes in the left kidney, a 8 x 5 x 10 mm stone is identified at or just distal to the left UPJ. No other left ureteral stone. No bladder stone.   Imaging after IV contrast administration shows no suspicious enhancing mass lesion in either kidney. 2.1 cm well-defined homogeneous low-density lesion in the upper pole right  kidney has attenuation higher than simple fluid but shows no enhancement after IV contrast administration consistent with Bosniak II cyst. Multiple benign cyst noted left kidney.   Delayed post-contrast imaging shows no wall thickening or soft tissue filling defect in either intrarenal collecting system or renal pelvis. 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 Both ureters are well opacified without evidence for wall thickening, soft tissue lesion or focal dilatation. Delayed imaging of the bladder shows no focal wall thickening or mass lesion.   Stomach/Bowel: Stomach is unremarkable. No gastric wall thickening. No evidence of outlet obstruction. Duodenum is normally positioned as is the ligament of Treitz. No small bowel wall thickening. No small bowel dilatation. The terminal ileum is normal. The appendix is not well visualized, but there is no edema or inflammation in the region of the cecum. No gross colonic mass. No colonic wall thickening.   Vascular/Lymphatic: Status post aorto bi-iliac endograft placement including  limbs to both the internal and external iliac arteries on the left. Embolization coils noted right pelvic sidewall. 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 Left common iliac artery native aneurysm sac measures 3.7 cm diameter. 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 There is no gastrohepatic or hepatoduodenal ligament lymphadenopathy. No retroperitoneal or mesenteric lymphadenopathy. No pelvic sidewall lymphadenopathy.   Reproductive: The prostate gland and seminal vesicles are unremarkable.   Other: No intraperitoneal free fluid.   Musculoskeletal: No worrisome lytic or sclerotic  osseous abnormality.   IMPRESSION: 1. 8 x 5 x 10 mm stone at or just distal to the left UPJ without hydronephrosis or substantial secondary changes in the left kidney. 2. Bilateral nonobstructing renal stones. 3. Bilateral renal cysts. 4. 14 mm left adrenal adenoma. 5. Status post aorto bi-iliac endograft placement. 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 Vascular surgery consultation may be warranted. 6.  Aortic Atherosclerois (ICD10-170.0)     Electronically Signed   By: Misty Stanley M.D.   On: 05/04/2021 14:50   Assessment & Plan:     No follow-ups on file.  I,Kailey Littlejohn,acting as a Education administrator for Hollice Espy, MD.,have documented all relevant documentation on the behalf of Hollice Espy, MD,as directed by  Hollice Espy, MD while in the presence of Hollice Espy, [facility] [address] [place], [place] [address] [phone]

## 2021-05-06 ENCOUNTER — Ambulatory Visit: Payer: 59 | Admitting: Urology

## 2021-05-08 ENCOUNTER — Encounter: Payer: Self-pay | Admitting: Urology

## 2021-05-18 NOTE — Progress Notes (Signed)
We have been unsuccessful in trying to reach the patient again today including attempt at all contact numbers.  On CT scan, he does have an obstructing ureteral calculus.  Will send another letter indicating the need for urologic follow-up.

## 2021-05-19 ENCOUNTER — Other Ambulatory Visit: Payer: Self-pay

## 2021-05-19 ENCOUNTER — Ambulatory Visit (INDEPENDENT_AMBULATORY_CARE_PROVIDER_SITE_OTHER): Payer: 59 | Admitting: Urology

## 2021-05-19 DIAGNOSIS — N400 Enlarged prostate without lower urinary tract symptoms: Secondary | ICD-10-CM

## 2021-05-19 NOTE — Progress Notes (Signed)
Tried to reach patient to log in for virtual visit appointment. I called pt x 3 with no response.   Kerman Passey, RMA

## 2021-05-22 ENCOUNTER — Encounter: Payer: Self-pay | Admitting: *Deleted

## 2021-05-22 NOTE — Progress Notes (Signed)
Sent patient letter to follow up

## 2021-06-04 ENCOUNTER — Other Ambulatory Visit: Payer: Self-pay

## 2021-06-04 ENCOUNTER — Telehealth (INDEPENDENT_AMBULATORY_CARE_PROVIDER_SITE_OTHER): Payer: 59 | Admitting: Urology

## 2021-06-04 DIAGNOSIS — N201 Calculus of ureter: Secondary | ICD-10-CM

## 2021-06-04 DIAGNOSIS — N400 Enlarged prostate without lower urinary tract symptoms: Secondary | ICD-10-CM

## 2021-06-04 NOTE — Progress Notes (Signed)
Attempted 3 times to reach patient at primary cell number no answer, voicemail left.  Called home number spoke with wife she states she will contact patient and have him call the office. Patient does not have his mychart currently active so a connect link was sent via text.  Patient called back and states he is unable to have his visit today due to being at work and would like to reschedule. Patient was scheduled for in office appointment tomorrow in [facility] location per patient request

## 2021-06-05 ENCOUNTER — Ambulatory Visit (INDEPENDENT_AMBULATORY_CARE_PROVIDER_SITE_OTHER): Payer: 59 | Admitting: Urology

## 2021-06-05 ENCOUNTER — Other Ambulatory Visit
Admission: RE | Admit: 2021-06-05 | Discharge: 2021-06-05 | Disposition: A | Discharge status: Home / Self Care | Payer: 59 | Source: Home / Self Care | Attending: Urology | Admitting: Urology

## 2021-06-05 ENCOUNTER — Other Ambulatory Visit: Payer: Self-pay | Admitting: Urology

## 2021-06-05 ENCOUNTER — Ambulatory Visit
Admission: RE | Admit: 2021-06-05 | Discharge: 2021-06-05 | Disposition: A | Discharge status: Home / Self Care | Payer: 59 | Attending: Urology | Admitting: Urology

## 2021-06-05 ENCOUNTER — Ambulatory Visit
Admission: RE | Admit: 2021-06-05 | Discharge: 2021-06-05 | Disposition: A | Discharge status: Home / Self Care | Payer: 59 | Source: Ambulatory Visit | Attending: Urology | Admitting: Urology

## 2021-06-05 ENCOUNTER — Encounter: Payer: Self-pay | Admitting: Urology

## 2021-06-05 VITALS — BP 220/100 | HR 80 | Ht 71.0 in | Wt 257.0 lb

## 2021-06-05 DIAGNOSIS — N200 Calculus of kidney: Secondary | ICD-10-CM

## 2021-06-05 DIAGNOSIS — N201 Calculus of ureter: Secondary | ICD-10-CM | POA: Diagnosis present

## 2021-06-05 DIAGNOSIS — R3129 Other microscopic hematuria: Secondary | ICD-10-CM

## 2021-06-05 LAB — URINALYSIS, COMPLETE (UACMP) WITH MICROSCOPIC
Glucose, UA: NEGATIVE mg/dL
Leukocytes,Ua: NEGATIVE
Nitrite: NEGATIVE
Protein, ur: 100 mg/dL — AB
RBC / HPF: >50 RBC/hpf (ref 0–5)
Specific Gravity, Urine: >1.03 — ABNORMAL HIGH (ref 1.005–1.030)
pH: 5 (ref 5.0–8.0)

## 2021-06-05 NOTE — Patient Instructions (Signed)
Ureteroscopy °Ureteroscopy is a procedure to check for and treat problems inside part of the urinary tract. In this procedure, a thin, flexible tube with a light at the end (ureteroscope) is used to look at the inside of the kidneys and the ureters. The ureters are the tubes that carry urine from the kidneys to the bladder. The ureteroscope is inserted into one or both of the ureters. °You may need this procedure if you have frequent urinary tract infections (UTIs), blood in your urine, or a stone in one of your ureters. A ureteroscopy can be done: °To find the cause of urine blockage in a ureter and to evaluate other abnormalities inside the ureters or kidneys. °To remove stones. °To remove or treat growths of tissue (polyps), abnormal tissue, and some types of tumors. °To remove a tissue sample and check it for disease under a microscope (biopsy). °Tell a health care provider about: °Any allergies you have. °All medicines you are taking, including vitamins, herbs, eye drops, creams, and over-the-counter medicines. °Any problems you or family members have had with anesthetic medicines. °Any blood disorders you have. °Any surgeries you have had. °Any medical conditions you have. °Whether you are pregnant or may be pregnant. °What are the risks? °Generally, this is a safe procedure. However, problems may occur, including: °Bleeding. °Infection. °Allergic reactions to medicines. °Scarring that narrows the ureter (stricture). °Creating a hole in the ureter (perforation). °What happens before the procedure? °Staying hydrated °Follow instructions from your health care provider about hydration, which may include: °Up to 2 hours before the procedure - you may continue to drink clear liquids, such as water, clear fruit juice, black coffee, and plain tea. ° °Eating and drinking restrictions °Follow instructions from your health care provider about eating and drinking, which may include: °8 hours before the procedure - stop  eating heavy meals or foods, such as meat, fried foods, or fatty foods. °6 hours before the procedure - stop eating light meals or foods, such as toast or cereal. °6 hours before the procedure - stop drinking milk or drinks that contain milk. °2 hours before the procedure - stop drinking clear liquids. °Medicines °Ask your health care provider about: °Changing or stopping your regular medicines. This is especially important if you are taking diabetes medicines or blood thinners. °Taking medicines such as aspirin and ibuprofen. These medicines can thin your blood. Do not take these medicines unless your health care provider tells you to take them. °Taking over-the-counter medicines, vitamins, herbs, and supplements. °General instructions °Do not use any products that contain nicotine or tobacco for at least 4 weeks before the procedure. These products include cigarettes, e-cigarettes, and chewing tobacco. If you need help quitting, ask your health care provider. °You may have a urine sample taken to check for infection. °Plan to have someone take you home from the hospital or clinic. °If you will be going home right after the procedure, plan to have someone with you for 24 hours. °Ask your health care provider what steps will be taken to help prevent infection. These may include: °Washing skin with a germ-killing soap. °Receiving antibiotic medicine. °What happens during the procedure? ° °An IV will be inserted into one of your veins. °You will be given one or more of the following: °A medicine to help you relax (sedative). °A medicine to make you fall asleep (general anesthetic). °A medicine that is injected into your spine to numb the area below and slightly above the injection site (spinal anesthetic). °The   part of your body that drains urine from your bladder (urethra) will be cleaned with a germ-killing solution. °The ureteroscope will be passed through your urethra into your bladder. °A salt-water solution will  be sent through the ureteroscope to fill your bladder. This will help the health care provider see the openings of your ureters more clearly. °The ureteroscope will be passed into your ureter. °If a growth is found, a biopsy may be done. °If a stone is found, it may be removed through the ureteroscope, or the stone may be broken up using a laser, shock waves, or electrical energy. °In some cases, if the ureter is too small, a tube may be inserted that keeps the ureter open (ureteral stent). The stent may be left in place for 1 or 2 weeks to keep the ureter open, and then the ureteroscopy procedure will be done. °The scope will be removed, and your bladder will be emptied. °The procedure may vary among health care providers and hospitals. °What can I expect after the procedure? °After your procedure, it is common to have: °Your blood pressure, heart rate, breathing rate, and blood oxygen level monitored until you leave the hospital or clinic. °A burning sensation when you urinate. You may be asked to urinate. °Blood in your urine. °Mild discomfort in your bladder area or kidney area when urinating. °A need to urinate more often or urgently. °Follow these instructions at home: °Medicines °Take over-the-counter and prescription medicines only as told by your health care provider. °If you were prescribed an antibiotic medicine, take it as told by your health care provider. Do not stop taking the antibiotic even if you start to feel better. °General instructions ° °If you were given a sedative during the procedure, it can affect you for several hours. Do not drive or operate machinery until your health care provider says that it is safe. °To relieve burning, take a warm bath or hold a warm washcloth over your groin. °Drink enough fluid to keep your urine pale yellow. °Drink two 8-ounce (237 mL) glasses of water every hour for the first 2 hours after you get home. °Continue to drink water often at home. °You can eat what  you normally do. °Keep all follow-up visits as told by your health care provider. This is important. °If you had a ureteral stent placed, ask your health care provider when you need to return to have it removed. °Contact a health care provider if you have: °Chills or a fever. °Burning pain for longer than 24 hours after the procedure. °Blood in your urine for longer than 24 hours after the procedure. °Get help right away if you have: °Large amounts of blood in your urine. °Blood clots in your urine. °Severe pain. °Chest pain or trouble breathing. °The feeling of a full bladder and you are unable to urinate. °These symptoms may represent a serious problem that is an emergency. Do not wait to see if the symptoms will go away. Get medical help right away. Call your local emergency services (911 in the [place] °Summary °Ureteroscopy is a procedure to check for and treat problems inside part of the urinary tract. °In this procedure, a thin, flexible tube with a light at the end (ureteroscope) is used to look at the inside of the kidneys and the ureters. °You may need this procedure if you have frequent urinary tract infections (UTIs), blood in your urine, or a stone in a ureter. °This information is not intended to replace advice given to   you by your health care provider. Make sure you discuss any questions you have with your health care provider. °Document Revised: 03/25/2021 Document Reviewed: 04/18/2019 °Elsevier Patient Education © 2022 [employer] ° °

## 2021-06-05 NOTE — Progress Notes (Unsigned)
Surgical Physician Order Form  * Scheduling expectation : Next Available  *Length of Case:   *Clearance needed: yes; Dr. Bobby Rumpf  *Anticoagulation Instructions:  Preference to hold Eliquis if possible, if deemed high risk, may continue  *Aspirin Instructions: Ok to continue Aspirin  *Post-op visit Date/Instructions:  1 month with RUS prior  *Diagnosis: Left Ureteral Stone, bilateral kidney stones  *Procedure: Ureteroscopy w/laser lithotripsy & stent placement/exchange (63875)  Plan for left ureteroscopy laser lithotripsy and stent, possible right ureteroscopy laser lithotripsy and stent  -Admit type: OUTpatient  -Anesthesia: General  -VTE Prophylaxis Standing Order SCD's       Other:   -Standing Lab Orders Per Anesthesia    Lab other: None  -Standing Test orders EKG/Chest x-ray per Anesthesia       Test other:   - Medications:     Ancef 2gm IV   Other Instructions:

## 2021-06-05 NOTE — Progress Notes (Signed)
06/05/2021 1:48 PM   Joseph Schmitt March 22, 1956 [id number]  Referring provider: [place],  [place] [address]  Chief Complaint  Patient presents with   Follow-up   Nephrolithiasis    HPI: 65 year old male accompanied by his wife today to discuss a CT urogram results.  He initially presented for hematuria work-up.  He is found to have multiple bilateral kidney stones including a bladder stone.  He underwent cystoscopy at which time the bladder stone was noted to have resolved.  He had persistent microscopic hematuria.  He underwent CT urogram on 05/04/2021 CT abdomen and pelvis revealed 8 x 5 x 10 mm stone at or just distal to the left UPJ without hydronephrosis or substantial secondary changes in the left kidney. Bilateral nonobstructing renal stones. Bilateral renal cysts.14 mm left adrenal adenoma.  He denies have any any flank pain whatsoever.  He has not been symptomatic from the stones.  He is never required stone intervention.  He is on Eliquis and followed by cardiologist.  He has been able to stop his Eliquis for various procedures in the past.  He has persistent microscopic blood in his urine today.   PMH: Past Medical History:  Diagnosis Date   Chlamydia    Coronary artery disease    Heart attack ([place])    Heart murmur    Hypercholesteremia    Hypertension     Surgical History: Past Surgical History:  Procedure Laterality Date   APPENDECTOMY     CARDIAC CATHETERIZATION N/A 03/16/2016   Procedure: Coronary/ grafts  Angiography;  Surgeon: [name], MD;  Location: [facility];  Service: Cardiovascular;  Laterality: N/A;   COLONOSCOPY WITH PROPOFOL N/A 12/03/2019   Procedure: COLONOSCOPY WITH PROPOFOL;  Surgeon: Toledo, Benay Pike, MD;  Location: [facility];  Service: Gastroenterology;  Laterality: N/A;   CORONARY ARTERY BYPASS GRAFT     CORONARY STENT PLACEMENT  2021    Home Medications:  Allergies  as of 06/05/2021   No Known Allergies      Medication List        Accurate as of June 05, 2021  1:48 PM. If you have any questions, ask your nurse or doctor.          aspirin 81 MG chewable tablet Chew 1 tablet (81 mg total) by mouth daily.   atorvastatin 40 MG tablet Commonly known as: LIPITOR Take 1 tablet (40 mg total) by mouth daily at 6 PM.   clopidogrel 75 MG tablet Commonly known as: Plavix Take 1 tablet (75 mg total) by mouth daily.   Eliquis 5 MG Tabs tablet Generic drug: apixaban Take 5 mg by mouth 2 (two) times daily.   isosorbide mononitrate 30 MG 24 hr tablet Commonly known as: IMDUR Take 1 tablet (30 mg total) by mouth daily.   lisinopril 40 MG tablet Commonly known as: ZESTRIL Take 1 tablet (40 mg total) by mouth daily.   metoprolol tartrate 50 MG tablet Commonly known as: LOPRESSOR Take 1 tablet (50 mg total) by mouth 2 (two) times daily.   nitroGLYCERIN 0.4 MG SL tablet Commonly known as: NITROSTAT Place 1 tablet (0.4 mg total) under the tongue every 5 (five) minutes as needed for chest pain.        Allergies: No Known Allergies  Family History: Family History  Problem Relation Age of Onset   Hypertension Other     Social History:  reports that he has never smoked. He has never  used smokeless tobacco. He reports that he does not drink alcohol and does not use drugs.   Physical Exam: BP (!) 220/100   Pulse 80   Ht 5\' 11"  (1.803 m)   Wt 257 lb (116.6 kg)   BMI 35.84 kg/m   Constitutional:  Alert and oriented, No acute distress.  Accompanied by his wife today. HEENT: [place] AT, moist mucus membranes.  Trachea midline, no masses. Cardiovascular: No clubbing, cyanosis, or edema. Skin: No rashes, bruises or suspicious lesions. Neurologic: Grossly intact, no focal deficits, moving all 4 extremities. Psychiatric: Normal mood and affect.  Laboratory Data: Lab Results  Component Value Date   WBC 6.9 02/05/2021   HGB 12.9 (L)  02/05/2021   HCT 39.1 02/05/2021   MCV 83.7 02/05/2021   PLT 191 02/05/2021    Lab Results  Component Value Date   CREATININE 1.20 05/01/2021    Lab Results  Component Value Date   HGBA1C 5.7 03/15/2016    Urinalysis Component     Latest Ref Rng & Units 06/05/2021  Color, Urine     YELLOW YELLOW  Appearance     CLEAR CLOUDY (A)  Specific Gravity, Urine     1.005 - 1.030 >1.030 (H)  pH     5.0 - 8.0 5.0  Glucose, UA     NEGATIVE mg/dL NEGATIVE  Hgb urine dipstick     NEGATIVE LARGE (A)  Bilirubin Urine     NEGATIVE SMALL (A)  Ketones, ur     NEGATIVE mg/dL TRACE (A)  Protein     NEGATIVE mg/dL 100 (A)  Nitrite     NEGATIVE NEGATIVE  Leukocytes,Ua     NEGATIVE NEGATIVE  Squamous Epithelial / LPF     0 - 5 0-5  WBC, UA     0 - 5 WBC/hpf 0-5  RBC / HPF     0 - 5 RBC/hpf >50  Bacteria, UA     NONE SEEN RARE (A)  Ca Oxalate Crys, UA      PRESENT    Pertinent Imaging:  CT HEMATURIA WORKUP  Narrative CLINICAL DATA:  Intermittent gross hematuria.  EXAM: CT ABDOMEN AND PELVIS WITHOUT AND WITH CONTRAST  TECHNIQUE: Multidetector CT imaging of the abdomen and pelvis was performed following the standard protocol before and following the bolus administration of intravenous contrast.  CONTRAST:  173mL OMNIPAQUE IOHEXOL 350 MG/ML SOLN  COMPARISON:  CT stone study 02/05/2021.  FINDINGS: Lower chest: Unremarkable.  Hepatobiliary: No suspicious focal abnormality within the liver parenchyma. There is no evidence for gallstones, gallbladder wall thickening, or pericholecystic fluid. No intrahepatic or extrahepatic biliary dilation.  Pancreas: No focal mass lesion. No dilatation of the main duct. No intraparenchymal cyst. No peripancreatic edema.  Spleen: No splenomegaly. No focal mass lesion.  Adrenals/Urinary Tract: Right adrenal gland unremarkable. 14 mm left adrenal nodule has average attenuation of 5 Hounsfield units on precontrast imaging  compatible with adenoma.  Precontrast imaging shows a 4 mm nonobstructing stone in the upper pole right kidney with a 8 x 7 mm nonobstructing stone in the interpolar region. 3 additional stones are seen in the interpolar and lower pole region of the right kidney with the most dominant of these 3 in the lower interpolar region measuring 9 x 10 x 7 mm. No right ureteral stone.  4 stones are seen in the left kidney with vascular calcification noted in the left renal hilum. None of the stones or obstructing and the dominant stones are seen in the  lower pole measuring in the 4-6 mm size range. Despite the lack of hydronephrosis or secondary changes in the left kidney, a 8 x 5 x 10 mm stone is identified at or just distal to the left UPJ. No other left ureteral stone. No bladder stone.  Imaging after IV contrast administration shows no suspicious enhancing mass lesion in either kidney. 2.1 cm well-defined homogeneous low-density lesion in the upper pole right kidney has attenuation higher than simple fluid but shows no enhancement after IV contrast administration consistent with Bosniak II cyst. Multiple benign cyst noted left kidney.  Delayed post-contrast imaging shows no wall thickening or soft tissue filling defect in either intrarenal collecting system or renal pelvis. 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 Both ureters are well opacified without evidence for wall thickening, soft tissue lesion or focal dilatation. Delayed imaging of the bladder shows no focal wall thickening or mass lesion.  Stomach/Bowel: Stomach is unremarkable. No gastric wall thickening. No evidence of outlet obstruction. Duodenum is normally positioned as is the ligament of Treitz. No small bowel wall thickening. No small bowel dilatation. The terminal ileum is normal. The  appendix is not well visualized, but there is no edema or inflammation in the region of the cecum. No gross colonic mass. No colonic wall thickening.  Vascular/Lymphatic: Status post aorto bi-iliac endograft placement including limbs to both the internal and external iliac arteries on the left. Embolization coils noted right pelvic sidewall. 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 Left common iliac artery native aneurysm sac measures 3.7 cm diameter. 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 There is no gastrohepatic or hepatoduodenal ligament lymphadenopathy. No retroperitoneal or mesenteric lymphadenopathy. No pelvic sidewall lymphadenopathy.  Reproductive: The prostate gland and seminal vesicles are unremarkable.  Other: No intraperitoneal free fluid.  Musculoskeletal: No worrisome lytic or sclerotic osseous abnormality.  IMPRESSION: 1. 8 x 5 x 10 mm stone at or just distal to the left UPJ without hydronephrosis or substantial secondary changes in the left kidney. 2. Bilateral nonobstructing renal stones. 3. Bilateral renal cysts. 4. 14 mm left adrenal adenoma. 5. Status post aorto bi-iliac endograft placement. 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 Vascular surgery consultation may be warranted. 6.  Aortic Atherosclerois (ICD10-170.0)   Electronically Signed By: Misty Stanley M.D. On: 05/04/2021 14:50  The above CT scan was personally reviewed today and with the patient in the room.  I showed him his images and compared it to his previous CT scan.  He has had interval progression of 10 mm stone into the left proximal ureter with hydronephrosis along with other bilateral nonobstructing stones of fairly significant cysts size.  KUB was also personally reviewed today.  The proximal ureteral  stone was not clearly seen but there is distortion from overlying graft.   Assessment & Plan:    1. Left ureteral stone Obstructing left proximal ureteral calculus, not easily seen on KUB today but still has persistent microscopic blood  We discussed various treatment options for urolithiasis including observation with or without medical expulsive therapy, shockwave lithotripsy (SWL), ureteroscopy and laser lithotripsy with stent placement, and percutaneous nephrolithotomy.   We discussed that management is based on stone size, location, density, patient co-morbidities, and patient preference.    Stones <  67mm in size have a >80% spontaneous passage rate. Data surrounding the use of tamsulosin for medical expulsive therapy is controversial, but meta analyses suggests it is most efficacious for distal stones between 5-2mm in size. Possible side effects include dizziness/lightheadedness, and retrograde ejaculation.   SWL has a lower stone free rate in a single procedure, but also a lower complication rate compared to ureteroscopy and avoids a stent and associated stent related symptoms. Possible complications include renal hematoma, steinstrasse, and need for additional treatment. We discussed the role of his increased skin to stone distance can lead to decreased efficacy with shockwave lithotripsy.   Ureteroscopy with laser lithotripsy and stent placement has a higher stone free rate than SWL in a single procedure, however increased complication rate including possible infection, ureteral injury, bleeding, and stent related morbidity. Common stent related symptoms include dysuria, urgency/frequency, and flank pain.   After an extensive discussion of the risks and benefits of the above treatment options, the patient would like to proceed with left ureteroscopy  He will need cardiac clearance.  Ideally, he can hold his Eliquis prior to the procedure.  - Urinalysis, Complete w Microscopic; Future -  Urine Culture; Future  2. Kidney stones Will treat left-sided nonobstructing stones, possibly proceed with right ureteroscopy depending on how the left goes  3. Microscopic hematuria Likely secondary to #1    Return for surgery will call to schedule.  Hollice Espy, MD  [facility] [address], [place], [place] [address] [phone]

## 2021-06-06 LAB — URINE CULTURE: Culture: <10000 — AB

## 2021-06-30 ENCOUNTER — Encounter: Payer: Self-pay | Admitting: Urology

## 2021-07-03 ENCOUNTER — Other Ambulatory Visit: Payer: Self-pay

## 2021-07-03 ENCOUNTER — Telehealth: Payer: Self-pay | Admitting: Urgent Care

## 2021-07-03 DIAGNOSIS — N200 Calculus of kidney: Secondary | ICD-10-CM

## 2021-07-03 DIAGNOSIS — N201 Calculus of ureter: Secondary | ICD-10-CM

## 2021-07-03 NOTE — Progress Notes (Signed)
  Perioperative Services Pre-Admission/Anesthesia Testing     Date: 07/03/21  Name: Joseph Schmitt MRN:   [id number]  Re: Request from surgery for clearance prior to scheduled procedure  Patient is scheduled to undergo a CYSTOSCOPY/URETEROSCOPY/HOLMIUM LASER/STENT PLACEMENT on 08/03/2021 with Dr. Hollice Espy, MD. Patient has not been scheduled for his PAT appointment at this point, thus has not undergone review by PAT RN and/or APP. Received communication from primary attending surgeon's office requesting that patient be submitted for clearance from cardiology.   PROVIDER SPECIALTY FAXED TO   [name], NP-C  Cardiology  [phone]   Plan:  Clearance documents generated and faxed to appropriate provider(s) as noted above. Note will be updated to reflect communication with provider's office as it relates to clearance being provided and/or the need for office visit prior to clearance for surgery being issued.   Honor Loh, MSN, APRN, FNP-C, CEN [facility]  Peri-operative Services Nurse Practitioner Phone: [phone] 07/03/21 3:58 PM  NOTE: This note has been prepared using Dragon dictation software. Despite my best ability to proofread, there is always the potential that unintentional transcriptional errors may still occur from this process.

## 2021-07-07 NOTE — Progress Notes (Signed)
[facility] Posting Form   Surgery Date/Time: Date: 08/03/2021  Surgeon: Dr. Hollice Espy, MD  Surgery Location: Day Surgery  Inpt ( No  )   Outpt (Yes)   Obs ( No  )   Diagnosis: N20.1 Left Ureteral Stone, N 20.0 Bilateral Kidney Stones  -CPT: 61518   Surgery: Left URS/LL/Stent Placement with possible Right URS/LL/Stent Placement  Stop Anticoagulations: Yes; Prefernce is to hold Eliquis but if deemed high risk, may continue  Cardiac/Medical/Pulmonary Clearance needed: yes  Clearance needed from Dr: Bobby Rumpf  Clearance request sent on: Date: 07/03/2021 but PAT provider Honor Loh, NP  *Orders entered into EPIC  Date: 07/07/21   *Case booked in EPIC  Date: 07/03/2021  *Notified pt of Surgery: Date: 07/03/2021  PRE-OP UA & CX: none  *Placed into Prior Authorization Work Que Date: 07/07/21   Assistant/laser/rep:No

## 2021-07-28 ENCOUNTER — Encounter
Admission: RE | Admit: 2021-07-28 | Discharge: 2021-07-28 | Disposition: A | Discharge status: Home / Self Care | Payer: 59 | Source: Ambulatory Visit | Attending: Urology | Admitting: Urology

## 2021-07-28 ENCOUNTER — Telehealth: Payer: Self-pay | Admitting: Urgent Care

## 2021-07-28 ENCOUNTER — Other Ambulatory Visit: Payer: Self-pay

## 2021-07-28 HISTORY — DX: Presence of cardiac pacemaker: Z95.0

## 2021-07-28 HISTORY — DX: Personal history of urinary calculi: Z87.442

## 2021-07-28 NOTE — Pre-Procedure Instructions (Signed)
Patient states he stopped eliquis and aspirin 3 days before previous procedures.  Will take last dose on Thurs. 1/5

## 2021-07-28 NOTE — Patient Instructions (Signed)
Your procedure is scheduled on: Report to . To find out your arrival time please call [phone] between 1PM - 3PM on   Remember: Instructions that are not followed completely may result in serious medical risk,  up to and including death, or upon the discretion of your surgeon and anesthesiologist your  surgery may need to be rescheduled.     _X__ 1. Do not eat food after midnight the night before your procedure.                 No chewing gum or hard candies. You may drink clear liquids up to 2 hours                 before you are scheduled to arrive for your surgery- DO not drink clear                 liquids within 2 hours of the start of your surgery.                 Clear Liquids include:  water, apple juice without pulp, clear Gatorade, G2 or                  Gatorade Zero (avoid Red/Purple/Blue), Black Coffee or Tea (Do not add                 anything to coffee or tea). __X__2.  On the morning of surgery brush your teeth with toothpaste and water, you                may rinse your mouth with mouthwash if you wish.  Do not swallow any  toothpaste of mouthwash.     _X__ 3.  No Alcohol for 24 hours before or after surgery.   ___ 4.  Do Not Smoke or use e-cigarettes For 24 Hours Prior to Your Surgery.                 Do not use any chewable tobacco products for at least 6 hours prior to                 Surgery.  ___  5.  Do not use any recreational drugs (marijuana, cocaine, heroin, ecstasy,  MDMA or other) For at least one week prior to your surgery.   Combination of these drugs with anesthesia may have life threatening results.  ____  6.  Bring all medications with you on the day of surgery if instructed.   __x__  7.  Notify your doctor if there is any change in your medical condition      (cold, fever, infections).     Do not wear jewelry, make-up, hairpins, clips or nail polish. Do not wear lotions, powders, or perfumes. You may wear deodorant. Do  not shave 48 hours prior to surgery. Men may shave face and neck. Do not bring valuables to the hospital.    [facility] is not responsible for any belongings or valuables.  Contacts, dentures or bridgework may not be worn into surgery. Leave your suitcase in the car. After surgery it may be brought to your room. For patients admitted to the hospital, discharge time is determined by your treatment team.   Patients discharged the day of surgery will not be allowed to drive home.   Make arrangements for someone to be with you for the first 24 hours of your Same Day Discharge.    Please read over the following fact sheets  that you were given:       __x__ Take these medicines the morning of surgery with A SIP OF WATER:    1. amLODipine (NORVASC) 10 MG tablet  2. metoprolol tartrate (LOPRESSOR) 25 MG tablet  3. rosuvastatin (CRESTOR) 40 MG tablet  4.  5.  6.  ____ Fleet Enema (as directed)   ____ Use CHG Soap (or wipes) as directed  ____ Use Benzoyl Peroxide Gel as instructed  ____ Use inhalers on the day of surgery  ____ Stop metformin 2 days prior to surgery    ____ Take 1/2 of usual insulin dose the night before surgery. No insulin the morning          of surgery.   ___x_ Stop Eliquis and aspirin on   last dose on Thurs. 1/5  ____ Stop Anti-inflammatories on    ___x_ Stop supplements until after surgery.  cyanocobalamin 1000 MCG tablet,cholecalciferol (VITAMIN D3) 25 MCG (1000 UNIT) tablet   ____ Bring C-Pap to the hospital.    If you have any questions regarding your pre-procedure instructions,  Please call Pre-admit Testing at [phone]

## 2021-07-28 NOTE — Progress Notes (Signed)
°  Perioperative Services Pre-Admission/Anesthesia Testing    Date: 07/28/21  Name: Joseph Schmitt MRN:   [id number]  Re: Clearance for urology procedure   Patient is scheduled to undergo cystoscopy/ureteroscopy/holmium laser/stent placement on 08/03/2021 with Dr. Hollice Espy, MD.  Per communication from Dr. Cherrie Gauze office, cardiac clearance has been requested by the surgeon.  I have made multiple attempts to reach out for clearance on this patient.  Fax communication sent on 07/03/2021, 07/07/2021, 07/10/2021, 07/14/2021, and 07/17/2021.  Additionally, I have placed several phone calls (x 3) to the office and left messages on machine regarding clearance and need for completion of pacemaker documentation, however I have not received return communication from the cardiology practice regarding this patient.    Additional phone call placed today (07/28/2021) asking for a return call to discuss clearance and completion of required pacemaker documentation prior to patient undergoing procedure.  Will forward a copy of this note to surgery scheduler at [place] to make them aware.  As previously mentioned, I have been unsuccessful in contacting patient's cardiologist to obtain needed clearances/documentation for him to proceed.  We will ask office to reach out to cardiology practice in attempts to obtain, with the caveat being that if unable obtain due to documentation, the patient procedure may need to be rescheduled.  Honor Loh, MSN, APRN, FNP-C, CEN [facility]  Peri-operative Services Nurse Practitioner Phone: [phone] 07/28/21 2:10 PM  NOTE: This note has been prepared using Dragon dictation software. Despite my best ability to proofread, there is always the potential that unintentional transcriptional errors may still occur from this process.

## 2021-07-29 ENCOUNTER — Encounter: Payer: Self-pay | Admitting: Urology

## 2021-07-29 NOTE — Progress Notes (Addendum)
°  Perioperative Services Pre-Admission/Anesthesia Testing    Date: 07/29/21  Name: Chayce Robbins MRN:   [id number]  Re: Clearance for surgery  Patient is scheduled to undergo urological procedure on 08/03/2021 with Dr. Hollice Espy.  We have been attempting to obtain cardiac clearance from his primary cardiologist.  After multiple faxes and phone calls, I received a return communication from clinical staff member Barnett Applebaum) at [facility].  Alternative fax number received [phone]).  [facility] staff member asking that form is resent to the new fax number and she would make efforts to get this taken care of ASAP.  Of note, surgeons office has also been in contact with [facility] and received yet another fax number [phone]) and was asked to send information there.  Forms were faxed again to both of the new numbers in efforts to obtain appropriate clinical documentation for patient to proceed with planned urological procedure scheduled on 08/03/2021.  Honor Loh, MSN, APRN, FNP-C, CEN [facility]  Peri-operative Services Nurse Practitioner Phone: [phone] 07/29/21 11:53 AM  NOTE: This note has been prepared using Dragon dictation software. Despite my best ability to proofread, there is always the potential that unintentional transcriptional errors may still occur from this process.

## 2021-07-29 NOTE — Progress Notes (Signed)
Perioperative Services  Pre-Admission/Anesthesia Testing Clinical Review  Date: 07/29/21  Patient Demographics:  Name: Joseph Schmitt DOB:   July 05, 1956 MRN:   [id number]  Planned Surgical Procedure(s):    Case: [id number] Date/Time: 08/03/21 1149   Procedure: CYSTOSCOPY/URETEROSCOPY/HOLMIUM LASER/STENT PLACEMENT-LEFT (POSSIBLE BILATERAL)   Anesthesia type: General   Pre-op diagnosis: Left Ureteral Stone, Bilateral Kidney stone   Location: [facility] OR ROOM 10 / [place] ORS FOR ANESTHESIA GROUP   Surgeons: Hollice Espy, MD   NOTE: Available PAT nursing documentation and vital signs have been reviewed. Clinical nursing staff has updated patient's PMH/PSHx, current medication list, and drug allergies/intolerances to ensure comprehensive history available to assist in medical decision making as it pertains to the aforementioned surgical procedure and anticipated anesthetic course. Extensive review of available clinical information performed. [place] PMH and PSHx updated with any diagnoses/procedures that  may have been inadvertently omitted during his intake with the pre-admission testing department's nursing staff.  Clinical Discussion:  Joseph Schmitt is a 66 y.o. male who is submitted for pre-surgical anesthesia review and clearance prior to him undergoing the above procedure. Patient has never been a smoker. Pertinent PMH includes: CAD (s/p CABG), STEMI, NSTEMI, ventricular fibrillation arrest, second-degree AV block (s/p PPM placement), severe aortic stenosis (s/p TAVR), iliac artery aneurysms, paroxysmal atrial fibrillation, diastolic dysfunction, aortic atherosclerosis, HTN, HLD, nephrolithiasis.  Patient is followed by cardiology Alyse Low, NP). He was last seen in the cardiology clinic on 07/02/2021; notes reviewed.  At the time of his clinic visit, patient doing well overall from a cardiovascular perspective.  He denied any episodes of chest pain, shortness of breath, PND, orthopnea,  palpitations, significant peripheral edema, vertiginous symptoms, or presyncope/syncope.  PMH significant for cardiovascular diagnoses.  Patient suffered an inferior wall STEMI on 09/23/2008.  During the course of his cardiac event, patient went into ventricular fibrillation x 2, with each requiring a single defibrillation to restore ROSC.    Diagnostic left heart catheterization was performed revealing multivessel CAD; 70% proximal RCA, 30% ostial LM, 30% LM, 99% mid LCx (culprit lesion), 50% distal LCx, 90% OM1, 20% OM3, 30% LPL1, 20% proximal LAD, 30% mid LAD-1, 80% mid LAD-2, and 90% D2.  Patient was referred to CVTS for consideration of CABG procedure.    Patient ultimately underwent 5 vessel CABG on 09/23/2008.  LIMA-LAD, SVG-D1, SVG-OM1, SVG-OM3, and SVG-PDA bypass grafts were placed.  Patient suffered an NSTEMI on 03/16/2016.  Diagnostic left heart catheterization was performed at that time revealing multivessel CAD; 100% proximal RCA, 100% proximal LAD, 100% ramus intermedius, 85% distal LCx-1, and 100% distal LCx-2.  LIMA-LAD and SVG-OM 3 bypass grafts were patent.  There were 60% stenoses within the SVG-RCA and SVG-D1 grafts.  Additionally, there was total occlusion with significant thrombus noted in the SVG-OM1 bypass graft.  Further intervention deferred opting for aggressive medical management as the SVG was not amenable to thrombectomy/PCI/stent placement.  Patient with severe aortic valve stenosis.  He underwent a TAVR procedure on 05/14/2020 whereby a 23 mm Edwards Sapien S3 ultra bioprosthetic valve was placed.  Patient developed symptomatic second-degree AV block.  He underwent PPM placement on 05/26/2020.  Patient with multiple iliac artery aneurysms.  CTA performed on 06/30/2020 showing a thrombosed RIGHT common iliac artery measuring 3.8 cm, a 4.6 cm LEFT common iliac artery, and a 4.2 cm RIGHT internal iliac artery.  Patient underwent endovascular stenting and repair on  08/19/2020.  Last TTE was performed on 06/03/2021 revealing normal left ventricular systolic function with moderate LVH; LVEF >55%.  Diastolic Doppler parameters consistent with pseudonormalization (G2DD).  Left atrium severely enlarged.  There was no significant valvular regurgitation.  Bioprosthetic aortic valve present and determined to be well-seated; mean transvalvular gradient 11 mmHg.  Patient with a paroxysmal atrial fibrillation diagnosis; CHA2DS2-VASc Score = 3 (age, HTN, previous MI).  Rate and rhythm maintained on oral metoprolol.  Patient is chronically anticoagulated using daily apixaban + clopidogrel + ASA; compliant with therapy with no evidence or reports of GI bleeding.  Blood pressure elevated at 175/69 on currently prescribed nitrate, ACEi, ARB, and beta-blocker therapies.  Patient is on a statin for his HLD.  He has SL NTG to use on a as needed basis; denies recent use.  Patient is not diabetic. Functional capacity, as defined by DASI, is documented as being >/= 4 METS.  Given patient's elevated blood pressure, patient was restarted on amlodipine.  No other changes were made to his medication regimen.  Patient to follow-up with outpatient cardiology in 1 year or sooner if needed.  Joseph Schmitt is scheduled for a CYSTOSCOPY/URETEROSCOPY/HOLMIUM LASER/LEFT STENT PLACEMENT (POSSIBLE BILATERAL) on 08/03/2021 with Dr. Hollice Espy, MD. Given patient's past medical history significant for cardiovascular diagnoses, presurgical cardiac clearance was sought by the PAT team. Per cardiology, "this patient is optimized for surgery and may proceed with the planned procedural course with a LOW risk of significant perioperative cardiovascular complications". He has been instructed on recommendation regarding his DAPT therapy by his cardiologist and surgeon.   Patient denies previous perioperative complications with anesthesia in the past. In review of the available records, it is noted that  patient underwent a general anesthetic course at [facility] (ASA III) in 07/2020 without documented complications.   Vitals with BMI 07/28/2021 06/05/2021 04/14/2021  Height 5' 11"  5' 11"  5' 11"   Weight 258 lbs 257 lbs 245 lbs  BMI 36 83.09 40.76  Systolic - 808 811  Diastolic - 031 98  Pulse - 80 71    Providers/Specialists:   NOTE: Primary physician provider listed below. Patient may have been seen by APP or partner within same practice.   PROVIDER ROLE / SPECIALTY LAST Lu Duffel, MD Urology 06/05/2021  Latanya Maudlin, NP Primary Care Provider 03/24/2021  Myriam Jacobson, MD Cardiology 07/02/2021   Allergies:  Patient has no known allergies.  Current Home Medications:   No current facility-administered medications for this encounter.    amLODipine (NORVASC) 10 MG tablet   aspirin 81 MG chewable tablet   cholecalciferol (VITAMIN D3) 25 MCG (1000 UNIT) tablet   cyanocobalamin 1000 MCG tablet   ELIQUIS 5 MG TABS tablet   losartan (COZAAR) 100 MG tablet   metoprolol tartrate (LOPRESSOR) 25 MG tablet   rosuvastatin (CRESTOR) 40 MG tablet   atorvastatin (LIPITOR) 40 MG tablet   clopidogrel (PLAVIX) 75 MG tablet   isosorbide mononitrate (IMDUR) 30 MG 24 hr tablet   lisinopril (PRINIVIL,ZESTRIL) 40 MG tablet   metoprolol tartrate (LOPRESSOR) 50 MG tablet   nitroGLYCERIN (NITROSTAT) 0.4 MG SL tablet   History:   Past Medical History:  Diagnosis Date   2nd degree AV block    a.) s/p PPM placement 05/26/2020   Adrenal adenoma, left    a.) CT 05/01/2021: measured 64m   Aortic atherosclerosis ([place]    Bilateral renal cysts 05/01/2021   Cardiac [place] with ventricular fibrillation [place] 09/23/2008   a.) in setting of acute inferior STEMI; 2 episodes requiring a single defibrillation each time prior to ROSC.  Chlamydia    Coronary artery disease    Diastolic dysfunction    a.)  TTE 06/03/2021: EF >55%; severe LA enlargement; bioprosthetic  AoV well-seated with a mean transvalvular gradient of 11 mmHg; G2DD.   Excessive daytime sleepiness    Heart murmur    History of kidney stones    History of transcatheter aortic valve replacement (TAVR) 05/14/2020   a.) 23 mm Edwards Sapien S3 Ultra bioprosthetic valve   Hypercholesteremia    Hypertension    Iliac artery aneurysm ([place]) 06/30/2020   a.) CTA 06/30/2020: RIGHT common iliac artery thrombsed and measured 3.8 cm, LEFT common iliac artery measure 4.6 cm, RIGHT internal iliac artery measure 4.2 cm; s/p endovascular stenting and repair 08/19/2020.   Long term current use of anticoagulant    a.) apixaban   Long term current use of antithrombotics/antiplatelets    a.) DAPT (ASA + clopidogrel)   NSTEMI (non-ST elevated myocardial infarction) ([place]) 03/15/2016   a.)  LHC 03/16/2016: 100% pRCA, 100% pLAD, 100% RI, 85% dLCx-1, 100% dLCx-2; LIMA-LAD and SVG-OM 3 bypass grafts patent.  60% stenosis of the SVG-RCA, 60% stenosis of the SVG-D1, and total occlusion with significant thrombus noted within the SVG-OM1 bypass graft.   PAF (paroxysmal atrial fibrillation) (HCC)    a.) CHA2DS2-VASc = 3 (age, HTN, previous MI). b.) Rate/rhythm maintained on daily metoprolol; carhnically anticoagulated with apixaban + ASA + clopidogrel   Presence of permanent cardiac pacemaker 05/26/2020   a.) Medtronic device   S/P CABG x 5 09/23/2008   a.) LIMA-LAD, SVG-D1, SVG-OM1, SVG-OM3, SVG-PDA   Severe aortic stenosis    a.) s/p TAVR 05/14/2020   ST elevation myocardial infarction (STEMI) of inferior wall ([place]) 09/23/2008   a.) VF arrest x 2; ROSC with defibrillation x 1 each time. LHC --> 70% pRCA, 30% oLM, 30% LM, 99% mLCx, 50% dLCx, 90% OM1, 20% OM3, 30% LPL1, 20% pLAD, 30% mLAD-1, 80% mLAD-2, 90% D2; refer to CVTS. Underwent 5v CABG 09/23/2008.   Past Surgical History:  Procedure Laterality Date   APPENDECTOMY     CARDIAC CATHETERIZATION N/A 03/16/2016   Procedure: Coronary/ grafts  Angiography;   Surgeon: [name], MD;  Location: [place] [facility];  Service: Cardiovascular;  Laterality: N/A;   CARDIAC CATHETERIZATION Left 07/26/2008   Procedure: CARDIAC CATHETERIZATION; Location: [facility]   COLONOSCOPY WITH PROPOFOL N/A 12/03/2019   Procedure: COLONOSCOPY WITH PROPOFOL;  Surgeon: [place], Benay Pike, MD;  Location: [facility] END[facility];  Service: Gastroenterology;  Laterality: N/A;   CORONARY ARTERY BYPASS GRAFT N/A 09/23/2008   CORONARY STENT PLACEMENT  2021   INSERT / REPLACE / REMOVE PACEMAKER  05/26/2020   Medtronic   KNEE ARTHROSCOPY Right    TRANSCATHETER AORTIC VALVE REPLACEMENT, TRANSAORTIC N/A 05/14/2020   Family History  Problem Relation Age of Onset   Hypertension Other    Social History   Tobacco Use   Smoking status: Never   Smokeless tobacco: Never  Vaping Use   Vaping Use: Never used  Substance Use Topics   Alcohol use: No   Drug use: Never    Pertinent Clinical Results:  LABS: Labs reviewed: Acceptable for surgery.   Ref Range & Units 07/02/2021  WBC (White Blood Cell Count) 3.2 - 9.8 x109/L 8.7   Hemoglobin 13.7 - 17.3 g/dL 13.0 Low    Hematocrit 39.0 - 49.0 % 39.7   Platelets 150 - 450 x109/L 220   MCV (Mean Corpuscular Volume) 80 - 98 fL 87   MCH (Mean Corpuscular  Hemoglobin) 26.5 - 34.0 pg 28.3   MCHC (Mean Corpuscular Hemoglobin Concentration) 31.5 - 36.3 % 32.7   RBC (Red Blood Cell Count) 4.37 - 5.74 x1012/L 4.59   RDW-CV (Red Cell Distribution Width) 11.5 - 14.5 % 14.1   MPV (Mean Platelet Volume) 7.2 - 11.7 fL 9.9   Resulting Agency  [facility]  Specimen Collected: 07/02/21 11:54 Last Resulted: 07/02/21 12:04  Received From: [place]  Result Received: 07/03/21 12:11    Ref Range & Units 07/02/2021  Sodium 135 - 145 mmol/L 138   Potassium 3.5 - 5.0 mmol/L 4.4   Chloride 98 - 108 mmol/L 105   Carbon Dioxide (CO2) 21 - 30 mmol/L 28   Urea Nitrogen (BUN) 7 - 20 mg/dL 17   Creatinine 0.6 -  1.3 mg/dL 1.1   Glucose 70 - 140 mg/dL 94   Calcium 8.7 - 10.2 mg/dL 9.3   Anion Gap 3 - 12 mmol/L 5   BUN/CREA Ratio 6 - 27 16   Glomerular Filtration Rate (eGFR)  mL/min/1.73sq m 74   Resulting Agency  [facility]  Specimen Collected: 07/02/21 11:54 Last Resulted: 07/02/21 15:18  Received From: [place]  Result Received: 07/03/21 12:11     ECG: Date: 07/02/2021 Rate: 57 bpm Rhythm:  Atrial ventricular dual paced rhythm Axis (leads I and aVF): Normal Intervals: PR 172 ms. QRS 192 ms. QTc 490 ms. ST segment and T wave changes: No evidence of acute ST segment elevation or depression Comparison: Similar to previous tracing obtained on 06/26/2020 NOTE: Tracing obtained at [facility]; unable for review. Above based on cardiologist's interpretation.    IMAGING / PROCEDURES: TRANSTHORACIC ECHOCARDIOGRAM performed on 06/03/2021 LVEF >55% Normal left ventricular systolic function with moderate LVH Elevated LA pressures with diastolic dysfunction; severely enlarged LA; pseudonormalization (G2DD). Normal right ventricular systolic function Trivial MR Mild AR No TR or PR Well-seated bioprosthetic AoV; 2.4 m/s peak velocity; 23 mmHg peak gradient; 11 mmHg mean gradient No pericardial effusion  CT HEMATURIA WORKUP performed on 05/01/2021 8 x 5 x 10 mm stone at or just distal to the left UPJ without hydronephrosis or substantial secondary changes in the left kidney. Bilateral nonobstructing renal stones. Bilateral renal cysts. 14 mm left adrenal adenoma. Status post aorto bi-iliac endograft placement. 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 Vascular surgery consultation may be warranted. 6.  Aortic atherosclerosis  CT AAA PROTOCOL performed on 10/18/2020 Status post endovascular stenting of the infrarenal abdominal aorta and common iliacs and external iliacs and left internal iliac arteries and  coiling of the right internal iliac with patent stents; there is a small (type 2) endoleak of the infrarenal abdominal aorta with overall stable size aorta at 26 x 29 mm. The IMA is patent and there is an endoleak seen on the arterial phased imaging and there are patent lumbar arteries favored to be a type II endoleak.  No evidence for an endoleak involving the bilateral common iliac arteries status post stenting. Relative decreased opacification arterially of the left femoral and proximal SFA and profunda compared to the right without definite stenosis or stent occlusion identified.   LEFT HEART CATHETERIZATION AND CORONARY ANGIOGRAPHY performed on 03/16/2016 LVEF 45% Multivessel CAD 100% proximal RCA 100% proximal LAD 100% ramus intermedius 85% distal LCx-1 100% distal LCx-2 100% distal LAD 70% insertion lesion 100% mid LAD Grafts LIMA-LAD and SVG-OM3 bypass graft patent 60% SVG-RCA 60% SVG-D1 Occlusion  with significant thrombosis throughout SVG-OM1 Recommendation: No further cardiac intervention at this time due to saphenous vein graft not amenable to thrombectomy and/or PCI and/or stent. Continue aggressive medical management.    Impression and Plan:  Joseph Schmitt has been referred for pre-anesthesia review and clearance prior to him undergoing the planned anesthetic and procedural courses. Available labs, pertinent testing, and imaging results were personally reviewed by me. This patient has been appropriately cleared by cardiology with an overall LOW risk of significant perioperative cardiovascular complications. Completed perioperative prescription for cardiac device management documentation completed by primary cardiology team and placed on patient's chart for review by the surgical/anesthetic team on the day of his procedure.   Based on clinical review performed today (07/29/21), barring any significant acute changes in the patient's overall condition, it is anticipated that  he will be able to proceed with the planned surgical intervention. Any acute changes in clinical condition may necessitate his procedure being postponed and/or cancelled. Patient will meet with anesthesia team (MD and/or CRNA) on the day of his procedure for preoperative evaluation/assessment. Questions regarding anesthetic course will be fielded at that time.   Pre-surgical instructions were reviewed with the patient during his PAT appointment and questions were fielded by PAT clinical staff. Patient was advised that if any questions or concerns arise prior to his procedure then he should return a call to PAT and/or his surgeon's office to discuss.  Honor Loh, MSN, APRN, FNP-C, CEN [facility]  Peri-operative Services Nurse Practitioner Phone: [phone] Fax: [phone] 07/29/21 1:41 PM  NOTE: This note has been prepared using Dragon dictation software. Despite my best ability to proofread, there is always the potential that unintentional transcriptional errors may still occur from this process.

## 2021-08-03 ENCOUNTER — Ambulatory Visit
Admission: RE | Admit: 2021-08-03 | Discharge: 2021-08-03 | Disposition: A | Discharge status: Home / Self Care | Payer: 59 | Attending: Urology | Admitting: Urology

## 2021-08-03 ENCOUNTER — Encounter
Admission: RE | Disposition: A | Discharge status: Home / Self Care | Payer: Self-pay | Source: Home / Self Care | Attending: Urology

## 2021-08-03 ENCOUNTER — Ambulatory Visit: Payer: 59 | Admitting: Urgent Care

## 2021-08-03 ENCOUNTER — Encounter: Payer: Self-pay | Admitting: Urology

## 2021-08-03 ENCOUNTER — Ambulatory Visit: Payer: 59

## 2021-08-03 DIAGNOSIS — Z953 Presence of xenogenic heart valve: Secondary | ICD-10-CM | POA: Insufficient documentation

## 2021-08-03 DIAGNOSIS — N201 Calculus of ureter: Secondary | ICD-10-CM

## 2021-08-03 DIAGNOSIS — I480 Paroxysmal atrial fibrillation: Secondary | ICD-10-CM | POA: Diagnosis not present

## 2021-08-03 DIAGNOSIS — R3129 Other microscopic hematuria: Secondary | ICD-10-CM | POA: Diagnosis not present

## 2021-08-03 DIAGNOSIS — Z7901 Long term (current) use of anticoagulants: Secondary | ICD-10-CM | POA: Diagnosis not present

## 2021-08-03 DIAGNOSIS — Z951 Presence of aortocoronary bypass graft: Secondary | ICD-10-CM | POA: Diagnosis not present

## 2021-08-03 DIAGNOSIS — N202 Calculus of kidney with calculus of ureter: Secondary | ICD-10-CM | POA: Diagnosis present

## 2021-08-03 DIAGNOSIS — Z79899 Other long term (current) drug therapy: Secondary | ICD-10-CM | POA: Diagnosis not present

## 2021-08-03 DIAGNOSIS — Z7982 Long term (current) use of aspirin: Secondary | ICD-10-CM | POA: Insufficient documentation

## 2021-08-03 DIAGNOSIS — I723 Aneurysm of iliac artery: Secondary | ICD-10-CM | POA: Diagnosis not present

## 2021-08-03 DIAGNOSIS — Z8674 Personal history of sudden cardiac arrest: Secondary | ICD-10-CM | POA: Diagnosis not present

## 2021-08-03 DIAGNOSIS — E785 Hyperlipidemia, unspecified: Secondary | ICD-10-CM | POA: Insufficient documentation

## 2021-08-03 DIAGNOSIS — I10 Essential (primary) hypertension: Secondary | ICD-10-CM | POA: Insufficient documentation

## 2021-08-03 DIAGNOSIS — I700 Atherosclerosis of aorta: Secondary | ICD-10-CM | POA: Diagnosis not present

## 2021-08-03 DIAGNOSIS — R319 Hematuria, unspecified: Secondary | ICD-10-CM

## 2021-08-03 DIAGNOSIS — Z955 Presence of coronary angioplasty implant and graft: Secondary | ICD-10-CM | POA: Insufficient documentation

## 2021-08-03 DIAGNOSIS — I2510 Atherosclerotic heart disease of native coronary artery without angina pectoris: Secondary | ICD-10-CM | POA: Diagnosis not present

## 2021-08-03 DIAGNOSIS — Z7902 Long term (current) use of antithrombotics/antiplatelets: Secondary | ICD-10-CM | POA: Insufficient documentation

## 2021-08-03 DIAGNOSIS — Z950 Presence of cardiac pacemaker: Secondary | ICD-10-CM | POA: Diagnosis not present

## 2021-08-03 DIAGNOSIS — I441 Atrioventricular block, second degree: Secondary | ICD-10-CM | POA: Insufficient documentation

## 2021-08-03 DIAGNOSIS — I252 Old myocardial infarction: Secondary | ICD-10-CM | POA: Diagnosis not present

## 2021-08-03 DIAGNOSIS — N200 Calculus of kidney: Secondary | ICD-10-CM

## 2021-08-03 HISTORY — DX: Long term (current) use of anticoagulants: Z79.01

## 2021-08-03 HISTORY — PX: CYSTOSCOPY/URETEROSCOPY/HOLMIUM LASER/STENT PLACEMENT: SHX6546

## 2021-08-03 HISTORY — DX: Other ill-defined heart diseases: I51.89

## 2021-08-03 HISTORY — DX: Atherosclerosis of aorta: I70.0

## 2021-08-03 HISTORY — DX: Atrioventricular block, second degree: I44.1

## 2021-08-03 HISTORY — DX: Other hypersomnia: G47.19

## 2021-08-03 HISTORY — DX: Nonrheumatic aortic (valve) stenosis: I35.0

## 2021-08-03 HISTORY — DX: Paroxysmal atrial fibrillation: I48.0

## 2021-08-03 HISTORY — DX: Benign neoplasm of left adrenal gland: D35.02

## 2021-08-03 HISTORY — DX: Long term (current) use of antithrombotics/antiplatelets: Z79.02

## 2021-08-03 SURGERY — CYSTOSCOPY/URETEROSCOPY/HOLMIUM LASER/STENT PLACEMENT
Anesthesia: General | Laterality: Left

## 2021-08-03 MED ORDER — FENTANYL CITRATE (PF) 100 MCG/2ML IJ SOLN
INTRAMUSCULAR | Status: AC
  Filled 2021-08-03: qty 2

## 2021-08-03 MED ORDER — PROPOFOL 10 MG/ML IV BOLUS
INTRAVENOUS | Status: DC | PRN
  Administered 2021-08-03: 50 mg via INTRAVENOUS
  Administered 2021-08-03: 150 mg via INTRAVENOUS

## 2021-08-03 MED ORDER — FENTANYL CITRATE (PF) 100 MCG/2ML IJ SOLN
INTRAMUSCULAR | Status: DC | PRN
  Administered 2021-08-03 (×2): 50 ug via INTRAVENOUS

## 2021-08-03 MED ORDER — ACETAMINOPHEN 10 MG/ML IV SOLN: 1000.0000 mg | Freq: Once | INTRAVENOUS | Status: DC | PRN

## 2021-08-03 MED ORDER — IOHEXOL 180 MG/ML  SOLN
INTRAMUSCULAR | Status: DC | PRN
  Administered 2021-08-03: 30 mL

## 2021-08-03 MED ORDER — SODIUM CHLORIDE 0.9 % IR SOLN
Status: DC | PRN
  Administered 2021-08-03: 3000 mL via INTRAVESICAL

## 2021-08-03 MED ORDER — TAMSULOSIN HCL 0.4 MG PO CAPS: 0.4000 mg | ORAL_CAPSULE | Freq: Every day | ORAL | 0 refills | Status: DC

## 2021-08-03 MED ORDER — FAMOTIDINE 20 MG PO TABS
ORAL_TABLET | ORAL | Status: AC
  Administered 2021-08-03: 20 mg via ORAL
  Filled 2021-08-03: qty 1

## 2021-08-03 MED ORDER — OXYCODONE HCL 5 MG/5ML PO SOLN: 5.0000 mg | Freq: Once | ORAL | Status: DC | PRN

## 2021-08-03 MED ORDER — ROCURONIUM BROMIDE 100 MG/10ML IV SOLN
INTRAVENOUS | Status: DC | PRN
  Administered 2021-08-03: 50 mg via INTRAVENOUS

## 2021-08-03 MED ORDER — ORAL CARE MOUTH RINSE: 15.0000 mL | Freq: Once | OROMUCOSAL | Status: AC

## 2021-08-03 MED ORDER — PHENYLEPHRINE 40 MCG/ML (10ML) SYRINGE FOR IV PUSH (FOR BLOOD PRESSURE SUPPORT)
PREFILLED_SYRINGE | INTRAVENOUS | Status: DC | PRN
  Administered 2021-08-03 (×2): 80 ug via INTRAVENOUS

## 2021-08-03 MED ORDER — CHLORHEXIDINE GLUCONATE 0.12 % MT SOLN: 15.0000 mL | Freq: Once | OROMUCOSAL | Status: AC

## 2021-08-03 MED ORDER — DROPERIDOL 2.5 MG/ML IJ SOLN
0.6250 mg | Freq: Once | INTRAMUSCULAR | Status: DC | PRN
  Filled 2021-08-03: qty 2

## 2021-08-03 MED ORDER — FAMOTIDINE 20 MG PO TABS: 20.0000 mg | ORAL_TABLET | Freq: Once | ORAL | Status: AC

## 2021-08-03 MED ORDER — PROMETHAZINE HCL 25 MG/ML IJ SOLN: 6.2500 mg | INTRAMUSCULAR | Status: DC | PRN

## 2021-08-03 MED ORDER — CEFAZOLIN SODIUM-DEXTROSE 2-4 GM/100ML-% IV SOLN
2.0000 g | INTRAVENOUS | Status: AC
  Administered 2021-08-03: 2 g via INTRAVENOUS

## 2021-08-03 MED ORDER — CHLORHEXIDINE GLUCONATE 0.12 % MT SOLN
OROMUCOSAL | Status: AC
  Administered 2021-08-03: 15 mL via OROMUCOSAL
  Filled 2021-08-03: qty 15

## 2021-08-03 MED ORDER — DEXAMETHASONE SODIUM PHOSPHATE 10 MG/ML IJ SOLN
INTRAMUSCULAR | Status: DC | PRN
  Administered 2021-08-03: 10 mg via INTRAVENOUS

## 2021-08-03 MED ORDER — FENTANYL CITRATE (PF) 100 MCG/2ML IJ SOLN: 25.0000 ug | INTRAMUSCULAR | Status: DC | PRN

## 2021-08-03 MED ORDER — EPHEDRINE SULFATE 50 MG/ML IJ SOLN
INTRAMUSCULAR | Status: DC | PRN
  Administered 2021-08-03: 10 mg via INTRAVENOUS
  Administered 2021-08-03: 15 mg via INTRAVENOUS

## 2021-08-03 MED ORDER — SUGAMMADEX SODIUM 200 MG/2ML IV SOLN
INTRAVENOUS | Status: DC | PRN
  Administered 2021-08-03: 200 mg via INTRAVENOUS

## 2021-08-03 MED ORDER — OXYBUTYNIN CHLORIDE 5 MG PO TABS: 5.0000 mg | ORAL_TABLET | Freq: Three times a day (TID) | ORAL | 0 refills | Status: DC | PRN

## 2021-08-03 MED ORDER — OXYCODONE HCL 5 MG PO TABS: 5.0000 mg | ORAL_TABLET | Freq: Once | ORAL | Status: DC | PRN

## 2021-08-03 MED ORDER — CEFAZOLIN SODIUM-DEXTROSE 2-4 GM/100ML-% IV SOLN
INTRAVENOUS | Status: AC
  Filled 2021-08-03: qty 100

## 2021-08-03 MED ORDER — LACTATED RINGERS IV SOLN: INTRAVENOUS | Status: DC

## 2021-08-03 MED ORDER — ONDANSETRON HCL 4 MG/2ML IJ SOLN
INTRAMUSCULAR | Status: DC | PRN
Start: 2021-08-03 — End: 2021-08-03
  Administered 2021-08-03: 4 mg via INTRAVENOUS

## 2021-08-03 MED ORDER — HYDROCODONE-ACETAMINOPHEN 5-325 MG PO TABS
1.0000 | ORAL_TABLET | Freq: Four times a day (QID) | ORAL | 0 refills | Status: DC | PRN
Start: 2021-08-03 — End: 2022-09-03

## 2021-08-03 SURGICAL SUPPLY — 33 items
ADH LQ OCL WTPRF AMP STRL LF (MISCELLANEOUS) ×1
ADHESIVE MASTISOL STRL (MISCELLANEOUS) ×1 IMPLANT
BAG DRAIN CYSTO-URO LG1000N (MISCELLANEOUS) ×2 IMPLANT
BASKET ZERO TIP 1.9FR (BASKET) ×1 IMPLANT
BRUSH SCRUB EZ 1% IODOPHOR (MISCELLANEOUS) ×2 IMPLANT
BSKT STON RTRVL ZERO TP 1.9FR (BASKET) ×1
CATH URET FLEX-TIP 2 LUMEN 10F (CATHETERS) ×1 IMPLANT
CATH URETL OPEN 5X70 (CATHETERS) ×2 IMPLANT
CNTNR SPEC 2.5X3XGRAD LEK (MISCELLANEOUS) ×1
CONT SPEC 4OZ STER OR WHT (MISCELLANEOUS) ×1
CONT SPEC 4OZ STRL OR WHT (MISCELLANEOUS) ×1
CONTAINER SPEC 2.5X3XGRAD LEK (MISCELLANEOUS) IMPLANT
DRAPE UTILITY 15X26 TOWEL STRL (DRAPES) ×2 IMPLANT
DRSG TEGADERM 2-3/8X2-3/4 SM (GAUZE/BANDAGES/DRESSINGS) ×1 IMPLANT
GAUZE 4X4 16PLY ~~LOC~~+RFID DBL (SPONGE) ×4 IMPLANT
GLOVE SURG ENC MOIS LTX SZ6.5 (GLOVE) ×2 IMPLANT
GOWN STRL REUS W/ TWL LRG LVL3 (GOWN DISPOSABLE) ×2 IMPLANT
GOWN STRL REUS W/TWL LRG LVL3 (GOWN DISPOSABLE) ×4
GUIDEWIRE GREEN .038 145CM (MISCELLANEOUS) ×1 IMPLANT
GUIDEWIRE STR DUAL SENSOR (WIRE) ×2 IMPLANT
INFUSOR MANOMETER BAG 3000ML (MISCELLANEOUS) ×2 IMPLANT
IV NS IRRIG 3000ML ARTHROMATIC (IV SOLUTION) ×2 IMPLANT
KIT TURNOVER CYSTO (KITS) ×2 IMPLANT
MANIFOLD NEPTUNE II (INSTRUMENTS) ×2 IMPLANT
PACK CYSTO AR (MISCELLANEOUS) ×2 IMPLANT
SET CYSTO W/LG BORE CLAMP LF (SET/KITS/TRAYS/PACK) ×2 IMPLANT
SHEATH URETERAL 12FRX35CM (MISCELLANEOUS) IMPLANT
STENT URET 6FRX24 CONTOUR (STENTS) IMPLANT
STENT URET 6FRX26 CONTOUR (STENTS) ×2 IMPLANT
SURGILUBE 2OZ TUBE FLIPTOP (MISCELLANEOUS) ×2 IMPLANT
TRACTIP FLEXIVA PULSE ID 200 (Laser) ×2 IMPLANT
WATER STERILE IRR 1000ML POUR (IV SOLUTION) ×2 IMPLANT
WATER STERILE IRR 500ML POUR (IV SOLUTION) ×2 IMPLANT

## 2021-08-03 NOTE — Transfer of Care (Addendum)
Immediate Anesthesia Transfer of Care Note  Patient: Joseph Schmitt  Procedure(s) Performed: CYSTOSCOPY/URETEROSCOPY/HOLMIUM LASER/STENT PLACEMENT-LEFT (POSSIBLE RIGHT) (Left)  Patient Location: PACU  Anesthesia Type:General  Level of Consciousness: drowsy  Airway & Oxygen Therapy: Patient Spontanous Breathing  Post-op Assessment: Report given to RN  Post vital signs: stable  Last Vitals:  Vitals Value Taken Time  BP    Temp    Pulse    Resp    SpO2      Last Pain:  Vitals:   08/03/21 1022  TempSrc: Temporal  PainSc: 0-No pain         Complications: No notable events documented.

## 2021-08-03 NOTE — Anesthesia Preprocedure Evaluation (Addendum)
Anesthesia Evaluation  Patient identified by MRN, date of birth, ID band Patient awake    Reviewed: Allergy & Precautions, NPO status , Patient's Chart, lab work & pertinent test results  History of Anesthesia Complications Negative for: history of anesthetic complications  Airway Mallampati: III  TM Distance: >3 FB Neck ROM: Full    Dental  (+) Partial Upper, Missing, Poor Dentition   Pulmonary neg sleep apnea, neg COPD, Not current smoker,  Possible OSA, test in a few weeks   Pulmonary exam normal breath sounds clear to auscultation       Cardiovascular Exercise Tolerance: Good hypertension, Pt. on medications and Pt. on home beta blockers + Past MI (2017 with cardiac arrest), + Cardiac Stents (11/2019), + CABG (2010 x 5) and + Peripheral Vascular Disease (s/p endovascular stenting and repair 08/19/2020.)  (-) CHF Normal cardiovascular exam+ dysrhythmias (PAF (paroxysmal atrial fibrillation)) + pacemaker (s/p PPM placement 05/26/2020) + Valvular Problems/Murmurs (s/p TAVR 2021)  Rhythm:Regular Rate:Normal  ECHO: TTE 06/03/2021: EF >55%; severe LA enlargement; bioprosthetic AoV well-seated with a mean transvalvular gradient of 11 mmHg; G2DD   Neuro/Psych neg Seizures    GI/Hepatic Neg liver ROS, neg GERD  ,  Endo/Other  neg diabetes  Renal/GU Renal diseaseLeft Ureteral Stone, Bilateral Kidney stone     Musculoskeletal   Abdominal (+) + obese,   Peds  Hematology   Anesthesia Other Findings   Reproductive/Obstetrics                           Anesthesia Physical  Anesthesia Plan  ASA: III  Anesthesia Plan: General   Post-op Pain Management:    Induction: Intravenous  PONV Risk Score and Plan: 2 and Ondansetron and Dexamethasone  Airway Management Planned: Oral ETT  Additional Equipment:   Intra-op Plan:   Post-operative Plan: Extubation in OR  Informed Consent: I have reviewed  the patients History and Physical, chart, labs and discussed the procedure including the risks, benefits and alternatives for the proposed anesthesia with the patient or authorized representative who has indicated his/her understanding and acceptance.     Dental advisory given  Plan Discussed with: CRNA and Anesthesiologist  Anesthesia Plan Comments:        Anesthesia Quick Evaluation

## 2021-08-03 NOTE — H&P (Signed)
H&P updated today No changes RRR CTAB   Joseph Schmitt [birth year] [id number]   Referring provider: [facility] [address] [place],  [place] [address]      Chief Complaint  Patient presents with   Follow-up   Nephrolithiasis      HPI: 66 year old male accompanied by his wife today to discuss a CT urogram results.  He initially presented for hematuria work-up.  He is found to have multiple bilateral kidney stones including a bladder stone.  He underwent cystoscopy at which time the bladder stone was noted to have resolved.  He had persistent microscopic hematuria.  He underwent CT urogram on 05/04/2021 CT abdomen and pelvis revealed 8 x 5 x 10 mm stone at or just distal to the left UPJ without hydronephrosis or substantial secondary changes in the left kidney. Bilateral nonobstructing renal stones. Bilateral renal cysts.14 mm left adrenal adenoma.   He denies have any any flank pain whatsoever.  He has not been symptomatic from the stones.  He is never required stone intervention.  He is on Eliquis and followed by cardiologist.  He has been able to stop his Eliquis for various procedures in the past.   He has persistent microscopic blood in his urine today.     PMH:     Past Medical History:  Diagnosis Date   Chlamydia     Coronary artery disease     Heart attack ([place])     Heart murmur     Hypercholesteremia     Hypertension        Surgical History:      Past Surgical History:  Procedure Laterality Date   APPENDECTOMY       CARDIAC CATHETERIZATION N/A 03/16/2016    Procedure: Coronary/ grafts  Angiography;  Surgeon: [name], MD;  Location: [facility];  Service: Cardiovascular;  Laterality: N/A;   COLONOSCOPY WITH PROPOFOL N/A 12/03/2019    Procedure: COLONOSCOPY WITH PROPOFOL;  Surgeon: Toledo, Benay Pike, MD;  Location: [facility];  Service: Gastroenterology;  Laterality: N/A;   CORONARY ARTERY BYPASS GRAFT       CORONARY  STENT PLACEMENT   2021      Home Medications:  Allergies as of 06/05/2021   No Known Allergies         Medication List           Accurate as of June 05, 2021  1:48 PM. If you have any questions, ask your nurse or doctor.              aspirin 81 MG chewable tablet Chew 1 tablet (81 mg total) by mouth daily.    atorvastatin 40 MG tablet Commonly known as: LIPITOR Take 1 tablet (40 mg total) by mouth daily at 6 PM.    clopidogrel 75 MG tablet Commonly known as: Plavix Take 1 tablet (75 mg total) by mouth daily.    Eliquis 5 MG Tabs tablet Generic drug: apixaban Take 5 mg by mouth 2 (two) times daily.    isosorbide mononitrate 30 MG 24 hr tablet Commonly known as: IMDUR Take 1 tablet (30 mg total) by mouth daily.    lisinopril 40 MG tablet Commonly known as: ZESTRIL Take 1 tablet (40 mg total) by mouth daily.    metoprolol tartrate 50 MG tablet Commonly known as: LOPRESSOR Take 1 tablet (50 mg total) by mouth 2 (two) times daily.    nitroGLYCERIN 0.4 MG SL tablet Commonly known as: NITROSTAT Place 1 tablet (  0.4 mg total) under the tongue every 5 (five) minutes as needed for chest pain.             Allergies: No Known Allergies   Family History:      Family History  Problem Relation Age of Onset   Hypertension Other        Social History:  reports that he has never smoked. He has never used smokeless tobacco. He reports that he does not drink alcohol and does not use drugs.     Physical Exam: BP (!) 220/100    Pulse 80    Ht 5\' 11"  (1.803 m)    Wt 257 lb (116.6 kg)    BMI 35.84 kg/m   Constitutional:  Alert and oriented, No acute distress.  Accompanied by his wife today. HEENT: [place] AT, moist mucus membranes.  Trachea midline, no masses. Cardiovascular: No clubbing, cyanosis, or edema. Skin: No rashes, bruises or suspicious lesions. Neurologic: Grossly intact, no focal deficits, moving all 4 extremities. Psychiatric: Normal mood and affect.    Laboratory Data: Recent Labs       Lab Results  Component Value Date    WBC 6.9 02/05/2021    HGB 12.9 (L) 02/05/2021    HCT 39.1 02/05/2021    MCV 83.7 02/05/2021    PLT 191 02/05/2021        Recent Labs       Lab Results  Component Value Date    CREATININE 1.20 05/01/2021        Recent Labs       Lab Results  Component Value Date    HGBA1C 5.7 03/15/2016        Urinalysis Component     Latest Ref Rng & Units 06/05/2021  Color, Urine     YELLOW YELLOW  Appearance     CLEAR CLOUDY (A)  Specific Gravity, Urine     1.005 - 1.030 >1.030 (H)  pH     5.0 - 8.0 5.0  Glucose, UA     NEGATIVE mg/dL NEGATIVE  Hgb urine dipstick     NEGATIVE LARGE (A)  Bilirubin Urine     NEGATIVE SMALL (A)  Ketones, ur     NEGATIVE mg/dL TRACE (A)  Protein     NEGATIVE mg/dL 100 (A)  Nitrite     NEGATIVE NEGATIVE  Leukocytes,Ua     NEGATIVE NEGATIVE  Squamous Epithelial / LPF     0 - 5 0-5  WBC, UA     0 - 5 WBC/hpf 0-5  RBC / HPF     0 - 5 RBC/hpf >50  Bacteria, UA     NONE SEEN RARE (A)  Ca Oxalate Crys, UA      PRESENT      Pertinent Imaging:   CT HEMATURIA WORKUP   Narrative CLINICAL DATA:  Intermittent gross hematuria.   EXAM: CT ABDOMEN AND PELVIS WITHOUT AND WITH CONTRAST   TECHNIQUE: Multidetector CT imaging of the abdomen and pelvis was performed following the standard protocol before and following the bolus administration of intravenous contrast.   CONTRAST:  144mL OMNIPAQUE IOHEXOL 350 MG/ML SOLN   COMPARISON:  CT stone study 02/05/2021.   FINDINGS: Lower chest: Unremarkable.   Hepatobiliary: No suspicious focal abnormality within the liver parenchyma. There is no evidence for gallstones, gallbladder wall thickening, or pericholecystic fluid. No intrahepatic or extrahepatic biliary dilation.   Pancreas: No focal mass lesion. No dilatation of the main duct. No intraparenchymal cyst. No peripancreatic edema.  Spleen: No splenomegaly.  No focal mass lesion.   Adrenals/Urinary Tract: Right adrenal gland unremarkable. 14 mm left adrenal nodule has average attenuation of 5 Hounsfield units on precontrast imaging compatible with adenoma.   Precontrast imaging shows a 4 mm nonobstructing stone in the upper pole right kidney with a 8 x 7 mm nonobstructing stone in the interpolar region. 3 additional stones are seen in the interpolar and lower pole region of the right kidney with the most dominant of these 3 in the lower interpolar region measuring 9 x 10 x 7 mm. No right ureteral stone.   4 stones are seen in the left kidney with vascular calcification noted in the left renal hilum. None of the stones or obstructing and the dominant stones are seen in the lower pole measuring in the 4-6 mm size range. Despite the lack of hydronephrosis or secondary changes in the left kidney, a 8 x 5 x 10 mm stone is identified at or just distal to the left UPJ. No other left ureteral stone. No bladder stone.   Imaging after IV contrast administration shows no suspicious enhancing mass lesion in either kidney. 2.1 cm well-defined homogeneous low-density lesion in the upper pole right kidney has attenuation higher than simple fluid but shows no enhancement after IV contrast administration consistent with Bosniak II cyst. Multiple benign cyst noted left kidney.   Delayed post-contrast imaging shows no wall thickening or soft tissue filling defect in either intrarenal collecting system or renal pelvis. 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 Both ureters are well opacified without evidence for wall thickening, soft tissue lesion or focal dilatation. Delayed imaging of the bladder shows no focal wall thickening or mass lesion.   Stomach/Bowel: Stomach is unremarkable. No gastric wall thickening. No  evidence of outlet obstruction. Duodenum is normally positioned as is the ligament of Treitz. No small bowel wall thickening. No small bowel dilatation. The terminal ileum is normal. The appendix is not well visualized, but there is no edema or inflammation in the region of the cecum. No gross colonic mass. No colonic wall thickening.   Vascular/Lymphatic: Status post aorto bi-iliac endograft placement including limbs to both the internal and external iliac arteries on the left. Embolization coils noted right pelvic sidewall. 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 Left common iliac artery native aneurysm sac measures 3.7 cm diameter. 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 There is no gastrohepatic or hepatoduodenal ligament lymphadenopathy. No retroperitoneal or mesenteric lymphadenopathy. No pelvic sidewall lymphadenopathy.   Reproductive: The prostate gland and seminal vesicles are unremarkable.   Other: No intraperitoneal free fluid.   Musculoskeletal: No worrisome lytic or sclerotic osseous abnormality.   IMPRESSION: 1. 8 x 5 x 10 mm stone at or just distal to the left UPJ without hydronephrosis or substantial secondary changes in the left kidney. 2. Bilateral nonobstructing renal stones. 3. Bilateral renal cysts. 4. 14 mm left adrenal adenoma. 5. Status post aorto bi-iliac endograft placement. 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 Vascular surgery consultation may be warranted. 6.  Aortic Atherosclerois (ICD10-170.0)     Electronically Signed By: Misty Stanley M.D. On: 05/04/2021 14:50   The above CT scan was personally reviewed today and with the patient in the room.  I showed him his images and compared it to his previous CT scan.  He has had interval progression of 10 mm stone  into the left proximal ureter with hydronephrosis along with other bilateral nonobstructing stones of fairly significant cysts size.  KUB was also personally reviewed today.  The proximal ureteral stone was not clearly seen but there is distortion from overlying graft.     Assessment & Plan:     1. Left ureteral stone Obstructing left proximal ureteral calculus, not easily seen on KUB today but still has persistent microscopic blood   We discussed various treatment options for urolithiasis including observation with or without medical expulsive therapy, shockwave lithotripsy (SWL), ureteroscopy and laser lithotripsy with stent placement, and percutaneous nephrolithotomy.   We discussed that management is based on stone size, location, density, patient co-morbidities, and patient preference.    Stones <65mm in size have a >80% spontaneous passage rate. Data surrounding the use of tamsulosin for medical expulsive therapy is controversial, but meta analyses suggests it is most efficacious for distal stones between 5-31mm in size. Possible side effects include dizziness/lightheadedness, and retrograde ejaculation.   SWL has a lower stone free rate in a single procedure, but also a lower complication rate compared to ureteroscopy and avoids a stent and associated stent related symptoms. Possible complications include renal hematoma, steinstrasse, and need for additional treatment. We discussed the role of his increased skin to stone distance can lead to decreased efficacy with shockwave lithotripsy.   Ureteroscopy with laser lithotripsy and stent placement has a higher stone free rate than SWL in a single procedure, however increased complication rate including possible infection, ureteral injury, bleeding, and stent related morbidity. Common stent related symptoms include dysuria, urgency/frequency, and flank pain.   After an extensive discussion of the risks and benefits of the above treatment  options, the patient would like to proceed with left ureteroscopy   He will need cardiac clearance.  Ideally, he can hold his Eliquis prior to the procedure.   - Urinalysis, Complete w Microscopic; Future - Urine Culture; Future   2. Kidney stones Will treat left-sided nonobstructing stones, possibly proceed with right ureteroscopy depending on how the left goes   3. Microscopic hematuria Likely secondary to #1       Return for surgery will call to schedule.   Hollice Espy, MD   [facility] [address], [place],  [address] [phone]

## 2021-08-03 NOTE — Anesthesia Postprocedure Evaluation (Signed)
Anesthesia Post Note  Patient: Joseph Schmitt  Procedure(s) Performed: CYSTOSCOPY/URETEROSCOPY/HOLMIUM LASER/STENT PLACEMENT-LEFT (POSSIBLE RIGHT) (Left)  Patient location during evaluation: PACU Anesthesia Type: General Level of consciousness: awake and alert Pain management: pain level controlled Vital Signs Assessment: post-procedure vital signs reviewed and stable Respiratory status: spontaneous breathing, nonlabored ventilation, respiratory function stable and patient connected to nasal cannula oxygen Cardiovascular status: blood pressure returned to baseline and stable Postop Assessment: no apparent nausea or vomiting Anesthetic complications: no   No notable events documented.   Last Vitals:  Vitals:   08/03/21 1407 08/03/21 1451  BP: (!) 184/85 (!) 167/92  Pulse: 67 70  Resp: 16 16  Temp: (!) 36.2 C   SpO2: 98% 94%    Last Pain:  Vitals:   08/03/21 1451  TempSrc:   PainSc: 0-No pain                 Arita Miss

## 2021-08-03 NOTE — Anesthesia Procedure Notes (Signed)
Procedure Name: Intubation Date/Time: 08/03/2021 12:01 PM Performed by: Kerri Perches, CRNA Pre-anesthesia Checklist: Patient identified, Patient being monitored, Timeout performed, Emergency Drugs available and Suction available Patient Re-evaluated:Patient Re-evaluated prior to induction Oxygen Delivery Method: Circle system utilized Preoxygenation: Pre-oxygenation with 100% oxygen Induction Type: IV induction Ventilation: Oral airway inserted - appropriate to patient size Laryngoscope Size: McGraph and 4 Grade View: Grade I Tube type: Oral Tube size: 7.5 mm Number of attempts: 3 (1st attempt w/ Sabra Heck 3, 2nd attempt w/ MAC 4) Airway Equipment and Method: Stylet Placement Confirmation: ETT inserted through vocal cords under direct vision, positive ETCO2 and breath sounds checked- equal and bilateral Secured at: 23 cm Tube secured with: Tape Dental Injury: Teeth and Oropharynx as per pre-operative assessment

## 2021-08-03 NOTE — Op Note (Addendum)
Date of procedure: 08/03/21  Preoperative diagnosis:  Left ureteral calculus Bilateral nephrolithiasis Hematuria  Postoperative diagnosis:  Same as above  Procedure: Bilateral ureteroscopy laser lithotripsy Bilateral retrograde pyelogram Bilateral ureteral stent placement Basket extraction of stone fragment Interpretation of fluoroscopy less than 30 minutes  Surgeon: Hollice Espy, MD  Anesthesia: General  Complications: None  Intraoperative findings: 1 cm stone at the level of the iliacs treated via semirigid ureteroscopy on the left.  Additional upper tract stones also dusted on the left.  Bifid left ureter to the level of the mid ureter, able to treat and dust all upper moiety stones however difficulty entering lower pole system due to more narrow/delicate ureter thus the stones are left untreated.  Bilateral ureteral stent on tether.  EBL: Minimal  Specimens: None fragment  Drains: 6 x 26 French double-J ureteral stent bilaterally on tether  Indication: Joseph Schmitt is a 66 y.o. patient with painless gross hematuria found to have a 1 cm left proximal ureteral calculus as well as multiple nonobstructing kidney stones.  After reviewing the management options for treatment, he elected to proceed with the above surgical procedure(s). We have discussed the potential benefits and risks of the procedure, side effects of the proposed treatment, the likelihood of the patient achieving the goals of the procedure, and any potential problems that might occur during the procedure or recuperation. Informed consent has been obtained.  Description of procedure:  The patient was taken to the operating room and general anesthesia was induced.  The patient was placed in the dorsal lithotomy position, prepped and draped in the usual sterile fashion, and preoperative antibiotics were administered. A preoperative time-out was performed.   Scout image was obtained which showed a coarse  calcification along the left distal ureter near the level of the iliacs.  This was suspicious for retained stone at this location.  I advanced a 57 [place] scope per urethra into the bladder.  Attention was turned to the left ureteral orifice.  It was cannulated using a 5 [place] open-ended ureteral catheter and contrast was injected confirming that this calcification was likely within the ureter and there is a small filling defect at this location.  There is no overt proximal hydronephrosis.  A sensor wire was then placed up to the level of the kidney which went easily.  This was snapped in place as a safety wire.  A 4.5 French semirigid ureteroscope was then advanced to the distal ureter to the level of the stone.  A 242 m laser fiber was then brought in and using dusting settings of 0.3 J and 80 Hz, the stone was fragmented.  I later also had used 0.8 and 1.0 as additional settings to fragment additional fragments.  I used a 1.9 French tipless nitinol basket to clear the entire distal ureter all the stone debris.  Next, I advanced a Super Stiff wire as a working wire all the way up to the level of the kidney.  I then removed the semirigid ureteroscope and advanced a navigator access sheath, 11/13 French to the level of the proximal ureter which went easily.  A digital flexible dual-lumen ureteroscope was used at this point to navigate through the upper TRUS collecting system which was somewhat bifid in nature without a complete duplication.  Several stones of various sizes were dusted using the dusting settings as outlined above.  I was able to clear the entire renal unit.  Additional contrast was injected to ensure that each and every calyx was  visualized.  Once the entirety of the upper tract was cleared, the scope was backed along the ureter inspecting along the way.  There is no ureteral injuries or additional fragments appreciated.  Next, attention was turned to the right side.  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  Is able to get a wire to the upper pole moiety and then used a second dual-lumen access sheath to advance a second Super Stiff wire as a working wire.  I was able to advance the same access sheath just distal to the bifurcation and advance the ureteroscope through this up into the upper pole moiety where all stones were fragmented unfortunately, the lower pole moiety opening at the bifurcation of the ureter was somewhat narrow and I was not able to navigate into this lower pole portion of the system easily and thus the stones were not treated.  I then backed the scope down the length of the ureter inspecting along the way.  No additional injuries or fragments were appreciated.  I did advance a 6 x 26 French double-J ureteral stent up on this side as well as on the other side which was placed fluoroscopically.  Both of the stents were kept on a tether and after the bladder was drained, these were secured to the glans using Mastisol Tegaderm.  The patient was then cleaned and dried, repositioned to supine position, reversed from anesthesia, and taken to the PACU in stable condition.  Plan: We will have him remove his own stent on Friday.  He will follow-up in 4 weeks with a renal ultrasound prior.   Hollice Espy, M.D.

## 2021-08-03 NOTE — OR Nursing (Signed)
Per [place], [place]- per Dr. Erlene Quan, pt may resume taking eliquis today 08/03/21 - added to d/c instructions.

## 2021-08-03 NOTE — Discharge Instructions (Addendum)
You have a ureteral stent in place.  This is a tube that extends from your kidney to your bladder.  This may cause urinary bleeding, burning with urination, and urinary frequency.  Please call our office or present to the ED if you develop fevers >101 or pain which is not able to be controlled with oral pain medications.  You may be given either Flomax and/ or ditropan to help with bladder spasms and stent pain in addition to pain medications.    Your stent is on a string.  On Friday morning, you can untape and pull gently until the entire stent is removed.  If you have any difficulty, feel free to call our office and we will be glad to assist you.  [facility] [address] [place], [place] [address] [phone]     AMBULATORY SURGERY  DISCHARGE INSTRUCTIONS   The drugs that you were given will stay in your system until tomorrow so for the next 24 hours you should not:  Drive an automobile Make any legal decisions Drink any alcoholic beverage   You may resume regular meals tomorrow.  Today it is better to start with liquids and gradually work up to solid foods.  You may eat anything you prefer, but it is better to start with liquids, then soup and crackers, and gradually work up to solid foods.   Please notify your doctor immediately if you have any unusual bleeding, trouble breathing, redness and pain at the surgery site, drainage, fever, or pain not relieved by medication.    Additional Instructions:        Please contact your physician with any problems or Same Day Surgery at [phone], Monday through Friday 6 am to 4 pm, or [place] at [facility] number at [phone].

## 2021-08-06 ENCOUNTER — Other Ambulatory Visit: Payer: Self-pay

## 2021-08-06 DIAGNOSIS — N200 Calculus of kidney: Secondary | ICD-10-CM

## 2021-08-07 LAB — CALCULI, WITH PHOTOGRAPH (CLINICAL LAB)
Calcium Oxalate Dihydrate: 20 %
Calcium Oxalate Monohydrate: 80 %
Weight Calculi: 13 mg

## 2021-08-09 ENCOUNTER — Encounter: Payer: Self-pay | Admitting: Urology

## 2021-08-10 ENCOUNTER — Telehealth: Payer: Self-pay | Admitting: Family Medicine

## 2021-08-10 ENCOUNTER — Other Ambulatory Visit: Payer: Self-pay

## 2021-08-10 ENCOUNTER — Inpatient Hospital Stay
Admission: EM | Admit: 2021-08-10 | Discharge: 2021-08-12 | DRG: 872 | Disposition: A | Discharge status: Home / Self Care | Payer: 59 | Attending: Student | Admitting: Student

## 2021-08-10 ENCOUNTER — Emergency Department: Payer: 59

## 2021-08-10 DIAGNOSIS — I5032 Chronic diastolic (congestive) heart failure: Secondary | ICD-10-CM | POA: Diagnosis present

## 2021-08-10 DIAGNOSIS — E669 Obesity, unspecified: Secondary | ICD-10-CM | POA: Diagnosis present

## 2021-08-10 DIAGNOSIS — E119 Type 2 diabetes mellitus without complications: Secondary | ICD-10-CM | POA: Diagnosis present

## 2021-08-10 DIAGNOSIS — Z8674 Personal history of sudden cardiac arrest: Secondary | ICD-10-CM | POA: Diagnosis not present

## 2021-08-10 DIAGNOSIS — N390 Urinary tract infection, site not specified: Secondary | ICD-10-CM | POA: Diagnosis not present

## 2021-08-10 DIAGNOSIS — I350 Nonrheumatic aortic (valve) stenosis: Secondary | ICD-10-CM | POA: Diagnosis present

## 2021-08-10 DIAGNOSIS — E876 Hypokalemia: Secondary | ICD-10-CM | POA: Diagnosis present

## 2021-08-10 DIAGNOSIS — I959 Hypotension, unspecified: Secondary | ICD-10-CM | POA: Diagnosis present

## 2021-08-10 DIAGNOSIS — A419 Sepsis, unspecified organism: Secondary | ICD-10-CM | POA: Diagnosis not present

## 2021-08-10 DIAGNOSIS — R6520 Severe sepsis without septic shock: Secondary | ICD-10-CM | POA: Diagnosis present

## 2021-08-10 DIAGNOSIS — I4430 Unspecified atrioventricular block: Secondary | ICD-10-CM | POA: Diagnosis present

## 2021-08-10 DIAGNOSIS — G252 Other specified forms of tremor: Secondary | ICD-10-CM | POA: Diagnosis present

## 2021-08-10 DIAGNOSIS — I2510 Atherosclerotic heart disease of native coronary artery without angina pectoris: Secondary | ICD-10-CM | POA: Diagnosis present

## 2021-08-10 DIAGNOSIS — N400 Enlarged prostate without lower urinary tract symptoms: Secondary | ICD-10-CM | POA: Diagnosis present

## 2021-08-10 DIAGNOSIS — I252 Old myocardial infarction: Secondary | ICD-10-CM | POA: Diagnosis not present

## 2021-08-10 DIAGNOSIS — Z952 Presence of prosthetic heart valve: Secondary | ICD-10-CM

## 2021-08-10 DIAGNOSIS — R778 Other specified abnormalities of plasma proteins: Secondary | ICD-10-CM | POA: Diagnosis not present

## 2021-08-10 DIAGNOSIS — Z950 Presence of cardiac pacemaker: Secondary | ICD-10-CM | POA: Diagnosis not present

## 2021-08-10 DIAGNOSIS — Z960 Presence of urogenital implants: Secondary | ICD-10-CM | POA: Diagnosis present

## 2021-08-10 DIAGNOSIS — I248 Other forms of acute ischemic heart disease: Secondary | ICD-10-CM | POA: Diagnosis present

## 2021-08-10 DIAGNOSIS — E7800 Pure hypercholesterolemia, unspecified: Secondary | ICD-10-CM | POA: Diagnosis present

## 2021-08-10 DIAGNOSIS — Z7901 Long term (current) use of anticoagulants: Secondary | ICD-10-CM

## 2021-08-10 DIAGNOSIS — R55 Syncope and collapse: Secondary | ICD-10-CM | POA: Diagnosis not present

## 2021-08-10 DIAGNOSIS — R32 Unspecified urinary incontinence: Secondary | ICD-10-CM | POA: Diagnosis present

## 2021-08-10 DIAGNOSIS — Z6832 Body mass index (BMI) 32.0-32.9, adult: Secondary | ICD-10-CM

## 2021-08-10 DIAGNOSIS — A4150 Gram-negative sepsis, unspecified: Secondary | ICD-10-CM | POA: Diagnosis present

## 2021-08-10 DIAGNOSIS — A4151 Sepsis due to Escherichia coli [E. coli]: Secondary | ICD-10-CM | POA: Diagnosis present

## 2021-08-10 DIAGNOSIS — I110 Hypertensive heart disease with heart failure: Secondary | ICD-10-CM | POA: Diagnosis present

## 2021-08-10 DIAGNOSIS — I480 Paroxysmal atrial fibrillation: Secondary | ICD-10-CM | POA: Diagnosis present

## 2021-08-10 DIAGNOSIS — Z20822 Contact with and (suspected) exposure to covid-19: Secondary | ICD-10-CM | POA: Diagnosis present

## 2021-08-10 DIAGNOSIS — Z8249 Family history of ischemic heart disease and other diseases of the circulatory system: Secondary | ICD-10-CM | POA: Diagnosis not present

## 2021-08-10 DIAGNOSIS — Z951 Presence of aortocoronary bypass graft: Secondary | ICD-10-CM | POA: Diagnosis not present

## 2021-08-10 DIAGNOSIS — N179 Acute kidney failure, unspecified: Secondary | ICD-10-CM | POA: Diagnosis not present

## 2021-08-10 DIAGNOSIS — Z7902 Long term (current) use of antithrombotics/antiplatelets: Secondary | ICD-10-CM

## 2021-08-10 DIAGNOSIS — R269 Unspecified abnormalities of gait and mobility: Secondary | ICD-10-CM | POA: Diagnosis not present

## 2021-08-10 DIAGNOSIS — Z955 Presence of coronary angioplasty implant and graft: Secondary | ICD-10-CM

## 2021-08-10 DIAGNOSIS — Z7982 Long term (current) use of aspirin: Secondary | ICD-10-CM

## 2021-08-10 DIAGNOSIS — Z79899 Other long term (current) drug therapy: Secondary | ICD-10-CM

## 2021-08-10 DIAGNOSIS — R2689 Other abnormalities of gait and mobility: Secondary | ICD-10-CM | POA: Diagnosis present

## 2021-08-10 LAB — URINALYSIS, COMPLETE (UACMP) WITH MICROSCOPIC
Bilirubin Urine: NEGATIVE
Glucose, UA: NEGATIVE mg/dL
Ketones, ur: NEGATIVE mg/dL
Nitrite: NEGATIVE
Protein, ur: 100 mg/dL — AB
RBC / HPF: >50 RBC/hpf — ABNORMAL HIGH (ref 0–5)
Specific Gravity, Urine: 1.015 (ref 1.005–1.030)
Squamous Epithelial / HPF: NONE SEEN (ref 0–5)
WBC, UA: >50 WBC/hpf — ABNORMAL HIGH (ref 0–5)
pH: 5 (ref 5.0–8.0)

## 2021-08-10 LAB — TROPONIN I (HIGH SENSITIVITY)
Troponin I (High Sensitivity): 117 ng/L (ref <18)
Troponin I (High Sensitivity): 99 ng/L — ABNORMAL HIGH (ref <18)

## 2021-08-10 LAB — CBC WITH DIFFERENTIAL/PLATELET
Abs Immature Granulocytes: 0.07 10*3/uL (ref 0.00–0.07)
Basophils Absolute: 0.1 10*3/uL (ref 0.0–0.1)
Basophils Relative: 0 %
Eosinophils Absolute: 0.1 10*3/uL (ref 0.0–0.5)
Eosinophils Relative: 1 %
HCT: 36.2 % — ABNORMAL LOW (ref 39.0–52.0)
Hemoglobin: 12.1 g/dL — ABNORMAL LOW (ref 13.0–17.0)
Immature Granulocytes: 1 %
Lymphocytes Relative: 6 %
Lymphs Abs: 0.7 10*3/uL (ref 0.7–4.0)
MCH: 28.7 pg (ref 26.0–34.0)
MCHC: 33.4 g/dL (ref 30.0–36.0)
MCV: 85.8 fL (ref 80.0–100.0)
Monocytes Absolute: 1.4 10*3/uL — ABNORMAL HIGH (ref 0.1–1.0)
Monocytes Relative: 11 %
Neutro Abs: 10 10*3/uL — ABNORMAL HIGH (ref 1.7–7.7)
Neutrophils Relative %: 81 %
Platelets: 191 10*3/uL (ref 150–400)
RBC: 4.22 MIL/uL (ref 4.22–5.81)
RDW: 14.9 % (ref 11.5–15.5)
WBC: 12.3 10*3/uL — ABNORMAL HIGH (ref 4.0–10.5)
nRBC: 0 % (ref 0.0–0.2)

## 2021-08-10 LAB — COMPREHENSIVE METABOLIC PANEL
ALT: 35 U/L (ref 0–44)
AST: 36 U/L (ref 15–41)
Albumin: 3.3 g/dL — ABNORMAL LOW (ref 3.5–5.0)
Alkaline Phosphatase: 76 U/L (ref 38–126)
Anion gap: 9 (ref 5–15)
BUN: 27 mg/dL — ABNORMAL HIGH (ref 8–23)
CO2: 25 mmol/L (ref 22–32)
Calcium: 8.9 mg/dL (ref 8.9–10.3)
Chloride: 102 mmol/L (ref 98–111)
Creatinine, Ser: 1.65 mg/dL — ABNORMAL HIGH (ref 0.61–1.24)
GFR, Estimated: 46 mL/min — ABNORMAL LOW (ref >60)
Glucose, Bld: 129 mg/dL — ABNORMAL HIGH (ref 70–99)
Potassium: 3.5 mmol/L (ref 3.5–5.1)
Sodium: 136 mmol/L (ref 135–145)
Total Bilirubin: 1.1 mg/dL (ref 0.3–1.2)
Total Protein: 7.5 g/dL (ref 6.5–8.1)

## 2021-08-10 LAB — RESP PANEL BY RT-PCR (FLU A&B, COVID) ARPGX2
Influenza A by PCR: NEGATIVE
Influenza B by PCR: NEGATIVE
SARS Coronavirus 2 by RT PCR: NEGATIVE

## 2021-08-10 LAB — LACTIC ACID, PLASMA
Lactic Acid, Venous: 1.2 mmol/L (ref 0.5–1.9)
Lactic Acid, Venous: 1.6 mmol/L (ref 0.5–1.9)

## 2021-08-10 LAB — PROCALCITONIN: Procalcitonin: 0.84 ng/mL

## 2021-08-10 MED ORDER — AMLODIPINE BESYLATE 10 MG PO TABS
10.0000 mg | ORAL_TABLET | Freq: Every day | ORAL | Status: DC
  Administered 2021-08-11 – 2021-08-12 (×2): 10 mg via ORAL
  Filled 2021-08-10: qty 2
  Filled 2021-08-10: qty 1

## 2021-08-10 MED ORDER — ISOSORBIDE MONONITRATE ER 60 MG PO TB24: 30.0000 mg | ORAL_TABLET | Freq: Every day | ORAL | Status: DC

## 2021-08-10 MED ORDER — TAMSULOSIN HCL 0.4 MG PO CAPS
0.4000 mg | ORAL_CAPSULE | Freq: Every day | ORAL | Status: DC
  Administered 2021-08-11 – 2021-08-12 (×3): 0.4 mg via ORAL
  Filled 2021-08-10 (×3): qty 1

## 2021-08-10 MED ORDER — ONDANSETRON HCL 4 MG/2ML IJ SOLN: 4.0000 mg | Freq: Four times a day (QID) | INTRAMUSCULAR | Status: DC | PRN

## 2021-08-10 MED ORDER — ONDANSETRON HCL 4 MG PO TABS: 4.0000 mg | ORAL_TABLET | Freq: Four times a day (QID) | ORAL | Status: DC | PRN

## 2021-08-10 MED ORDER — NITROGLYCERIN 0.4 MG SL SUBL: 0.4000 mg | SUBLINGUAL_TABLET | SUBLINGUAL | Status: DC | PRN

## 2021-08-10 MED ORDER — ROSUVASTATIN CALCIUM 10 MG PO TABS
40.0000 mg | ORAL_TABLET | Freq: Every day | ORAL | Status: DC
  Administered 2021-08-11 – 2021-08-12 (×2): 40 mg via ORAL
  Filled 2021-08-10: qty 2
  Filled 2021-08-10: qty 4

## 2021-08-10 MED ORDER — IOHEXOL 350 MG/ML SOLN
75.0000 mL | Freq: Once | INTRAVENOUS | Status: AC | PRN
  Administered 2021-08-10: 75 mL via INTRAVENOUS

## 2021-08-10 MED ORDER — ASPIRIN EC 325 MG PO TBEC
325.0000 mg | DELAYED_RELEASE_TABLET | Freq: Every day | ORAL | Status: DC
  Administered 2021-08-10 – 2021-08-12 (×3): 325 mg via ORAL
  Filled 2021-08-10 (×3): qty 1

## 2021-08-10 MED ORDER — LOSARTAN POTASSIUM 50 MG PO TABS
100.0000 mg | ORAL_TABLET | Freq: Every day | ORAL | Status: DC
  Administered 2021-08-11 – 2021-08-12 (×2): 100 mg via ORAL
  Filled 2021-08-10 (×2): qty 2

## 2021-08-10 MED ORDER — OXYBUTYNIN CHLORIDE 5 MG PO TABS
5.0000 mg | ORAL_TABLET | Freq: Three times a day (TID) | ORAL | Status: DC | PRN
  Filled 2021-08-10: qty 1

## 2021-08-10 MED ORDER — IOHEXOL 300 MG/ML  SOLN: 75.0000 mL | Freq: Once | INTRAMUSCULAR | Status: DC | PRN

## 2021-08-10 MED ORDER — APIXABAN 5 MG PO TABS
5.0000 mg | ORAL_TABLET | Freq: Two times a day (BID) | ORAL | Status: DC
  Administered 2021-08-11 – 2021-08-12 (×4): 5 mg via ORAL
  Filled 2021-08-10 (×4): qty 1

## 2021-08-10 MED ORDER — ACETAMINOPHEN 325 MG PO TABS: 650.0000 mg | ORAL_TABLET | Freq: Four times a day (QID) | ORAL | Status: DC | PRN

## 2021-08-10 MED ORDER — VITAMIN B-12 1000 MCG PO TABS
1000.0000 ug | ORAL_TABLET | Freq: Every day | ORAL | Status: DC
  Administered 2021-08-11 – 2021-08-12 (×2): 1000 ug via ORAL
  Filled 2021-08-10 (×2): qty 1

## 2021-08-10 MED ORDER — POTASSIUM CHLORIDE IN NACL 20-0.9 MEQ/L-% IV SOLN
INTRAVENOUS | Status: DC
  Filled 2021-08-10 (×4): qty 1000

## 2021-08-10 MED ORDER — SODIUM CHLORIDE 0.9 % IV SOLN
2.0000 g | INTRAVENOUS | Status: DC
  Administered 2021-08-11 – 2021-08-12 (×2): 2 g via INTRAVENOUS
  Filled 2021-08-10 (×2): qty 20
  Filled 2021-08-10: qty 2

## 2021-08-10 MED ORDER — METOPROLOL TARTRATE 50 MG PO TABS
50.0000 mg | ORAL_TABLET | Freq: Two times a day (BID) | ORAL | Status: DC
  Administered 2021-08-11 – 2021-08-12 (×4): 50 mg via ORAL
  Filled 2021-08-10 (×4): qty 1

## 2021-08-10 MED ORDER — ATORVASTATIN CALCIUM 20 MG PO TABS: 40.0000 mg | ORAL_TABLET | Freq: Every day | ORAL | Status: DC

## 2021-08-10 MED ORDER — ASPIRIN 81 MG PO CHEW: 81.0000 mg | CHEWABLE_TABLET | Freq: Every day | ORAL | Status: DC

## 2021-08-10 MED ORDER — TRAZODONE HCL 50 MG PO TABS: 25.0000 mg | ORAL_TABLET | Freq: Every evening | ORAL | Status: DC | PRN

## 2021-08-10 MED ORDER — SODIUM CHLORIDE 0.9 % IV BOLUS
1000.0000 mL | Freq: Once | INTRAVENOUS | Status: AC
  Administered 2021-08-10: 1000 mL via INTRAVENOUS

## 2021-08-10 MED ORDER — VITAMIN D 25 MCG (1000 UNIT) PO TABS
1000.0000 [IU] | ORAL_TABLET | Freq: Every day | ORAL | Status: DC
  Administered 2021-08-11 – 2021-08-12 (×3): 1000 [IU] via ORAL
  Filled 2021-08-10 (×3): qty 1

## 2021-08-10 MED ORDER — ACETAMINOPHEN 650 MG RE SUPP: 650.0000 mg | Freq: Four times a day (QID) | RECTAL | Status: DC | PRN

## 2021-08-10 MED ORDER — MAGNESIUM HYDROXIDE 400 MG/5ML PO SUSP: 30.0000 mL | Freq: Every day | ORAL | Status: DC | PRN

## 2021-08-10 MED ORDER — METOPROLOL TARTRATE 25 MG PO TABS: 25.0000 mg | ORAL_TABLET | Freq: Two times a day (BID) | ORAL | Status: DC

## 2021-08-10 MED ORDER — HYDROCODONE-ACETAMINOPHEN 5-325 MG PO TABS: 1.0000 | ORAL_TABLET | Freq: Four times a day (QID) | ORAL | Status: DC | PRN

## 2021-08-10 MED ORDER — CEFTRIAXONE SODIUM 1 G IJ SOLR
1.0000 g | Freq: Once | INTRAMUSCULAR | Status: AC
  Administered 2021-08-10: 1 g via INTRAVENOUS
  Filled 2021-08-10: qty 10

## 2021-08-10 NOTE — Telephone Encounter (Signed)
Patient's wife called she states he pulled his stent out on Friday with no problems, however since Saturday he started shuffling his feet. I informed her that shuffling feet does not sound like a Urological issue and she may need to call his PCP about this. She also states he cannot hold his bladder to get to the bathroom. She said he took Oxybutynin and Tamsulosin at the same time and vomited so he has not taken any since. I told her to start with the Tamsulosin and see if this medication will help with the uncontrollable bladder symptoms and if he is still leaking he may be having bladder spasms and after a couple hours try the Oxybutynin. This way if either of these medications bother his stomach they would be able to find out which one it was. She states she will call PCP about shuffling and try the medications separately. She will call our office if she has any more questions.

## 2021-08-10 NOTE — H&P (Signed)
[place]   PATIENT NAME: Joseph Schmitt    MR#:  [id number]  DATE OF BIRTH:  [birth year]  DATE OF ADMISSION:  08/10/2021  PRIMARY CARE PHYSICIAN: Latanya Maudlin, NP   Patient is coming from: Home  REQUESTING/REFERRING PHYSICIAN: Rada Hay, MD  CHIEF COMPLAINT:   Chief Complaint  Patient presents with   Near Syncope    HISTORY OF PRESENT ILLNESS:  Kross Swallows is a 66 y.o. African-American male with medical history significant for coronary artery disease status post CABG, V. fib arrest, with inferior STEMI, history of TAVR diastolic dysfunction, hypertension and dyslipidemia, who presented to the emergency room with acute onset of syncope while he was sitting.  He has been feeling generalized fatigue over the last couple of days with diminished appetite.  He had a lithotripsy and ureteral stent about a week ago and shortly after that did not feel well and was having dysuria and urinary frequency and urgency.  He then was advised to pull his stent out which she did.  He continues to have occasional urinary frequency and urgency and mild dysuria.  He had a low-grade fever of 100.1.  No nausea or vomiting or abdominal pain.  He admitted to chills without reported measured fever.  No cough or wheezing or hemoptysis.  He was noted to have shuffling gait in the ER.  No paresthesias or focal muscle weakness.  He admitted to tremors in the left hand.  No chest pain or palpitations or dyspnea or cough or wheezing.  ED Course: Upon presentation to the emergency room, BP was 82/68 with respiratory rate of 30 and later BP was 123/68.  CMP revealed BUN of 27 and creatinine 1.65 compared to 25/1.04 on 02/05/2021..  High-sensitivity troponin I was 117 and later 99.  Lactic acid 1.2 and later 1.6.  CBC showed leukocytosis of 12.3 with leukocytosis and mild anemia.  UA was positive for UTI.  Urine and blood cultures were sent.  EKG as reviewed by me : EKG showed normal sinus  rhythm with rate of 92 with LVH with repolarization abnormality. Imaging: Portable chest x-ray showed no acute cardiopulmonary disease.    Chest CTA revealed clear lung fields with no acute pulmonary embolus.    CT renal stone showed limitation of the assessment of renal collecting systems and bladder due to presence of excreted contrast with obscure known stone disease with no gross hydronephrosis.  There is duplicated collecting system on the right.  No acute intra-abdominal or pelvic abnormality.  It showed aortobiiliac stent graft and right pelvic sidewall embolization coils with grossly stable  suspected 5 cm aneurysm of the right internal iliac artery which may be excluded by the iliac stent.  The patient was given IV Rocephin, aspirin and 1 L bolus of IV normal saline.  He will be admitted to a medical telemetry bed.  Past Medical History:  Diagnosis Date   2nd degree AV block    a.) s/p PPM placement 05/26/2020   Adrenal adenoma, left    a.) CT 05/01/2021: measured 31mm   Aortic atherosclerosis ([place])    Bilateral renal cysts 05/01/2021   Cardiac arrest with ventricular fibrillation ([place]) 09/23/2008   a.) in setting of acute inferior STEMI; 2 episodes requiring a single defibrillation each time prior to ROSC.   Chlamydia    Coronary artery disease    Diastolic dysfunction    a.)  TTE 06/03/2021: EF >55%; severe LA enlargement; bioprosthetic AoV well-seated with a  mean transvalvular gradient of 11 mmHg; G2DD.   Excessive daytime sleepiness    Heart murmur    History of kidney stones    History of transcatheter aortic valve replacement (TAVR) 05/14/2020   a.) 23 mm Edwards Sapien S3 Ultra bioprosthetic valve   Hypercholesteremia    Hypertension    Iliac artery aneurysm ([place]) 06/30/2020   a.) CTA 06/30/2020: RIGHT common iliac artery thrombsed and measured 3.8 cm, LEFT common iliac artery measure 4.6 cm, RIGHT internal iliac artery measure 4.2 cm; s/p endovascular stenting and  repair 08/19/2020.   Long term current use of anticoagulant    a.) apixaban   Long term current use of antithrombotics/antiplatelets    a.) DAPT (ASA + clopidogrel)   NSTEMI (non-ST elevated myocardial infarction) ([place]) 03/15/2016   a.)  LHC 03/16/2016: 100% pRCA, 100% pLAD, 100% RI, 85% dLCx-1, 100% dLCx-2; LIMA-LAD and SVG-OM 3 bypass grafts patent.  60% stenosis of the SVG-RCA, 60% stenosis of the SVG-D1, and total occlusion with significant thrombus noted within the SVG-OM1 bypass graft.   PAF (paroxysmal atrial fibrillation) ([facility])    a.) CHA2DS2-VASc = 3 (age, HTN, previous MI). b.) Rate/rhythm maintained on daily metoprolol; carhnically anticoagulated with apixaban + ASA + clopidogrel   Presence of permanent cardiac pacemaker 05/26/2020   a.) Medtronic device   S/P CABG x 5 09/23/2008   a.) LIMA-LAD, SVG-D1, SVG-OM1, SVG-OM3, SVG-PDA   Severe aortic stenosis    a.) s/p TAVR 05/14/2020   ST elevation myocardial infarction (STEMI) of inferior wall ([place]) 09/23/2008   a.) VF arrest x 2; ROSC with defibrillation x 1 each time. LHC --> 70% pRCA, 30% oLM, 30% LM, 99% mLCx, 50% dLCx, 90% OM1, 20% OM3, 30% LPL1, 20% pLAD, 30% mLAD-1, 80% mLAD-2, 90% D2; refer to CVTS. Underwent 5v CABG 09/23/2008.    PAST SURGICAL HISTORY:   Past Surgical History:  Procedure Laterality Date   APPENDECTOMY     CARDIAC CATHETERIZATION N/A 03/16/2016   Procedure: Coronary/ grafts  Angiography;  Surgeon: [name], MD;  Location: [facility];  Service: Cardiovascular;  Laterality: N/A;   CARDIAC CATHETERIZATION Left 07/26/2008   Procedure: CARDIAC CATHETERIZATION; Location: [facility]   COLONOSCOPY WITH PROPOFOL N/A 12/03/2019   Procedure: COLONOSCOPY WITH PROPOFOL;  Surgeon: [place], Benay Pike, MD;  Location: [facility];  Service: Gastroenterology;  Laterality: N/A;   CORONARY ARTERY BYPASS GRAFT N/A 09/23/2008   CORONARY STENT PLACEMENT  2021   CYSTOSCOPY/URETEROSCOPY/HOLMIUM LASER/STENT  PLACEMENT Left 08/03/2021   Procedure: CYSTOSCOPY/URETEROSCOPY/HOLMIUM LASER/STENT PLACEMENT-LEFT (POSSIBLE RIGHT);  Surgeon: Hollice Espy, MD;  Location: [facility];  Service: Urology;  Laterality: Left;   INSERT / REPLACE / REMOVE PACEMAKER  05/26/2020   Medtronic   KNEE ARTHROSCOPY Right    TRANSCATHETER AORTIC VALVE REPLACEMENT, TRANSAORTIC N/A 05/14/2020    SOCIAL HISTORY:   Social History   Tobacco Use   Smoking status: Never   Smokeless tobacco: Never  Substance Use Topics   Alcohol use: No    FAMILY HISTORY:   Family History  Problem Relation Age of Onset   Hypertension Other     DRUG ALLERGIES:  No Known Allergies  REVIEW OF SYSTEMS:   ROS As per history of present illness. All pertinent systems were reviewed above. Constitutional, HEENT, cardiovascular, respiratory, GI, GU, musculoskeletal, neuro, psychiatric, endocrine, integumentary and hematologic systems were reviewed and are otherwise negative/unremarkable except for positive findings mentioned above in the HPI.   MEDICATIONS AT HOME:   Prior to Admission medications  Medication Sig Start Date End Date Taking? Authorizing Provider  amLODipine (NORVASC) 10 MG tablet Take 10 mg by mouth daily. 07/03/21   [provider]  aspirin 81 MG chewable tablet Chew 1 tablet (81 mg total) by mouth daily. 03/17/16   Bettey Costa, MD  atorvastatin (LIPITOR) 40 MG tablet Take 1 tablet (40 mg total) by mouth daily at 6 PM. 03/17/16   Bettey Costa, MD  cholecalciferol (VITAMIN D3) 25 MCG (1000 UNIT) tablet Take 1,000 Units by mouth daily.    [provider]  clopidogrel (PLAVIX) 75 MG tablet Take 1 tablet (75 mg total) by mouth daily. Patient not taking: Reported on 07/22/2021 03/17/16   Bettey Costa, MD  cyanocobalamin 1000 MCG tablet Take 1,000 mcg by mouth daily.    [provider]  ELIQUIS 5 MG TABS tablet Take 5 mg by mouth 2 (two) times daily. 03/17/21   [provider]   HYDROcodone-acetaminophen (NORCO/VICODIN) 5-325 MG tablet Take 1-2 tablets by mouth every 6 (six) hours as needed for moderate pain. 08/03/21   Hollice Espy, MD  isosorbide mononitrate (IMDUR) 30 MG 24 hr tablet Take 1 tablet (30 mg total) by mouth daily. Patient not taking: Reported on 07/22/2021 03/17/16   Bettey Costa, MD  lisinopril (PRINIVIL,ZESTRIL) 40 MG tablet Take 1 tablet (40 mg total) by mouth daily. Patient not taking: Reported on 07/22/2021 03/17/16   Bettey Costa, MD  losartan (COZAAR) 100 MG tablet Take 100 mg by mouth daily.    [provider]  metoprolol tartrate (LOPRESSOR) 25 MG tablet Take 25 mg by mouth 2 (two) times daily. 07/01/21   [provider]  metoprolol tartrate (LOPRESSOR) 50 MG tablet Take 1 tablet (50 mg total) by mouth 2 (two) times daily. Patient not taking: Reported on 07/22/2021 03/17/16   Bettey Costa, MD  nitroGLYCERIN (NITROSTAT) 0.4 MG SL tablet Place 1 tablet (0.4 mg total) under the tongue every 5 (five) minutes as needed for chest pain. Patient not taking: Reported on 07/22/2021 03/17/16   Bettey Costa, MD  oxybutynin (DITROPAN) 5 MG tablet Take 1 tablet (5 mg total) by mouth every 8 (eight) hours as needed for bladder spasms. 08/03/21   Hollice Espy, MD  rosuvastatin (CRESTOR) 40 MG tablet Take 40 mg by mouth daily. 06/15/21   [provider]  tamsulosin (FLOMAX) 0.4 MG CAPS capsule Take 1 capsule (0.4 mg total) by mouth daily. 08/03/21   Hollice Espy, MD      VITAL SIGNS:  Blood pressure 119/65, pulse 65, temperature 100.1 F (37.8 C), temperature source Oral, resp. rate (!) 32, SpO2 100 %.  PHYSICAL EXAMINATION:  Physical Exam  GENERAL:  66 y.o.-year-old African-American male patient lying in the bed with no acute distress.  EYES: Pupils equal, round, reactive to light and accommodation. No scleral icterus. Extraocular muscles intact.  HEENT: Head atraumatic, normocephalic. Oropharynx and nasopharynx clear.  NECK:   Supple, no jugular venous distention. No thyroid enlargement, no tenderness.  LUNGS: Normal breath sounds bilaterally, no wheezing, rales,rhonchi or crepitation. No use of accessory muscles of respiration.  CARDIOVASCULAR: Regular rate and rhythm, S1, S2 normal. No murmurs, rubs, or gallops.  ABDOMEN: Soft, nondistended, nontender. Bowel sounds present. No organomegaly or mass.  EXTREMITIES: No pedal edema, cyanosis, or clubbing.  NEUROLOGIC: Cranial nerves II through XII are intact. Muscle strength 5/5 in all extremities. Sensation intact. Gait was checked earlier and he had intermittent shuffling gait per the ER physician. PSYCHIATRIC: The patient is alert and oriented x 3.  Normal affect and good eye contact. SKIN: No obvious rash, lesion, or ulcer.   LABORATORY PANEL:   CBC Recent Labs  Lab 08/10/21 1648  WBC 12.3*  HGB 12.1*  HCT 36.2*  PLT 191   ------------------------------------------------------------------------------------------------------------------  Chemistries  Recent Labs  Lab 08/10/21 1648  NA 136  K 3.5  CL 102  CO2 25  GLUCOSE 129*  BUN 27*  CREATININE 1.65*  CALCIUM 8.9  AST 36  ALT 35  ALKPHOS 76  BILITOT 1.1   ------------------------------------------------------------------------------------------------------------------  Cardiac Enzymes No results for input(s): TROPONINI in the last 168 hours. ------------------------------------------------------------------------------------------------------------------  RADIOLOGY:  CT Angio Chest PE W and/or Wo Contrast  Result Date: 08/10/2021 CLINICAL DATA:  Dizzy lightheaded EXAM: CT ANGIOGRAPHY CHEST WITH CONTRAST TECHNIQUE: Multidetector CT imaging of the chest was performed using the standard protocol during bolus administration of intravenous contrast. Multiplanar CT image reconstructions and MIPs were obtained to evaluate the vascular anatomy. RADIATION DOSE REDUCTION: This exam was performed  according to the departmental dose-optimization program which includes automated exposure control, adjustment of the mA and/or kV according to patient size and/or use of iterative reconstruction technique. CONTRAST:  29mL OMNIPAQUE IOHEXOL 350 MG/ML SOLN COMPARISON:  Chest x-ray 08/10/2021 FINDINGS: Cardiovascular: Slightly suboptimal opacification of the pulmonary arteries. No acute pulmonary embolus is seen. Post CABG changes. Aortic valve replacement. Coronary vascular calcifications. Partially visualized intracardiac pacing leads. Borderline cardiomegaly. No pericardial effusion Mediastinum/Nodes: Midline trachea. No thyroid mass. No suspicious lymph nodes. Esophagus within normal limits Lungs/Pleura: Lungs are clear. No pleural effusion or pneumothorax. Upper Abdomen: No acute abnormality. Musculoskeletal: Post sternotomy changes. No acute osseous abnormality Review of the MIP images confirms the above findings. IMPRESSION: 1. Negative for acute pulmonary embolus. 2. Clear lung fields Aortic Atherosclerosis (ICD10-I70.0). Electronically Signed   By: Donavan Foil M.D.   On: 08/10/2021 20:20   DG Chest Portable 1 View  Result Date: 08/10/2021 CLINICAL DATA:  Near syncope, short of breath EXAM: PORTABLE CHEST 1 VIEW COMPARISON:  03/15/2016 FINDINGS: Two frontal views of the chest demonstrate stable postsurgical changes from CABG. Dual lead pacer overlies right chest, proximal lead over right atrium and distal lead over right ventricle. Evidence of aortic valve replacement. Cardiac silhouette is mildly enlarged, likely projectional. No airspace disease, effusion, or pneumothorax. No acute bony abnormalities. IMPRESSION: 1. No acute intrathoracic process. Electronically Signed   By: Randa Ngo M.D.   On: 08/10/2021 18:50   CT Renal Stone Study  Result Date: 08/10/2021 CLINICAL DATA:  Flank pain hematuria EXAM: CT ABDOMEN AND PELVIS WITHOUT CONTRAST TECHNIQUE: Multidetector CT imaging of the abdomen and  pelvis was performed following the standard protocol without IV contrast. RADIATION DOSE REDUCTION: This exam was performed according to the departmental dose-optimization program which includes automated exposure control, adjustment of the mA and/or kV according to patient size and/or use of iterative reconstruction technique. COMPARISON:  CT 05/01/2021 FINDINGS: Lower chest: Lung bases demonstrate no acute consolidation or pleural effusion. Partially visualized intracardiac pacing leads and aortic valve prosthesis. Cardiomegaly. Hepatobiliary: No focal liver abnormality is seen. No gallstones, gallbladder wall thickening, or biliary dilatation. Pancreas: Unremarkable. No pancreatic ductal dilatation or surrounding inflammatory changes. Spleen: Normal in size without focal abnormality. Adrenals/Urinary Tract: Right adrenal gland is normal. Stable 15 mm left adrenal gland nodule, probable adenoma. Assessment of the renal collecting systems is limited due to excreted contrast within the collecting systems. Multiple low-density lesions most likely cysts. Duplicated collecting system on the right with fusion of the proximal duplicated ureters  to form a single common ureter. Bladder evaluation limited due to dense contrast. No hydronephrosis. Stomach/Bowel: The stomach is nonenlarged. No dilated small bowel. No acute bowel wall thickening. Vascular/Lymphatic: Advanced aortic atherosclerosis. Aorto bi-iliac stent, on the left graft material extends into the left proximal internal iliac artery. Grossly stable residual sac diameter at the left common iliac artery measuring up to 3.8 cm. Grossly stable 5 cm probable aneurysm in the region of right internal iliac artery, appears excluded by the stent. Embolization in the right posterior pelvic sidewall. No grossly enlarged lymph nodes. Reproductive: Prostate is enlarged. Other: Negative for pelvic effusion or free air. Musculoskeletal: Degenerative changes.  No acute osseous  abnormality IMPRESSION: 1. Assessment of the renal collecting systems and bladder is limited due to the presence of excreted contrast which obscures the known stone disease. There is no gross hydronephrosis. 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 2. No definite CT evidence for acute intra-abdominal or pelvic abnormality 3. Status post aortal bi-iliac stent graft. Right pelvic sidewall embolization coils with grossly stable suspected 5 cm aneurysm in the region of right internal iliac artery which may be excluded by the iliac stent. Nonemergent CTA follow-up previously suggested. Electronically Signed   By: Donavan Foil M.D.   On: 08/10/2021 21:29      IMPRESSION AND PLAN:  Principal Problem:   Sepsis due to gram-negative UTI ([place])  1.  Sepsis due to UTI.  Sepsis is manifested by tachypnea and leukocytosis. - The patient will be admitted to a cardiac telemetry bed. - We will continue IV Rocephin. - We will follow blood and urine cultures. - The patient will be hydrated with IV normal saline.  2.  Syncope with elevated troponin I. - We will follow serial troponins. - Elevated troponin could be related to demand ischemia. - We will order statics. - 2D echo will be obtained as well as carotid Doppler. - Cardiology consult will be obtained. -I notified Dr. Nehemiah Massed.  3.  Abnormal gait/shuffling gait with associated tremors. -Physical therapy evaluation will be obtained - Neurology consult to be obtained. - I notified Dr. Quinn Axe about patient  4.  Acute kidney injury likely prerenal. - He will be hydrated with IV normal saline. - We will follow BMP.  DVT prophylaxis: Lovenox.  Code Status: full code.  Family Communication:  The plan of care was discussed in details with the patient (and family). I answered all questions. The patient agreed to proceed with the above mentioned plan. Further management will depend upon hospital course. Disposition Plan: Back to previous home  environment Consults called: Neurology. All the records are reviewed and case discussed with ED provider.  Status is: Inpatient  At the time of the admission, it appears that the appropriate admission status for this patient is inpatient.  This is judged to be reasonable and necessary in order to provide the required intensity of service to ensure the patient's safety given the presenting symptoms, physical exam findings and initial radiographic and laboratory data in the context of comorbid conditions.  The patient requires inpatient status due to high intensity of service, high risk of further deterioration and high frequency of surveillance required.  I certify that at the time of admission, it is my clinical judgment that the patient will require inpatient hospital care extending more than 2 midnights.  Dispo: The patient is from: Home              Anticipated d/c is to: Home              Patient currently is not medically stable to d/c.              Difficult to place patient: No  Christel Mormon M.D on 08/10/2021 at 10:09 PM  [facility]   From 7 PM-7 AM, contact night-coverage [email or web address]  CC: Primary care physician; Latanya Maudlin, NP

## 2021-08-10 NOTE — ED Triage Notes (Signed)
Pt comes into the ED via EMS from home with c/o syncope, felt whoozy and sat down before he passed, having issues with loss of bowel and bladder since having cystoscopy for kidney stones 1/9  134/73 HR94 95%RA 98.9 temp

## 2021-08-10 NOTE — ED Provider Triage Note (Signed)
Emergency Medicine Provider Triage Evaluation Note  Joseph Schmitt , a 66 y.o. male  was evaluated in triage.  Pt complains of feeling dizzy and lightheaded after he took his blood pressure medication which has been recently prescribed.  Patient cannot remember the name of medication.  Patient had a cystoscopy/ureteroscopic he with stent placement by Dr. Erlene Quan on 08/03/21.  Review of Systems  Positive: Patient had dizziness and lightheadedness. Negative: No chest pain or abdominal pain.   Physical Exam  BP 115/66 (BP Location: Right Arm)    Pulse 90    Temp 100.1 F (37.8 C) (Oral)    Resp 17    SpO2 93%  Gen:   Awake, no distress   Resp:  Normal effort  MSK:   Moves extremities without difficulty  Other:    Medical Decision Making  Medically screening exam initiated at 4:49 PM.  Appropriate orders placed.  Joseph Schmitt was informed that the remainder of the evaluation will be completed by another provider, this initial triage assessment does not replace that evaluation, and the importance of remaining in the ED until their evaluation is complete.     Joseph Schmitt, [place] 08/10/21 1653

## 2021-08-10 NOTE — ED Provider Notes (Signed)
[facility] Provider Note    Event Date/Time   First MD Initiated Contact with Patient 08/10/21 1806     (approximate)   History   Near Syncope   HPI  Joseph Schmitt is a 66 y.o. male past medical history of second-degree AV block status post pacemaker placement, history of V. fib cardiac arrest in the setting of inferior STEMI, history of TAVR who presents after syncopal episode.  Patient's wife provides some of the history.  He had a cystoscopy and stenting with lithotripsy on Monday about 1 week ago.  Several days after he started having some fatigue and decreased appetite.  He personally removed the stent at the request of urology on Friday.  Afterwards was having urinary frequency and urgency.  Has had urinary incontinence as well.  Wife notes that he has appears to be shuffling when he is walking occasionally.  Has not had fevers chills nausea vomiting or abdominal pain.  Has not had chest pain or shortness of breath.  Today he was sitting in the chair when he suddenly slumped over and lost consciousness.  Patient said he felt lightheaded before.  Currently feels back to baseline.  Denies numbness weakness visual change or dizziness.  Patient was started on Flomax today at the request of the urology office.  He took the Flomax at the same time that he took his blood pressure medication.  Wife questions whether this caused the syncopal episode.    Past Medical History:  Diagnosis Date   2nd degree AV block    a.) s/p PPM placement 05/26/2020   Adrenal adenoma, left    a.) CT 05/01/2021: measured 59mm   Aortic atherosclerosis ([place])    Bilateral renal cysts 05/01/2021   Cardiac arrest with ventricular fibrillation ([place]) 09/23/2008   a.) in setting of acute inferior STEMI; 2 episodes requiring a single defibrillation each time prior to ROSC.   Chlamydia    Coronary artery disease    Diastolic dysfunction    a.)  TTE 06/03/2021: EF >55%; severe LA  enlargement; bioprosthetic AoV well-seated with a mean transvalvular gradient of 11 mmHg; G2DD.   Excessive daytime sleepiness    Heart murmur    History of kidney stones    History of transcatheter aortic valve replacement (TAVR) 05/14/2020   a.) 23 mm Edwards Sapien S3 Ultra bioprosthetic valve   Hypercholesteremia    Hypertension    Iliac artery aneurysm ([place]) 06/30/2020   a.) CTA 06/30/2020: RIGHT common iliac artery thrombsed and measured 3.8 cm, LEFT common iliac artery measure 4.6 cm, RIGHT internal iliac artery measure 4.2 cm; s/p endovascular stenting and repair 08/19/2020.   Long term current use of anticoagulant    a.) apixaban   Long term current use of antithrombotics/antiplatelets    a.) DAPT (ASA + clopidogrel)   NSTEMI (non-ST elevated myocardial infarction) ([place]) 03/15/2016   a.)  LHC 03/16/2016: 100% pRCA, 100% pLAD, 100% RI, 85% dLCx-1, 100% dLCx-2; LIMA-LAD and SVG-OM 3 bypass grafts patent.  60% stenosis of the SVG-RCA, 60% stenosis of the SVG-D1, and total occlusion with significant thrombus noted within the SVG-OM1 bypass graft.   PAF (paroxysmal atrial fibrillation) (HCC)    a.) CHA2DS2-VASc = 3 (age, HTN, previous MI). b.) Rate/rhythm maintained on daily metoprolol; carhnically anticoagulated with apixaban + ASA + clopidogrel   Presence of permanent cardiac pacemaker 05/26/2020   a.) Medtronic device   S/P CABG x 5 09/23/2008   a.) LIMA-LAD, SVG-D1, SVG-OM1, SVG-OM3, SVG-PDA  Severe aortic stenosis    a.) s/p TAVR 05/14/2020   ST elevation myocardial infarction (STEMI) of inferior wall ([place]) 09/23/2008   a.) VF arrest x 2; ROSC with defibrillation x 1 each time. LHC --> 70% pRCA, 30% oLM, 30% LM, 99% mLCx, 50% dLCx, 90% OM1, 20% OM3, 30% LPL1, 20% pLAD, 30% mLAD-1, 80% mLAD-2, 90% D2; refer to CVTS. Underwent 5v CABG 09/23/2008.    Patient Active Problem List   Diagnosis Date Noted   Sepsis due to gram-negative UTI ([place]) 08/10/2021   Syncope 03/17/2016    Elevated troponin 03/15/2016     Physical Exam  Triage Vital Signs: ED Triage Vitals  Enc Vitals Group     BP 08/10/21 1644 115/66     Pulse Rate 08/10/21 1644 90     Resp 08/10/21 1644 17     Temp 08/10/21 1644 100.1 F (37.8 C)     Temp Source 08/10/21 1644 Oral     SpO2 08/10/21 1644 93 %     Weight --      Height --      Head Circumference --      Peak Flow --      Pain Score 08/10/21 1658 0     Pain Loc --      Pain Edu? --      Excl. in [place]? --     Most recent vital signs: Vitals:   08/10/21 2130 08/10/21 2302  BP: 119/65 (!) 149/78  Pulse: 65 89  Resp: (!) 32 (!) 25  Temp:    SpO2: 100% 98%     General: Awake, no distress.  CV:  Good peripheral perfusion.  Resp:  Normal effort.  Abd:  No distention.  Soft and nontender Neuro:             Awake, Alert, Oriented x 3   Aox3, nml speech  PERRL, EOMI, face symmetric, nml tongue movement  5/5 strength in the BL upper and lower extremities  Sensation grossly intact in the BL upper and lower extremities  Finger-nose-finger intact BL Patient intermittently has a shuffle to his gait but then can take normal steps  Other:     ED Results / Procedures / Treatments  Labs (all labs ordered are listed, but only abnormal results are displayed) Labs Reviewed  CBC WITH DIFFERENTIAL/PLATELET - Abnormal; Notable for the following components:      Result Value   WBC 12.3 (*)    Hemoglobin 12.1 (*)    HCT 36.2 (*)    Neutro Abs 10.0 (*)    Monocytes Absolute 1.4 (*)    All other components within normal limits  COMPREHENSIVE METABOLIC PANEL - Abnormal; Notable for the following components:   Glucose, Bld 129 (*)    BUN 27 (*)    Creatinine, Ser 1.65 (*)    Albumin 3.3 (*)    GFR, Estimated 46 (*)    All other components within normal limits  URINALYSIS, COMPLETE (UACMP) WITH MICROSCOPIC - Abnormal; Notable for the following components:   Color, Urine AMBER (*)    APPearance TURBID (*)    Hgb urine dipstick  LARGE (*)    Protein, ur 100 (*)    Leukocytes,Ua LARGE (*)    RBC / HPF >50 (*)    WBC, UA >50 (*)    Bacteria, UA RARE (*)    Non Squamous Epithelial PRESENT (*)    All other components within normal limits  TROPONIN I (HIGH SENSITIVITY) - Abnormal;  Notable for the following components:   Troponin I (High Sensitivity) 117 (*)    All other components within normal limits  TROPONIN I (HIGH SENSITIVITY) - Abnormal; Notable for the following components:   Troponin I (High Sensitivity) 99 (*)    All other components within normal limits  RESP PANEL BY RT-PCR (FLU A&B, COVID) ARPGX2  CULTURE, BLOOD (ROUTINE X 2)  CULTURE, BLOOD (ROUTINE X 2)  URINE CULTURE  LACTIC ACID, PLASMA  LACTIC ACID, PLASMA  PROCALCITONIN  HIV ANTIBODY (ROUTINE TESTING W REFLEX)  PROTIME-INR  CORTISOL-AM, BLOOD  PROCALCITONIN  BASIC METABOLIC PANEL  CBC     EKG  EKG interpreted by myself, normal axis, LVH with repolarization abnormality, similar to prior   RADIOLOGY Reviewed the CT angio which is negative for PE, agree with radiology report   I reviewed the CXR which does not show any acute cardiopulmonary process; agree with radiology report    PROCEDURES:  Critical Care performed: No  .1-3 Lead EKG Interpretation Performed by: Rada Hay, MD Authorized by: Rada Hay, MD     Interpretation: normal     ECG rate assessment: normal     Ectopy: none     Conduction: normal    The patient is on the cardiac monitor to evaluate for evidence of arrhythmia and/or significant heart rate changes.   MEDICATIONS ORDERED IN ED: Medications  aspirin EC tablet 325 mg (325 mg Oral Given 08/10/21 2208)  aspirin chewable tablet 81 mg (has no administration in time range)  HYDROcodone-acetaminophen (NORCO/VICODIN) 5-325 MG per tablet 1-2 tablet (has no administration in time range)  amLODipine (NORVASC) tablet 10 mg (has no administration in time range)  isosorbide mononitrate (IMDUR) 24  hr tablet 30 mg (has no administration in time range)  losartan (COZAAR) tablet 100 mg (has no administration in time range)  metoprolol tartrate (LOPRESSOR) tablet 50 mg (has no administration in time range)  nitroGLYCERIN (NITROSTAT) SL tablet 0.4 mg (has no administration in time range)  rosuvastatin (CRESTOR) tablet 40 mg (has no administration in time range)  oxybutynin (DITROPAN) tablet 5 mg (has no administration in time range)  tamsulosin (FLOMAX) capsule 0.4 mg (has no administration in time range)  vitamin B-12 (CYANOCOBALAMIN) tablet 1,000 mcg (has no administration in time range)  apixaban (ELIQUIS) tablet 5 mg (has no administration in time range)  cholecalciferol (VITAMIN D3) tablet 1,000 Units (has no administration in time range)  cefTRIAXone (ROCEPHIN) 2 g in sodium chloride 0.9 % 100 mL IVPB (has no administration in time range)  acetaminophen (TYLENOL) tablet 650 mg (has no administration in time range)    Or  acetaminophen (TYLENOL) suppository 650 mg (has no administration in time range)  traZODone (DESYREL) tablet 25 mg (has no administration in time range)  magnesium hydroxide (MILK OF MAGNESIA) suspension 30 mL (has no administration in time range)  ondansetron (ZOFRAN) tablet 4 mg (has no administration in time range)    Or  ondansetron (ZOFRAN) injection 4 mg (has no administration in time range)  0.9 % NaCl with KCl 20 mEq/ L  infusion (has no administration in time range)  sodium chloride 0.9 % bolus 1,000 mL (0 mLs Intravenous Stopped 08/10/21 2106)  iohexol (OMNIPAQUE) 350 MG/ML injection 75 mL (75 mLs Intravenous Contrast Given 08/10/21 1949)  cefTRIAXone (ROCEPHIN) 1 g in sodium chloride 0.9 % 100 mL IVPB (0 g Intravenous Stopped 08/10/21 2140)     IMPRESSION / MDM / ASSESSMENT AND PLAN / ED COURSE  I  reviewed the triage vital signs and the nursing notes.                              Differential diagnosis includes, but is not limited to, ACS, pulmonary  embolism, vasovagal, cardiac arrhythmia, medication side effect, infection  Patient is a 66 year old male presents with a syncopal episode.  Patient with recent cystoscopy and lithotripsy and stent placement about a week ago and developing decreased appetite fatigue and urinary symptoms.  Vital signs are notable for low-grade temp of 100.1 and mild tachypnea.  Patient overall appears well and has really no acute complaints today.  His wife notes that he has had a somewhat shuffling gait and had urinary incontinence and urgency which is new for him.  Today he took the tamsulosin and his blood pressure medication at the same time and then was noted to have a syncopal episode while he was sitting down.  There was no prodrome.  His EKG has no concerning signs, he has LVH with repolarization abnormality but no other ischemic changes or abnormal intervals.  Troponins are notably elevated.  With his history of cardiac disease and syncope with elevated troponins I do feel that he warrants observation.  His urine is positive which could be driving his underlying illness.  We will cover with Rocephin.  Given the recent instrumentation and stones I did obtain a CT renal study to ensure that he was not acutely obstructed and does not appear to be so.  Also with the recent procedure elevated troponin syncope obtain a CT angio which is negative for PE.  Discussed with the hospitalist for admission.     FINAL CLINICAL IMPRESSION(S) / ED DIAGNOSES   Final diagnoses:  Syncope, unspecified syncope type  Urinary tract infection without hematuria, site unspecified     Rx / DC Orders   ED Discharge Orders     None        Note:  This document was prepared using Dragon voice recognition software and may include unintentional dictation errors.   Rada Hay, MD 08/10/21 2329

## 2021-08-11 ENCOUNTER — Inpatient Hospital Stay: Payer: 59

## 2021-08-11 ENCOUNTER — Inpatient Hospital Stay
Admit: 2021-08-11 | Discharge: 2021-08-11 | Disposition: A | Discharge status: Home / Self Care | Payer: 59 | Attending: Family Medicine | Admitting: Family Medicine

## 2021-08-11 DIAGNOSIS — R55 Syncope and collapse: Secondary | ICD-10-CM

## 2021-08-11 DIAGNOSIS — N390 Urinary tract infection, site not specified: Secondary | ICD-10-CM

## 2021-08-11 DIAGNOSIS — A4150 Gram-negative sepsis, unspecified: Secondary | ICD-10-CM

## 2021-08-11 DIAGNOSIS — N179 Acute kidney failure, unspecified: Secondary | ICD-10-CM

## 2021-08-11 LAB — BASIC METABOLIC PANEL
Anion gap: 5 (ref 5–15)
BUN: 25 mg/dL — ABNORMAL HIGH (ref 8–23)
CO2: 26 mmol/L (ref 22–32)
Calcium: 8.5 mg/dL — ABNORMAL LOW (ref 8.9–10.3)
Chloride: 102 mmol/L (ref 98–111)
Creatinine, Ser: 1.29 mg/dL — ABNORMAL HIGH (ref 0.61–1.24)
GFR, Estimated: >60 mL/min (ref >60)
Glucose, Bld: 137 mg/dL — ABNORMAL HIGH (ref 70–99)
Potassium: 3.3 mmol/L — ABNORMAL LOW (ref 3.5–5.1)
Sodium: 133 mmol/L — ABNORMAL LOW (ref 135–145)

## 2021-08-11 LAB — CBC
HCT: 33.3 % — ABNORMAL LOW (ref 39.0–52.0)
Hemoglobin: 11 g/dL — ABNORMAL LOW (ref 13.0–17.0)
MCH: 28.4 pg (ref 26.0–34.0)
MCHC: 33 g/dL (ref 30.0–36.0)
MCV: 86 fL (ref 80.0–100.0)
Platelets: 170 10*3/uL (ref 150–400)
RBC: 3.87 MIL/uL — ABNORMAL LOW (ref 4.22–5.81)
RDW: 14.8 % (ref 11.5–15.5)
WBC: 8.2 10*3/uL (ref 4.0–10.5)
nRBC: 0 % (ref 0.0–0.2)

## 2021-08-11 LAB — PROTIME-INR
INR: 1.4 — ABNORMAL HIGH (ref 0.8–1.2)
Prothrombin Time: 17.2 seconds — ABNORMAL HIGH (ref 11.4–15.2)

## 2021-08-11 LAB — CORTISOL-AM, BLOOD: Cortisol - AM: 18.9 ug/dL (ref 6.7–22.6)

## 2021-08-11 LAB — PROCALCITONIN: Procalcitonin: 0.79 ng/mL

## 2021-08-11 LAB — HIV ANTIBODY (ROUTINE TESTING W REFLEX): HIV Screen 4th Generation wRfx: NONREACTIVE

## 2021-08-11 MED ORDER — PERFLUTREN LIPID MICROSPHERE
1.0000 mL | INTRAVENOUS | Status: AC | PRN
  Administered 2021-08-11: 1 mL via INTRAVENOUS
  Filled 2021-08-11: qty 10

## 2021-08-11 MED ORDER — POTASSIUM CHLORIDE CRYS ER 20 MEQ PO TBCR
40.0000 meq | EXTENDED_RELEASE_TABLET | Freq: Once | ORAL | Status: AC
  Administered 2021-08-11: 40 meq via ORAL
  Filled 2021-08-11: qty 2

## 2021-08-11 NOTE — ED Notes (Signed)
Patient resting in stretcher at this time. Family member at bedside.

## 2021-08-11 NOTE — ED Notes (Signed)
Ultrasound in room at this time.  

## 2021-08-11 NOTE — Evaluation (Signed)
Physical Therapy Evaluation Patient Details Name: Joseph Schmitt MRN: [id number] DOB: 01/02/1956 Today's Date: 08/11/2021  History of Present Illness  Pt is a 66 y.o. male with past medical history of second-degree AV block status post pacemaker placement, history of V. fib cardiac arrest in the setting of inferior STEMI, history of TAVR, and with recent cystoscopy and stenting with lithotripsy. Pt presented to the ED after syncopal episode. MD assessment includes elevated troponin and UTI.   Clinical Impression  Pt was pleasant and motivated to participate during the session and put forth good effort throughout. Pt required no physical assistance during the session and reported no adverse symptoms.  Pt's SpO2 and HR were both WNL on room air throughout.  Pt required SBA with AMB and demonstrated good cadence and stability with a RW.  During gait training without an AD the pt's cadence slowed and he presented with minor drifting left/right. Both pt and spouse declined a RW. Pt will benefit from OPPT upon discharge to safely address deficits listed in patient problem list for decreased caregiver assistance and eventual return to PLOF.         Recommendations for follow up therapy are one component of a multi-disciplinary discharge planning process, led by the attending physician.  Recommendations may be updated based on patient status, additional functional criteria and insurance authorization.  Follow Up Recommendations Outpatient PT    Assistance Recommended at Discharge Intermittent Supervision/Assistance  Patient can return home with the following  A little help with walking and/or transfers;Help with stairs or ramp for entrance    Equipment Recommendations None recommended by PT  Recommendations for Other Services       Functional Status Assessment Patient has had a recent decline in their functional status and demonstrates the ability to make significant improvements in function  in a reasonable and predictable amount of time.     Precautions / Restrictions Precautions Precautions: Fall Restrictions Weight Bearing Restrictions: No      Mobility  Bed Mobility Overal bed mobility: Independent             General bed mobility comments: Good speed and effort    Transfers Overall transfer level: Independent Equipment used: None               General transfer comment: Good eccentric and concentric control and stability    Ambulation/Gait Ambulation/Gait assistance: Supervision Gait Distance (Feet): 300 Feet x 1 with RW, 100 Feet x 1 without an AD Assistive device: Rolling walker (2 wheels), None Gait Pattern/deviations: Step-through pattern, Decreased step length - right, Decreased step length - left, Drifts right/left Gait velocity: decreased     General Gait Details: Min decreased cadence with pt able to amb 300' with a RW and then 100' without an AD.  Minor drifting left/right with reduced cadence without an AD  Stairs            Wheelchair Mobility    Modified Rankin (Stroke Patients Only)       Balance Overall balance assessment: Needs assistance   Sitting balance-Leahy Scale: Normal     Standing balance support: No upper extremity supported Standing balance-Leahy Scale: Fair Standing balance comment: Min drifting L/R during amb without UE support                             Pertinent Vitals/Pain Pain Assessment Pain Assessment: No/denies pain    Home Living Family/patient expects to be discharged to::  Private residence Living Arrangements: Spouse/significant other Available Help at Discharge: Family;Available 24 hours/day Type of Home: House Home Access: Stairs to enter Entrance Stairs-Rails: None Entrance Stairs-Number of Steps: 2   Home Layout: One level Home Equipment: None      Prior Function Prior Level of Function : Independent/Modified Independent             Mobility Comments:  Ind Amb community distances without an AD, no fall history ADLs Comments: Ind with ADLs     Hand Dominance        Extremity/Trunk Assessment   Upper Extremity Assessment Upper Extremity Assessment: Overall WFL for tasks assessed    Lower Extremity Assessment Lower Extremity Assessment: Overall WFL for tasks assessed       Communication   Communication: No difficulties  Cognition Arousal/Alertness: Awake/alert Behavior During Therapy: WFL for tasks assessed/performed Overall Cognitive Status: Within Functional Limits for tasks assessed                                          General Comments      Exercises     Assessment/Plan    PT Assessment Patient needs continued PT services  PT Problem List Decreased balance       PT Treatment Interventions Gait training;Stair training;Therapeutic activities;Therapeutic exercise;Balance training;Patient/family education    PT Goals (Current goals can be found in the Care Plan section)  Acute Rehab PT Goals Patient Stated Goal: "To walk normal" PT Goal Formulation: With patient Time For Goal Achievement: 08/24/21 Potential to Achieve Goals: Good    Frequency Min 2X/week     Co-evaluation               AM-PAC PT "6 Clicks" Mobility  Outcome Measure Help needed turning from your back to your side while in a flat bed without using bedrails?: None Help needed moving from lying on your back to sitting on the side of a flat bed without using bedrails?: None Help needed moving to and from a bed to a chair (including a wheelchair)?: None Help needed standing up from a chair using your arms (e.g., wheelchair or bedside chair)?: None Help needed to walk in hospital room?: A Little Help needed climbing 3-5 steps with a railing? : A Little 6 Click Score: 22    End of Session Equipment Utilized During Treatment: Gait belt Activity Tolerance: Patient tolerated treatment well Patient left: in bed;with  family/visitor present;with call bell/phone within reach Nurse Communication: Mobility status PT Visit Diagnosis: Unsteadiness on feet (R26.81);Difficulty in walking, not elsewhere classified (R26.2)    Time: 1026-1100 PT Time Calculation (min) (ACUTE ONLY): 34 min   Charges:   PT Evaluation $PT Eval Moderate Complexity: 1 Mod PT Treatments $Gait Training: 8-22 mins        D. Scott Farooq Petrovich PT, DPT 08/11/21, 1:33 PM

## 2021-08-11 NOTE — ED Notes (Signed)
Patient to CT scan at this time

## 2021-08-11 NOTE — ED Notes (Signed)
Patient given sprite as requested.

## 2021-08-11 NOTE — ED Notes (Signed)
Patient denies pain and is resting comfortably.  

## 2021-08-11 NOTE — Consult Note (Addendum)
NEUROLOGY CONSULTATION NOTE   Date of service: August 11, 2021 Patient Name: Joseph Schmitt MRN:  [id number] DOB:  1956/02/02 Reason for consult: tremor and shuffling gait Requesting physician: Dr. Eppie Gibson  _ _ _   _ __   _ __ _ _  __ __   _ __   __ _  History of Present Illness   This is a 66 year old gentleman with a past medical history of second-degree AV block status post pacemaker implant, history of V. fib cardiac arrest in the setting of inferior STEMI, history of TAVR who presents after syncopal episode.  Neurology is consulted due to concern for tremor and shuffling gait.  Patient had cystoscopy and stenting with lithotripsy about 1 week ago several days after that he started having to fatigue and decreased appetite.  He personally remove the stent at the request of urology on Friday.  Afterwards he was having some urinary urgency and frequency as well as incontinence.  Wife noted at that time that he appeared to be intermittently shuffling his gait.  No fevers, chills, nausea, vomiting, abdominal pain, chest pain, shortness of breath.  Yesterday he felt lightheaded and suddenly slumped over and lost consciousness for couple of seconds and was back to baseline.  He has no focal neurologic deficits.  He has a very slight action tremor and an action of finger-nose-finger bilaterally and no shuffling gait or other signs of parkinsonism on exam.  No acute findings on CT head.    ROS   Per HPI: all other systems reviewed and are negative  Past History   I have reviewed the following:  Past Medical History:  Diagnosis Date   2nd degree AV block    a.) s/p PPM placement 05/26/2020   Adrenal adenoma, left    a.) CT 05/01/2021: measured 31mm   Aortic atherosclerosis ([place])    Bilateral renal cysts 05/01/2021   Cardiac arrest with ventricular fibrillation ([place]) 09/23/2008   a.) in setting of acute inferior STEMI; 2 episodes requiring a single defibrillation each time  prior to ROSC.   Chlamydia    Coronary artery disease    Diastolic dysfunction    a.)  TTE 06/03/2021: EF >55%; severe LA enlargement; bioprosthetic AoV well-seated with a mean transvalvular gradient of 11 mmHg; G2DD.   Excessive daytime sleepiness    Heart murmur    History of kidney stones    History of transcatheter aortic valve replacement (TAVR) 05/14/2020   a.) 23 mm Edwards Sapien S3 Ultra bioprosthetic valve   Hypercholesteremia    Hypertension    Iliac artery aneurysm ([place]) 06/30/2020   a.) CTA 06/30/2020: RIGHT common iliac artery thrombsed and measured 3.8 cm, LEFT common iliac artery measure 4.6 cm, RIGHT internal iliac artery measure 4.2 cm; s/p endovascular stenting and repair 08/19/2020.   Long term current use of anticoagulant    a.) apixaban   Long term current use of antithrombotics/antiplatelets    a.) DAPT (ASA + clopidogrel)   NSTEMI (non-ST elevated myocardial infarction) ([place]) 03/15/2016   a.)  LHC 03/16/2016: 100% pRCA, 100% pLAD, 100% RI, 85% dLCx-1, 100% dLCx-2; LIMA-LAD and SVG-OM 3 bypass grafts patent.  60% stenosis of the SVG-RCA, 60% stenosis of the SVG-D1, and total occlusion with significant thrombus noted within the SVG-OM1 bypass graft.   PAF (paroxysmal atrial fibrillation) ([facility])    a.) CHA2DS2-VASc = 3 (age, HTN, previous MI). b.) Rate/rhythm maintained on daily metoprolol; carhnically anticoagulated with apixaban + ASA + clopidogrel   Presence  of permanent cardiac pacemaker 05/26/2020   a.) Medtronic device   S/P CABG x 5 09/23/2008   a.) LIMA-LAD, SVG-D1, SVG-OM1, SVG-OM3, SVG-PDA   Severe aortic stenosis    a.) s/p TAVR 05/14/2020   ST elevation myocardial infarction (STEMI) of inferior wall ([place]) 09/23/2008   a.) VF arrest x 2; ROSC with defibrillation x 1 each time. LHC --> 70% pRCA, 30% oLM, 30% LM, 99% mLCx, 50% dLCx, 90% OM1, 20% OM3, 30% LPL1, 20% pLAD, 30% mLAD-1, 80% mLAD-2, 90% D2; refer to CVTS. Underwent 5v CABG 09/23/2008.   Past  Surgical History:  Procedure Laterality Date   APPENDECTOMY     CARDIAC CATHETERIZATION N/A 03/16/2016   Procedure: Coronary/ grafts  Angiography;  Surgeon: [name], MD;  Location: [facility];  Service: Cardiovascular;  Laterality: N/A;   CARDIAC CATHETERIZATION Left 07/26/2008   Procedure: CARDIAC CATHETERIZATION; Location: [facility]   COLONOSCOPY WITH PROPOFOL N/A 12/03/2019   Procedure: COLONOSCOPY WITH PROPOFOL;  Surgeon: Toledo, Benay Pike, MD;  Location: [facility];  Service: Gastroenterology;  Laterality: N/A;   CORONARY ARTERY BYPASS GRAFT N/A 09/23/2008   CORONARY STENT PLACEMENT  2021   CYSTOSCOPY/URETEROSCOPY/HOLMIUM LASER/STENT PLACEMENT Left 08/03/2021   Procedure: CYSTOSCOPY/URETEROSCOPY/HOLMIUM LASER/STENT PLACEMENT-LEFT (POSSIBLE RIGHT);  Surgeon: Hollice Espy, MD;  Location: [facility];  Service: Urology;  Laterality: Left;   INSERT / REPLACE / REMOVE PACEMAKER  05/26/2020   Medtronic   KNEE ARTHROSCOPY Right    TRANSCATHETER AORTIC VALVE REPLACEMENT, TRANSAORTIC N/A 05/14/2020   Family History  Problem Relation Age of Onset   Hypertension Other    Social History   Socioeconomic History   Marital status: Married    Spouse name: Not on file   Number of children: Not on file   Years of education: Not on file   Highest education level: Not on file  Occupational History   Not on file  Tobacco Use   Smoking status: Never   Smokeless tobacco: Never  Vaping Use   Vaping Use: Never used  Substance and Sexual Activity   Alcohol use: No   Drug use: Never   Sexual activity: Not on file  Other Topics Concern   Not on file  Social History Narrative   Not on file   Social Determinants of Health   Financial Resource Strain: Not on file  Food Insecurity: Not on file  Transportation Needs: Not on file  Physical Activity: Not on file  Stress: Not on file  Social Connections: Not on file   No Known Allergies  Medications   (Not in a hospital  admission)     Current Facility-Administered Medications:    0.9 % NaCl with KCl 20 mEq/ L  infusion, , Intravenous, Continuous, Mansy, Jan A, MD, Last Rate: 100 mL/hr at 08/11/21 0640, New Bag at 08/11/21 0640   acetaminophen (TYLENOL) tablet 650 mg, 650 mg, Oral, Q6H PRN **OR** acetaminophen (TYLENOL) suppository 650 mg, 650 mg, Rectal, Q6H PRN, Mansy, Jan A, MD   amLODipine (NORVASC) tablet 10 mg, 10 mg, Oral, Daily, Mansy, Jan A, MD, 10 mg at 08/11/21 1022   apixaban (ELIQUIS) tablet 5 mg, 5 mg, Oral, BID, Mansy, Jan A, MD, 5 mg at 08/11/21 1021   aspirin EC tablet 325 mg, 325 mg, Oral, Daily, Rada Hay, MD, 325 mg at 08/11/21 1021   cefTRIAXone (ROCEPHIN) 2 g in sodium chloride 0.9 % 100 mL IVPB, 2 g, Intravenous, Q24H, Mansy, Arvella Merles, MD, Stopped at 08/11/21 0631   cholecalciferol (  VITAMIN D3) tablet 1,000 Units, 1,000 Units, Oral, Daily, Mansy, Jan A, MD, 1,000 Units at 08/11/21 1021   HYDROcodone-acetaminophen (NORCO/VICODIN) 5-325 MG per tablet 1-2 tablet, 1-2 tablet, Oral, Q6H PRN, Mansy, Jan A, MD   losartan (COZAAR) tablet 100 mg, 100 mg, Oral, Daily, Mansy, Jan A, MD, 100 mg at 08/11/21 1022   magnesium hydroxide (MILK OF MAGNESIA) suspension 30 mL, 30 mL, Oral, Daily PRN, Mansy, Jan A, MD   metoprolol tartrate (LOPRESSOR) tablet 50 mg, 50 mg, Oral, BID, Mansy, Jan A, MD, 50 mg at 08/11/21 1021   nitroGLYCERIN (NITROSTAT) SL tablet 0.4 mg, 0.4 mg, Sublingual, Q5 min PRN, Mansy, Jan A, MD   ondansetron (ZOFRAN) tablet 4 mg, 4 mg, Oral, Q6H PRN **OR** ondansetron (ZOFRAN) injection 4 mg, 4 mg, Intravenous, Q6H PRN, Mansy, Jan A, MD   oxybutynin (DITROPAN) tablet 5 mg, 5 mg, Oral, Q8H PRN, Mansy, Jan A, MD   rosuvastatin (CRESTOR) tablet 40 mg, 40 mg, Oral, Daily, Mansy, Jan A, MD, 40 mg at 08/11/21 1022   tamsulosin (FLOMAX) capsule 0.4 mg, 0.4 mg, Oral, Daily, Mansy, Jan A, MD, 0.4 mg at 08/11/21 1022   traZODone (DESYREL) tablet 25 mg, 25 mg, Oral, QHS PRN, Mansy, Jan A, MD    vitamin B-12 (CYANOCOBALAMIN) tablet 1,000 mcg, 1,000 mcg, Oral, Daily, Mansy, Jan A, MD, 1,000 mcg at 08/11/21 1021  Current Outpatient Medications:    amLODipine (NORVASC) 10 MG tablet, Take 10 mg by mouth daily., Disp: , Rfl:    aspirin 81 MG chewable tablet, Chew 1 tablet (81 mg total) by mouth daily., Disp: 120 tablet, Rfl: 0   cholecalciferol (VITAMIN D3) 25 MCG (1000 UNIT) tablet, Take 1,000 Units by mouth daily., Disp: , Rfl:    cyanocobalamin 1000 MCG tablet, Take 1,000 mcg by mouth daily., Disp: , Rfl:    ELIQUIS 5 MG TABS tablet, Take 5 mg by mouth 2 (two) times daily., Disp: , Rfl:    HYDROcodone-acetaminophen (NORCO/VICODIN) 5-325 MG tablet, Take 1-2 tablets by mouth every 6 (six) hours as needed for moderate pain., Disp: 10 tablet, Rfl: 0   losartan (COZAAR) 100 MG tablet, Take 100 mg by mouth daily., Disp: , Rfl:    metoprolol tartrate (LOPRESSOR) 25 MG tablet, Take 25 mg by mouth 2 (two) times daily., Disp: , Rfl:    oxybutynin (DITROPAN) 5 MG tablet, Take 1 tablet (5 mg total) by mouth every 8 (eight) hours as needed for bladder spasms., Disp: 30 tablet, Rfl: 0   rosuvastatin (CRESTOR) 40 MG tablet, Take 40 mg by mouth daily., Disp: , Rfl:    tamsulosin (FLOMAX) 0.4 MG CAPS capsule, Take 1 capsule (0.4 mg total) by mouth daily., Disp: 30 capsule, Rfl: 0  Facility-Administered Medications Ordered in Other Encounters:    perflutren lipid microspheres (DEFINITY) IV suspension, 1-10 mL, Intravenous, PRN, Mansy, Jan A, MD, 1 mL at 08/11/21 1429  Vitals   Vitals:   08/11/21 1015 08/11/21 1021 08/11/21 1151 08/11/21 1500  BP: (!) 146/72 (!) 146/72 (!) 140/99 121/64  Pulse: 83 83 75 73  Resp: (!) 24  18 18   Temp:      TempSrc:      SpO2: 97%  96% 96%     There is no height or weight on file to calculate BMI.  Physical Exam   Physical Exam Gen: A&O x4, NAD HEENT: Atraumatic, normocephalic;mucous membranes moist; oropharynx clear, tongue without atrophy or  fasciculations. Neck: Supple, trachea midline. Resp: CTAB, no w/r/r CV: RRR,  no m/g/r; nml S1 and S2. 2+ symmetric peripheral pulses. Abd: soft/NT/ND; nabs x 4 quad Extrem: Nml bulk; no cyanosis, clubbing, or edema.  Neuro: *MS: A&O x4. Follows multi-step commands.  *Speech: fluid, nondysarthric, able to name and repeat *CN:    I: Deferred   II,III: PERRLA, VFF by confrontation, optic discs unable to be visualized 2/2 pupillary constriction   III,IV,VI: EOMI w/o nystagmus, no ptosis   V: Sensation intact from V1 to V3 to LT   VII: Eyelid closure was full.  Smile symmetric.   VIII: Hearing intact to voice   IX,X: Voice normal, palate elevates symmetrically    XI: SCM/trap 5/5 bilat   XII: Tongue protrudes midline, no atrophy or fasciculations   *Motor:   Normal bulk.  No tremor, rigidity or bradykinesia. No pronator drift.    Strength: Dlt Bic Tri WrE WrF FgS Gr HF KnF KnE PlF DoF    Left 5 5 5 5 5 5 5 5 5 5 5 5     Right 5 5 5 5 5 5 5 5 5 5 5 5     *Sensory: Intact to light touch, pinprick, temperature vibration throughout. Symmetric. Propioception intact bilat.  No double-simultaneous extinction.  *Coordination:  Finger-to-nose, heel-to-shin, rapid alternating motions were intact. V mild end-action tremor on FNF bilat.  *Reflexes:  2+ and symmetric throughout without clonus; toes down-going bilat *Gait: normal base, normal stride, normal turn. Negative Romberg.    Labs   CBC:  Recent Labs  Lab 08/10/21 1648 08/11/21 1015  WBC 12.3* 8.2  NEUTROABS 10.0*  --   HGB 12.1* 11.0*  HCT 36.2* 33.3*  MCV 85.8 86.0  PLT 191 270    Basic Metabolic Panel:  Lab Results  Component Value Date   NA 133 (L) 08/11/2021   K 3.3 (L) 08/11/2021   CO2 26 08/11/2021   GLUCOSE 137 (H) 08/11/2021   BUN 25 (H) 08/11/2021   CREATININE 1.29 (H) 08/11/2021   CALCIUM 8.5 (L) 08/11/2021   GFRNONAA >60 08/11/2021   GFRAA >60 03/17/2016   Lipid Panel:  Lab Results  Component Value Date    LDLCALC 144 (H) 03/15/2016   HgbA1c:  Lab Results  Component Value Date   HGBA1C 5.7 03/15/2016   Urine Drug Screen: No results found for: LABOPIA, COCAINSCRNUR, LABBENZ, AMPHETMU, THCU, LABBARB  Alcohol Level No results found for: ETH   Impression   This is a 66 year old gentleman with a past medical history of second-degree AV block status post pacemaker implant, history of V. fib cardiac arrest in the setting of inferior STEMI, history of TAVR who presents after syncopal episode.  Neurology is consulted due to concern for tremor and shuffling gait. On my examination he only has a v mild intention tremor on end action on FNF bilat. he has no postural tremor, resting tremor, truncal instability, cogwheeling rigidity, impairment of rapid alternating movements, or shuffling gait.  His gait is normal.  There is no evidence of parkinsonism on his exam today.    Recommendations   - No further inpatient neurologic work-up indicated.  If he develops further neurologic symptoms in the future he may follow-up with his PCP who can place a referral to outpatient neurology if indicated. - Continued workup of syncope per primary team - favor cardiac, orthostatic, or vasovagal etiology ______________________________________________________________________   Thank you for the opportunity to take part in the care of this patient. If you have any further questions, please contact the neurology consultation attending.  Signed,  Su Monks, MD [facility] [phone]  If 7pm- 7am, please page neurology on call as listed in [place].

## 2021-08-11 NOTE — ED Notes (Signed)
Patient given ginger ale. Lunch tray at bedside.

## 2021-08-11 NOTE — Progress Notes (Signed)
PROGRESS NOTE    HPI was taken from Dr. [name]: Joseph Schmitt is a 66 y.o. African-American male with medical history significant for coronary artery disease status post CABG, V. fib arrest, with inferior STEMI, history of TAVR diastolic dysfunction, hypertension and dyslipidemia, who presented to the emergency room with acute onset of syncope while he was sitting.  He has been feeling generalized fatigue over the last couple of days with diminished appetite.  He had a lithotripsy and ureteral stent about a week ago and shortly after that did not feel well and was having dysuria and urinary frequency and urgency.  He then was advised to pull his stent out which she did.  He continues to have occasional urinary frequency and urgency and mild dysuria.  He had a low-grade fever of 100.1.  No nausea or vomiting or abdominal pain.  He admitted to chills without reported measured fever.  No cough or wheezing or hemoptysis.  He was noted to have shuffling gait in the ER.  No paresthesias or focal muscle weakness.  He admitted to tremors in the left hand.  No chest pain or palpitations or dyspnea or cough or wheezing.  ED Course: Upon presentation to the emergency room, BP was 82/68 with respiratory rate of 30 and later BP was 123/68.  CMP revealed BUN of 27 and creatinine 1.65 compared to 25/1.04 on 02/05/2021..  High-sensitivity troponin I was 117 and later 99.  Lactic acid 1.2 and later 1.6.  CBC showed leukocytosis of 12.3 with leukocytosis and mild anemia.  UA was positive for UTI.  Urine and blood cultures were sent.   Refael Fulop  VOZ:[id number] DOB: 1955/10/02 DOA: 08/10/2021 PCP: Latanya Maudlin, NP   Assessment & Plan:   Principal Problem:   Sepsis due to gram-negative UTI ([place])  Sepsis: met criteria w/ tachypnea, leukocytosis & UTI. Continue on IV rocephin and IVFs. Blood cxs NGTD  UTI: urine cx is pending. Continue on IV rocephin.   Elevated troponin: likely secondary to demand  ischemia. Trending down. Echo ordered.  Cardio consulted by admitting doc  Syncope: etiology unclear. Possibly cardiac vs neurogenic vs dehydration. Orthostatic vitals ordered   Abnormal gait/shuffling gait: w/ associated tremors. No further inpatient neuro work-up recommended as per neuro  AKI: possibly secondary to UTI. Cr is trending down from day prior. Continue on IVFs  Hypokalemia: potassium ordered    DVT prophylaxis: eliquis Code Status: full  Family Communication: discussed pt's care w/ pt's family at bedside and answered their questions  Disposition Plan: likely d/c back home   Level of care: Telemetry Cardiac  Status is: Inpatient  Remains inpatient appropriate because: severity of illness     Consultants:  Cardio Neuro    Procedures:   Antimicrobials: rocephin   Subjective: Pt c/o fatigue   Objective: Vitals:   08/11/21 1015 08/11/21 1021 08/11/21 1151 08/11/21 1500  BP: (!) 146/72 (!) 146/72 (!) 140/99 121/64  Pulse: 83 83 75 73  Resp: (!) _0 Temp:      TempSrc:      SpO2: 97%  96% 96%   No intake or output data in the 24 hours ending 08/11/21 1616 There were no vitals filed for this visit.  Examination:  General exam: Appears calm and comfortable  Respiratory system: Clear to auscultation. Respiratory effort normal. Cardiovascular system: S1 & S2+ No rubs, gallops or clicks.  Gastrointestinal system: Abdomen is obese, soft and nontender. Normal bowel sounds heard. Central nervous system:  Alert and oriented. Moves all extremities  Psychiatry: Judgement and insight appear normal. Flat mood and affect     Data Reviewed: I have personally reviewed following labs and imaging studies  CBC: Recent Labs  Lab 08/10/21 1648 08/11/21 1015  WBC 12.3* 8.2  NEUTROABS 10.0*  --   HGB 12.1* 11.0*  HCT 36.2* 33.3*  MCV 85.8 86.0  PLT 191 601   Basic Metabolic Panel: Recent Labs  Lab 08/10/21 1648 08/11/21 1015  NA 136 133*  K 3.5  3.3*  CL 102 102  CO2 25 26  GLUCOSE 129* 137*  BUN 27* 25*  CREATININE 1.65* 1.29*  CALCIUM 8.9 8.5*   GFR: Estimated Creatinine Clearance: 73.3 mL/min (A) (by C-G formula based on SCr of 1.29 mg/dL (H)). Liver Function Tests: Recent Labs  Lab 08/10/21 1648  AST 36  ALT 35  ALKPHOS 76  BILITOT 1.1  PROT 7.5  ALBUMIN 3.3*   No results for input(s): LIPASE, AMYLASE in the last 168 hours. No results for input(s): AMMONIA in the last 168 hours. Coagulation Profile: Recent Labs  Lab 08/11/21 1015  INR 1.4*   Cardiac Enzymes: No results for input(s): CKTOTAL, CKMB, CKMBINDEX, TROPONINI in the last 168 hours. BNP (last 3 results) No results for input(s): PROBNP in the last 8760 hours. HbA1C: No results for input(s): HGBA1C in the last 72 hours. CBG: No results for input(s): GLUCAP in the last 168 hours. Lipid Profile: No results for input(s): CHOL, HDL, LDLCALC, TRIG, CHOLHDL, LDLDIRECT in the last 72 hours. Thyroid Function Tests: No results for input(s): TSH, T4TOTAL, FREET4, T3FREE, THYROIDAB in the last 72 hours. Anemia Panel: No results for input(s): VITAMINB12, FOLATE, FERRITIN, TIBC, IRON, RETICCTPCT in the last 72 hours. Sepsis Labs: Recent Labs  Lab 08/10/21 1648 08/10/21 1700 08/10/21 1910 08/11/21 1015  PROCALCITON 0.84  --   --  0.79  LATICACIDVEN  --  1.2 1.6  --     Recent Results (from the past 240 hour(s))  Blood culture (routine x 2)     Status: None (Preliminary result)   Collection Time: 08/10/21  7:10 PM   Specimen: BLOOD  Result Value Ref Range Status   Specimen Description BLOOD LEFT ANTECUBITAL  Final   Special Requests   Final    BOTTLES DRAWN AEROBIC AND ANAEROBIC Blood Culture adequate volume   Culture   Final    NO GROWTH < 12 HOURS Performed at [facility], [address], [place], [place] [address]    Report Status PENDING  Incomplete  Blood culture (routine x 2)     Status: None (Preliminary result)   Collection  Time: 08/10/21  7:43 PM   Specimen: BLOOD  Result Value Ref Range Status   Specimen Description BLOOD RAC  Final   Special Requests BOTTLES DRAWN AEROBIC AND ANAEROBIC BCAV  Final   Culture   Final    NO GROWTH < 12 HOURS Performed at [facility], [address], [place], [place] [address]    Report Status PENDING  Incomplete  Resp Panel by RT-PCR (Flu A&B, Covid) Nasopharyngeal Swab     Status: None   Collection Time: 08/10/21  7:43 PM   Specimen: Nasopharyngeal Swab; Nasopharyngeal(NP) swabs in vial transport medium  Result Value Ref Range Status   SARS Coronavirus 2 by RT PCR NEGATIVE NEGATIVE Final    Comment: (NOTE) SARS-CoV-2 target nucleic acids are NOT DETECTED.  The SARS-CoV-2 RNA is generally detectable in upper respiratory specimens during the  acute phase of infection. The lowest concentration of SARS-CoV-2 viral copies this assay can detect is 138 copies/mL. A negative result does not preclude SARS-Cov-2 infection and should not be used as the sole basis for treatment or other patient management decisions. A negative result may occur with  improper specimen collection/handling, submission of specimen other than nasopharyngeal swab, presence of viral mutation(s) within the areas targeted by this assay, and inadequate number of viral copies(<138 copies/mL). A negative result must be combined with clinical observations, patient history, and epidemiological information. The expected result is Negative.  Fact Sheet for Patients:  EntrepreneurPulse.com.au  Fact Sheet for Healthcare Providers:  IncredibleEmployment.be  This test is no t yet approved or cleared by the [place] FDA and  has been authorized for detection and/or diagnosis of SARS-CoV-2 by FDA under an Emergency Use Authorization (EUA). This EUA will remain  in effect (meaning this test can be used) for the duration of the COVID-19 declaration under Section  564(b)(1) of the Act, 21 U.S.C.section 360bbb-3(b)(1), unless the authorization is terminated  or revoked sooner.       Influenza A by PCR NEGATIVE NEGATIVE Final   Influenza B by PCR NEGATIVE NEGATIVE Final    Comment: (NOTE) The Xpert Xpress SARS-CoV-2/FLU/RSV plus assay is intended as an aid in the diagnosis of influenza from Nasopharyngeal swab specimens and should not be used as a sole basis for treatment. Nasal washings and aspirates are unacceptable for Xpert Xpress SARS-CoV-2/FLU/RSV testing.  Fact Sheet for Patients: EntrepreneurPulse.com.au  Fact Sheet for Healthcare Providers: IncredibleEmployment.be  This test is not yet approved or cleared by the [place] FDA and has been authorized for detection and/or diagnosis of SARS-CoV-2 by FDA under an Emergency Use Authorization (EUA). This EUA will remain in effect (meaning this test can be used) for the duration of the COVID-19 declaration under Section 564(b)(1) of the Act, 21 U.S.C. section 360bbb-3(b)(1), unless the authorization is terminated or revoked.  Performed at [facility], [address] [place], [place] [address]          Radiology Studies: CT HEAD WO CONTRAST (5MM)  Result Date: 08/11/2021 CLINICAL DATA:  Syncope EXAM: CT HEAD WITHOUT CONTRAST TECHNIQUE: Contiguous axial images were obtained from the base of the skull through the vertex without intravenous contrast. RADIATION DOSE REDUCTION: This exam was performed according to the departmental dose-optimization program which includes automated exposure control, adjustment of the mA and/or kV according to patient size and/or use of iterative reconstruction technique. COMPARISON:  None. FINDINGS: Brain: No acute intracranial hemorrhage, mass effect, or herniation. No extra-axial fluid collections. No evidence of acute territorial infarct. No hydrocephalus. Mild cortical volume loss. Patchy hypodensities in  the periventricular and subcortical white matter, likely secondary to chronic microvascular ischemic changes. Vascular: Calcified plaques in the carotid siphons. Skull: Normal. Negative for fracture or focal lesion. Sinuses/Orbits: No acute finding. Other: None. IMPRESSION: Chronic changes with no acute intracranial process identified. Electronically Signed   By: Ofilia Neas M.D.   On: 08/11/2021 08:59   CT Angio Chest PE W and/or Wo Contrast  Result Date: 08/10/2021 CLINICAL DATA:  Dizzy lightheaded EXAM: CT ANGIOGRAPHY CHEST WITH CONTRAST TECHNIQUE: Multidetector CT imaging of the chest was performed using the standard protocol during bolus administration of intravenous contrast. Multiplanar CT image reconstructions and MIPs were obtained to evaluate the vascular anatomy. RADIATION DOSE REDUCTION: This exam was performed according to the departmental dose-optimization program which includes automated exposure control, adjustment of the mA and/or kV according  to patient size and/or use of iterative reconstruction technique. CONTRAST:  80m OMNIPAQUE IOHEXOL 350 MG/ML SOLN COMPARISON:  Chest x-ray 08/10/2021 FINDINGS: Cardiovascular: Slightly suboptimal opacification of the pulmonary arteries. No acute pulmonary embolus is seen. Post CABG changes. Aortic valve replacement. Coronary vascular calcifications. Partially visualized intracardiac pacing leads. Borderline cardiomegaly. No pericardial effusion Mediastinum/Nodes: Midline trachea. No thyroid mass. No suspicious lymph nodes. Esophagus within normal limits Lungs/Pleura: Lungs are clear. No pleural effusion or pneumothorax. Upper Abdomen: No acute abnormality. Musculoskeletal: Post sternotomy changes. No acute osseous abnormality Review of the MIP images confirms the above findings. IMPRESSION: 1. Negative for acute pulmonary embolus. 2. Clear lung fields Aortic Atherosclerosis (ICD10-I70.0). Electronically Signed   By: KDonavan FoilM.D.   On:  08/10/2021 20:20   UKoreaCarotid Bilateral  Result Date: 08/11/2021 CLINICAL DATA:  Syncope Hypertension Hyperlipidemia EXAM: BILATERAL CAROTID DUPLEX ULTRASOUND TECHNIQUE: GPearline Cablesscale imaging, color Doppler and duplex ultrasound were performed of bilateral carotid and vertebral arteries in the neck. COMPARISON:  None. FINDINGS: Criteria: Quantification of carotid stenosis is based on velocity parameters that correlate the residual internal carotid diameter with NASCET-based stenosis levels, using the diameter of the distal internal carotid lumen as the denominator for stenosis measurement. The following velocity measurements were obtained: RIGHT ICA: 82/18 cm/sec CCA: 837/04cm/sec SYSTOLIC ICA/CCA RATIO:  1.6 ECA: 142 cm/sec LEFT ICA: 51/16 cm/sec CCA: 1888/9cm/sec SYSTOLIC ICA/CCA RATIO:  0.8 ECA: 93 cm/sec RIGHT CAROTID ARTERY: Mild focal calcified plaque noted at the carotid bifurcation. RIGHT VERTEBRAL ARTERY:  Antegrade flow. LEFT CAROTID ARTERY: Mild atheromatous plaque of the carotid bifurcation. LEFT VERTEBRAL ARTERY:  Antegrade flow. IMPRESSION: Less than 50% stenosis of the internal carotid arteries. Electronically Signed   By: FMiachel RouxM.D.   On: 08/11/2021 08:13   DG Chest Portable 1 View  Result Date: 08/10/2021 CLINICAL DATA:  Near syncope, short of breath EXAM: PORTABLE CHEST 1 VIEW COMPARISON:  03/15/2016 FINDINGS: Two frontal views of the chest demonstrate stable postsurgical changes from CABG. Dual lead pacer overlies right chest, proximal lead over right atrium and distal lead over right ventricle. Evidence of aortic valve replacement. Cardiac silhouette is mildly enlarged, likely projectional. No airspace disease, effusion, or pneumothorax. No acute bony abnormalities. IMPRESSION: 1. No acute intrathoracic process. Electronically Signed   By: MRanda NgoM.D.   On: 08/10/2021 18:50   CT Renal Stone Study  Result Date: 08/10/2021 CLINICAL DATA:  Flank pain hematuria EXAM: CT  ABDOMEN AND PELVIS WITHOUT CONTRAST TECHNIQUE: Multidetector CT imaging of the abdomen and pelvis was performed following the standard protocol without IV contrast. RADIATION DOSE REDUCTION: This exam was performed according to the departmental dose-optimization program which includes automated exposure control, adjustment of the mA and/or kV according to patient size and/or use of iterative reconstruction technique. COMPARISON:  CT 05/01/2021 FINDINGS: Lower chest: Lung bases demonstrate no acute consolidation or pleural effusion. Partially visualized intracardiac pacing leads and aortic valve prosthesis. Cardiomegaly. Hepatobiliary: No focal liver abnormality is seen. No gallstones, gallbladder wall thickening, or biliary dilatation. Pancreas: Unremarkable. No pancreatic ductal dilatation or surrounding inflammatory changes. Spleen: Normal in size without focal abnormality. Adrenals/Urinary Tract: Right adrenal gland is normal. Stable 15 mm left adrenal gland nodule, probable adenoma. Assessment of the renal collecting systems is limited due to excreted contrast within the collecting systems. Multiple low-density lesions most likely cysts. 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 Bladder evaluation limited due to dense  contrast. No hydronephrosis. Stomach/Bowel: The stomach is nonenlarged. No dilated small bowel. No acute bowel wall thickening. Vascular/Lymphatic: Advanced aortic atherosclerosis. Aorto bi-iliac stent, on the left graft material extends into the left proximal internal iliac artery. Grossly stable residual sac diameter at the left common iliac artery measuring up to 3.8 cm. Grossly stable 5 cm probable aneurysm in the region of right internal iliac artery, appears excluded by the stent. Embolization in the right posterior pelvic sidewall. No grossly enlarged lymph nodes. Reproductive: Prostate is enlarged. Other:  Negative for pelvic effusion or free air. Musculoskeletal: Degenerative changes.  No acute osseous abnormality IMPRESSION: 1. Assessment of the renal collecting systems and bladder is limited due to the presence of excreted contrast which obscures the known stone disease. There is no gross hydronephrosis. 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 2. No definite CT evidence for acute intra-abdominal or pelvic abnormality 3. Status post aortal bi-iliac stent graft. Right pelvic sidewall embolization coils with grossly stable suspected 5 cm aneurysm in the region of right internal iliac artery which may be excluded by the iliac stent. Nonemergent CTA follow-up previously suggested. Electronically Signed   By: Donavan Foil M.D.   On: 08/10/2021 21:29        Scheduled Meds:  amLODipine  10 mg Oral Daily   apixaban  5 mg Oral BID   aspirin EC  325 mg Oral Daily   cholecalciferol  1,000 Units Oral Daily   losartan  100 mg Oral Daily   metoprolol tartrate  50 mg Oral BID   rosuvastatin  40 mg Oral Daily   tamsulosin  0.4 mg Oral Daily   cyanocobalamin  1,000 mcg Oral Daily   Continuous Infusions:  0.9 % NaCl with KCl 20 mEq / L 100 mL/hr at 08/11/21 0640   cefTRIAXone (ROCEPHIN)  IV Stopped (08/11/21 0631)     LOS: 1 day    Time spent: 25 mins    Wyvonnia Dusky, MD [facility] Pager [phone]  If 7PM-7AM, please contact night-coverage 08/11/2021, 4:16 PM

## 2021-08-11 NOTE — ED Notes (Signed)
PT at bedside.

## 2021-08-11 NOTE — ED Notes (Signed)
Patient given warm wipes at this time

## 2021-08-11 NOTE — Progress Notes (Signed)
*  PRELIMINARY RESULTS* Echocardiogram 2D Echocardiogram has been performed.  Joseph Schmitt 08/11/2021, 2:44 PM

## 2021-08-11 NOTE — ED Notes (Signed)
Echo at bedside

## 2021-08-11 NOTE — Consult Note (Addendum)
CARDIOLOGY CONSULT NOTE               Patient ID: Darnelle Corp MRN: [id number] DOB/AGE: 67-Oct-1957 68 y.o.  Admit date: 08/10/2021 Referring Physician Dr. [name] Primary Physician Dr. [name] Primary Cardiologist Dr. Clayborn Bigness Reason for Consultation ?syncope  HPI: 89yoM with a PMH significant for CAD s/p CABG x5 & PCI 12/2019, STEMI in 5638 complicated by V Fib arrest, severe AS s/p TAVR 04/2020, 2* AV block s/p PPM placement 05/2020, paroxysmal atrial fibrillation, HTN, HLD, bilateral iliac artery aneurysms who presented to [facility] ED 08/10/21 after a suspected syncopal episode. Cardiology is consulted to evaluate for a possible cardiac cause of his syncope.   The patient recently underwent a cystoscopy and ureteral stenting with lithotripsy on 08/03/21 and has since been having urinary frequency, urgency "shuffling gait" per his wife. He was started on flomax and oxybutynin by urology.  His wife reports he took the oxybutynin and Flomax together the day after the procedure and subsequently vomited.  He has not taken oxybutynin since then, but has continued on tamsulosin.  His wife reports that he has not been eating as well, has been sleeping more, has and has appeared overall generally weaker since he had the urologic procedure done.  She says his baseline brought blood pressure is around in the 756E systolic Yesterday morning, his wife reports he took his tamsulosin and his blood pressure was 152/77, and 2 hours later he took his other blood pressure medications including metoprolol, amlodipine and losartan and his blood pressure dropped to 332/95 systolic.  The patient subsequently felt lightheaded and dizzy, slumped over in a chair and was difficult to arouse by his wife therefore EMS was called.  Denies chest pain or "sensations in his arms" that were similar to his prior heart attack.  Denies shortness of breath, palpitations.  He was started empirically on ceftriaxone and normal  saline with supplemental potassium in the ED.   Vitals are significant for a BP of 115/66 on admission. Temp of 100.1.   Cr elevated to 1.65 and eGFR 46, up from his baseline. Troponins trended 117-99. Lactate 1.2-1.6. slight leukocytosis at 12.3. and H/H 12.1/36.2.     Review of systems complete and found to be negative unless listed above     Past Medical History:  Diagnosis Date   2nd degree AV block    a.) s/p PPM placement 05/26/2020   Adrenal adenoma, left    a.) CT 05/01/2021: measured 64m   Aortic atherosclerosis ([facility]    Bilateral renal cysts 05/01/2021   Cardiac arrest with ventricular fibrillation ([facility] 09/23/2008   a.) in setting of acute inferior STEMI; 2 episodes requiring a single defibrillation each time prior to ROSC.   Chlamydia    Coronary artery disease    Diastolic dysfunction    a.)  TTE 06/03/2021: EF >55%; severe LA enlargement; bioprosthetic AoV well-seated with a mean transvalvular gradient of 11 mmHg; G2DD.   Excessive daytime sleepiness    Heart murmur    History of kidney stones    History of transcatheter aortic valve replacement (TAVR) 05/14/2020   a.) 23 mm Edwards Sapien S3 Ultra bioprosthetic valve   Hypercholesteremia    Hypertension    Iliac artery aneurysm ([facility] 06/30/2020   a.) CTA 06/30/2020: RIGHT common iliac artery thrombsed and measured 3.8 cm, LEFT common iliac artery measure 4.6 cm, RIGHT internal iliac artery measure 4.2 cm; s/p endovascular stenting and repair 08/19/2020.   Long term current use  of anticoagulant    a.) apixaban   Long term current use of antithrombotics/antiplatelets    a.) DAPT (ASA + clopidogrel)   NSTEMI (non-ST elevated myocardial infarction) ([place]) 03/15/2016   a.)  LHC 03/16/2016: 100% pRCA, 100% pLAD, 100% RI, 85% dLCx-1, 100% dLCx-2; LIMA-LAD and SVG-OM 3 bypass grafts patent.  60% stenosis of the SVG-RCA, 60% stenosis of the SVG-D1, and total occlusion with significant thrombus noted within the SVG-OM1  bypass graft.   PAF (paroxysmal atrial fibrillation) (HCC)    a.) CHA2DS2-VASc = 3 (age, HTN, previous MI). b.) Rate/rhythm maintained on daily metoprolol; carhnically anticoagulated with apixaban + ASA + clopidogrel   Presence of permanent cardiac pacemaker 05/26/2020   a.) Medtronic device   S/P CABG x 5 09/23/2008   a.) LIMA-LAD, SVG-D1, SVG-OM1, SVG-OM3, SVG-PDA   Severe aortic stenosis    a.) s/p TAVR 05/14/2020   ST elevation myocardial infarction (STEMI) of inferior wall ([place]) 09/23/2008   a.) VF arrest x 2; ROSC with defibrillation x 1 each time. LHC --> 70% pRCA, 30% oLM, 30% LM, 99% mLCx, 50% dLCx, 90% OM1, 20% OM3, 30% LPL1, 20% pLAD, 30% mLAD-1, 80% mLAD-2, 90% D2; refer to CVTS. Underwent 5v CABG 09/23/2008.    Past Surgical History:  Procedure Laterality Date   APPENDECTOMY     CARDIAC CATHETERIZATION N/A 03/16/2016   Procedure: Coronary/ grafts  Angiography;  Surgeon: [name], MD;  Location: [facility];  Service: Cardiovascular;  Laterality: N/A;   CARDIAC CATHETERIZATION Left 07/26/2008   Procedure: CARDIAC CATHETERIZATION; Location: [facility]   COLONOSCOPY WITH PROPOFOL N/A 12/03/2019   Procedure: COLONOSCOPY WITH PROPOFOL;  Surgeon: Toledo, Benay Pike, MD;  Location: [facility];  Service: Gastroenterology;  Laterality: N/A;   CORONARY ARTERY BYPASS GRAFT N/A 09/23/2008   CORONARY STENT PLACEMENT  2021   CYSTOSCOPY/URETEROSCOPY/HOLMIUM LASER/STENT PLACEMENT Left 08/03/2021   Procedure: CYSTOSCOPY/URETEROSCOPY/HOLMIUM LASER/STENT PLACEMENT-LEFT (POSSIBLE RIGHT);  Surgeon: Hollice Espy, MD;  Location: [facility] ORS;  Service: Urology;  Laterality: Left;   INSERT / REPLACE / REMOVE PACEMAKER  05/26/2020   Medtronic   KNEE ARTHROSCOPY Right    TRANSCATHETER AORTIC VALVE REPLACEMENT, TRANSAORTIC N/A 05/14/2020    (Not in a hospital admission)  Social History   Socioeconomic History   Marital status: Married    Spouse name: Not on file   Number of  children: Not on file   Years of education: Not on file   Highest education level: Not on file  Occupational History   Not on file  Tobacco Use   Smoking status: Never   Smokeless tobacco: Never  Vaping Use   Vaping Use: Never used  Substance and Sexual Activity   Alcohol use: No   Drug use: Never   Sexual activity: Not on file  Other Topics Concern   Not on file  Social History Narrative   Not on file   Social Determinants of Health   Financial Resource Strain: Not on file  Food Insecurity: Not on file  Transportation Needs: Not on file  Physical Activity: Not on file  Stress: Not on file  Social Connections: Not on file  Intimate Partner Violence: Not on file    Family History  Problem Relation Age of Onset   Hypertension Other       Review of systems complete and found to be negative unless listed above    PHYSICAL EXAM General: Pleasant black male, well nourished, in no acute distress.  Laying nearly flat in ED stretcher with  wife at bedside HEENT:  Normocephalic and atraumatic. Neck:  No JVD.  Lungs: Normal respiratory effort on room air. Clear bilaterally to auscultation. No wheezes, crackles, rhonchi.  Heart: HRRR . Normal S1 and S2 without gallops or murmurs. Radial & DP pulses 2+ bilaterally. Abdomen: Obese appearing.  Msk: Normal strength and tone for age. Extremities: No clubbing, cyanosis or edema.   Neuro: Alert and oriented X 3. Psych:  Mood appropriate, affect congruent.   Labs:   Lab Results  Component Value Date   WBC 12.3 (H) 08/10/2021   HGB 12.1 (L) 08/10/2021   HCT 36.2 (L) 08/10/2021   MCV 85.8 08/10/2021   PLT 191 08/10/2021    Recent Labs  Lab 08/10/21 1648  NA 136  K 3.5  CL 102  CO2 25  BUN 27*  CREATININE 1.65*  CALCIUM 8.9  PROT 7.5  BILITOT 1.1  ALKPHOS 76  ALT 35  AST 36  GLUCOSE 129*   Lab Results  Component Value Date   TROPONINI 5.06 (HH) 03/18/2016    Lab Results  Component Value Date   CHOL 239 (H)  03/15/2016   Lab Results  Component Value Date   HDL 89 03/15/2016   Lab Results  Component Value Date   LDLCALC 144 (H) 03/15/2016   Lab Results  Component Value Date   TRIG 28 03/15/2016   Lab Results  Component Value Date   CHOLHDL 2.7 03/15/2016   No results found for: LDLDIRECT    Radiology: CT Angio Chest PE W and/or Wo Contrast  Result Date: 08/10/2021 CLINICAL DATA:  Dizzy lightheaded EXAM: CT ANGIOGRAPHY CHEST WITH CONTRAST TECHNIQUE: Multidetector CT imaging of the chest was performed using the standard protocol during bolus administration of intravenous contrast. Multiplanar CT image reconstructions and MIPs were obtained to evaluate the vascular anatomy. RADIATION DOSE REDUCTION: This exam was performed according to the departmental dose-optimization program which includes automated exposure control, adjustment of the mA and/or kV according to patient size and/or use of iterative reconstruction technique. CONTRAST:  72m OMNIPAQUE IOHEXOL 350 MG/ML SOLN COMPARISON:  Chest x-ray 08/10/2021 FINDINGS: Cardiovascular: Slightly suboptimal opacification of the pulmonary arteries. No acute pulmonary embolus is seen. Post CABG changes. Aortic valve replacement. Coronary vascular calcifications. Partially visualized intracardiac pacing leads. Borderline cardiomegaly. No pericardial effusion Mediastinum/Nodes: Midline trachea. No thyroid mass. No suspicious lymph nodes. Esophagus within normal limits Lungs/Pleura: Lungs are clear. No pleural effusion or pneumothorax. Upper Abdomen: No acute abnormality. Musculoskeletal: Post sternotomy changes. No acute osseous abnormality Review of the MIP images confirms the above findings. IMPRESSION: 1. Negative for acute pulmonary embolus. 2. Clear lung fields Aortic Atherosclerosis (ICD10-I70.0). Electronically Signed   By: KDonavan FoilM.D.   On: 08/10/2021 20:20   DG Chest Portable 1 View  Result Date: 08/10/2021 CLINICAL DATA:  Near syncope,  short of breath EXAM: PORTABLE CHEST 1 VIEW COMPARISON:  03/15/2016 FINDINGS: Two frontal views of the chest demonstrate stable postsurgical changes from CABG. Dual lead pacer overlies right chest, proximal lead over right atrium and distal lead over right ventricle. Evidence of aortic valve replacement. Cardiac silhouette is mildly enlarged, likely projectional. No airspace disease, effusion, or pneumothorax. No acute bony abnormalities. IMPRESSION: 1. No acute intrathoracic process. Electronically Signed   By: MRanda NgoM.D.   On: 08/10/2021 18:50   DG OR UROLOGY CYSTO IMAGE ([facility]  Result Date: 08/03/2021 There is no interpretation for this exam.  This order is for images obtained during a surgical procedure.  Please  See "Surgeries" Tab for more information regarding the procedure.   CT Renal Stone Study  Result Date: 08/10/2021 CLINICAL DATA:  Flank pain hematuria EXAM: CT ABDOMEN AND PELVIS WITHOUT CONTRAST TECHNIQUE: Multidetector CT imaging of the abdomen and pelvis was performed following the standard protocol without IV contrast. RADIATION DOSE REDUCTION: This exam was performed according to the departmental dose-optimization program which includes automated exposure control, adjustment of the mA and/or kV according to patient size and/or use of iterative reconstruction technique. COMPARISON:  CT 05/01/2021 FINDINGS: Lower chest: Lung bases demonstrate no acute consolidation or pleural effusion. Partially visualized intracardiac pacing leads and aortic valve prosthesis. Cardiomegaly. Hepatobiliary: No focal liver abnormality is seen. No gallstones, gallbladder wall thickening, or biliary dilatation. Pancreas: Unremarkable. No pancreatic ductal dilatation or surrounding inflammatory changes. Spleen: Normal in size without focal abnormality. Adrenals/Urinary Tract: Right adrenal gland is normal. Stable 15 mm left adrenal gland nodule, probable adenoma. Assessment of the renal collecting  systems is limited due to excreted contrast within the collecting systems. Multiple low-density lesions most likely cysts. 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 Bladder evaluation limited due to dense contrast. No hydronephrosis. Stomach/Bowel: The stomach is nonenlarged. No dilated small bowel. No acute bowel wall thickening. Vascular/Lymphatic: Advanced aortic atherosclerosis. Aorto bi-iliac stent, on the left graft material extends into the left proximal internal iliac artery. Grossly stable residual sac diameter at the left common iliac artery measuring up to 3.8 cm. Grossly stable 5 cm probable aneurysm in the region of right internal iliac artery, appears excluded by the stent. Embolization in the right posterior pelvic sidewall. No grossly enlarged lymph nodes. Reproductive: Prostate is enlarged. Other: Negative for pelvic effusion or free air. Musculoskeletal: Degenerative changes.  No acute osseous abnormality IMPRESSION: 1. Assessment of the renal collecting systems and bladder is limited due to the presence of excreted contrast which obscures the known stone disease. There is no gross hydronephrosis. 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 2. No definite CT evidence for acute intra-abdominal or pelvic abnormality 3. Status post aortal bi-iliac stent graft. Right pelvic sidewall embolization coils with grossly stable suspected 5 cm aneurysm in the region of right internal iliac artery which may be excluded by the iliac stent. Nonemergent CTA follow-up previously suggested. Electronically Signed   By: Donavan Foil M.D.   On: 08/10/2021 21:29    ECHO 06/2021 EF 55 % 67mHg mean grad across the prosthetic aortic valve, 2.4 m/sec pk velocity, DI 0.53.   TELEMETRY reviewed by me: NSR rate 77  EKG reviewed by me: NSR rate 94 and LVH  ASSESSMENT AND PLAN:  65yoM with a PMH significant for CAD s/p CABG x5 &  PCI 12/2019, STEMI in 21700complicated by V Fib arrest, severe AS s/p TAVR 04/2020, 2* AV block s/p PPM placement 05/2020, paroxysmal atrial fibrillation, HTN, HLD, bilateral iliac artery aneurysms [facility]. He had Cardiology is consulted to evaluate for a possible cardiac cause of his syncope.   #?syncope versus orthostasis  #2* AV block s/p medtronic PPM placement 05/2020 #elevated troponin, likely demand ischemia The patient had 1 episode of feeling dizzy, weak, and subsequently passing out for 2 to 3 minutes while slumped in a chair per his wife the morning of 1/16.  This episode was in the setting of taking a new medication, tamsulosin and 2 hours later taking the remainder of his  blood pressure medications which resulted in a blood pressure of 105/67 per his wife - while his baseline is usually 275 systolic.  Additionally, the patient has not been feeling at his baseline over the past week, including not eating well, sleeping well, and feeling generally weaker than usual. -troponin 117-99. Patient is without chest pain or sensations in his arms similar to his previous heart attacks. This likely represents demand ischemia from hypotension. -currently in NSR with rate of 77bpm, LVH -medtronic pacemaker interrogation performed at bedside by me did not reveal any transmissions on 1/16 when the patient had his syncopal episode.  -agree with repeat echocardiogram -agree with CT head -monitor on telemetry while inpatient  -orthostatic vitals -recommend giving patient flomax in the evening, rather than with his other morning medications  #UTI  #urinary frequency, s/p lithotripsy & stent 1/9 -On empiric antibiotics  -mgmt per primary team   #paroxysmal atrial fibrillation on eliquis  -currently in NSR -continue medications as below  -CHADS2VASC 3  #CAD s/p CABG x5 & PCI 12/2019, STEMI in 1700 complicated by V Fib arrest #severe AS s/p  TAVR 04/2020 #HTN #hyperlipidemia Home medications include amlodipine 45m once daily, losartan 1061monce dialy, metoprolol 2551mwice daily, and crestor 10m72mce daily, aspirin 81mg75m eliquis 5mg B20m #hypokalemia -K+ 3.3 today, replete and monitor  This patient's plan of care was discussed and created with Dr. Kashauna Celmer Serafina Royalse is in agreement.  Signed: Lily MTristan SchroederC 08/11/2021, 8:05 AM  The patient has been interviewed and examined. I agree with assessment and plan above. Reiko Vinje Serafina RoyalsCCFranklin Woods Community Hospital

## 2021-08-12 DIAGNOSIS — Z951 Presence of aortocoronary bypass graft: Secondary | ICD-10-CM

## 2021-08-12 DIAGNOSIS — R778 Other specified abnormalities of plasma proteins: Secondary | ICD-10-CM

## 2021-08-12 DIAGNOSIS — A419 Sepsis, unspecified organism: Secondary | ICD-10-CM

## 2021-08-12 DIAGNOSIS — I480 Paroxysmal atrial fibrillation: Secondary | ICD-10-CM

## 2021-08-12 DIAGNOSIS — R6520 Severe sepsis without septic shock: Secondary | ICD-10-CM

## 2021-08-12 LAB — BASIC METABOLIC PANEL
Anion gap: 7 (ref 5–15)
BUN: 20 mg/dL (ref 8–23)
CO2: 24 mmol/L (ref 22–32)
Calcium: 8.9 mg/dL (ref 8.9–10.3)
Chloride: 104 mmol/L (ref 98–111)
Creatinine, Ser: 1.27 mg/dL — ABNORMAL HIGH (ref 0.61–1.24)
GFR, Estimated: >60 mL/min (ref >60)
Glucose, Bld: 107 mg/dL — ABNORMAL HIGH (ref 70–99)
Potassium: 3.7 mmol/L (ref 3.5–5.1)
Sodium: 135 mmol/L (ref 135–145)

## 2021-08-12 LAB — ECHOCARDIOGRAM COMPLETE
AR max vel: 2.14 cm2
AV Area VTI: 2.17 cm2
AV Area mean vel: 2.2 cm2
AV Mean grad: 7 mmHg
AV Peak grad: 16.2 mmHg
Ao pk vel: 2.01 m/s
Area-P 1/2: 3.77 cm2
MV VTI: 3.17 cm2
S' Lateral: 3.51 cm

## 2021-08-12 LAB — CBC
HCT: 32.7 % — ABNORMAL LOW (ref 39.0–52.0)
Hemoglobin: 10.9 g/dL — ABNORMAL LOW (ref 13.0–17.0)
MCH: 28.5 pg (ref 26.0–34.0)
MCHC: 33.3 g/dL (ref 30.0–36.0)
MCV: 85.6 fL (ref 80.0–100.0)
Platelets: 183 10*3/uL (ref 150–400)
RBC: 3.82 MIL/uL — ABNORMAL LOW (ref 4.22–5.81)
RDW: 14.6 % (ref 11.5–15.5)
WBC: 7.5 10*3/uL (ref 4.0–10.5)
nRBC: 0 % (ref 0.0–0.2)

## 2021-08-12 LAB — URINE CULTURE: Culture: >=100000 — AB

## 2021-08-12 MED ORDER — CEFADROXIL 500 MG PO CAPS: 500.0000 mg | ORAL_CAPSULE | Freq: Two times a day (BID) | ORAL | 0 refills | Status: DC

## 2021-08-12 MED ORDER — CEFADROXIL 500 MG PO CAPS: 1000.0000 mg | ORAL_CAPSULE | Freq: Two times a day (BID) | ORAL | 0 refills | Status: AC

## 2021-08-12 MED ORDER — TAMSULOSIN HCL 0.4 MG PO CAPS: 0.4000 mg | ORAL_CAPSULE | Freq: Every day | ORAL | 0 refills | Status: DC

## 2021-08-12 NOTE — Progress Notes (Signed)
CARDIOLOGY CONSULT NOTE               Patient ID: Joseph Schmitt MRN: [id number] DOB/AGE: [id number] 67 y.o.  Admit date: 08/10/2021 Referring Physician Dr. [name] Primary Physician Dr. [name] Primary Cardiologist [name] cardiology) Reason for Consultation ?syncope  HPI: 66yoM with a PMH significant for CAD s/p CABG x5 & PCI 12/2019, STEMI in 4332 complicated by V Fib arrest, severe AS s/p TAVR 04/2020, 2* AV block s/p PPM placement 05/2020, paroxysmal atrial fibrillation, HTN, HLD, bilateral iliac artery aneurysms who presented to [facility] ED 08/10/21 after a suspected syncopal episode. Cardiology is consulted to evaluate for a possible cardiac cause of his syncope.   Interval history: -no acute events. Denies chest pain, SOB, palpitations.  -Echocardiogram resulted LVEF 95-18%, grade 1 diastolic dysfunction, mild MR, trivial AR normal function of bioprosthetic AV   Review of systems complete and found to be negative unless listed above     Past Medical History:  Diagnosis Date   2nd degree AV block    a.) s/p PPM placement 05/26/2020   Adrenal adenoma, left    a.) CT 05/01/2021: measured 33mm   Aortic atherosclerosis ([place])    Bilateral renal cysts 05/01/2021   Cardiac arrest with ventricular fibrillation ([place]) 09/23/2008   a.) in setting of acute inferior STEMI; 2 episodes requiring a single defibrillation each time prior to ROSC.   Chlamydia    Coronary artery disease    Diastolic dysfunction    a.)  TTE 06/03/2021: EF >55%; severe LA enlargement; bioprosthetic AoV well-seated with a mean transvalvular gradient of 11 mmHg; G2DD.   Excessive daytime sleepiness    Heart murmur    History of kidney stones    History of transcatheter aortic valve replacement (TAVR) 05/14/2020   a.) 23 mm Edwards Sapien S3 Ultra bioprosthetic valve   Hypercholesteremia    Hypertension    Iliac artery aneurysm ([place]) 06/30/2020   a.) CTA 06/30/2020: RIGHT common iliac  artery thrombsed and measured 3.8 cm, LEFT common iliac artery measure 4.6 cm, RIGHT internal iliac artery measure 4.2 cm; s/p endovascular stenting and repair 08/19/2020.   Long term current use of anticoagulant    a.) apixaban   Long term current use of antithrombotics/antiplatelets    a.) DAPT (ASA + clopidogrel)   NSTEMI (non-ST elevated myocardial infarction) ([place]) 03/15/2016   a.)  LHC 03/16/2016: 100% pRCA, 100% pLAD, 100% RI, 85% dLCx-1, 100% dLCx-2; LIMA-LAD and SVG-OM 3 bypass grafts patent.  60% stenosis of the SVG-RCA, 60% stenosis of the SVG-D1, and total occlusion with significant thrombus noted within the SVG-OM1 bypass graft.   PAF (paroxysmal atrial fibrillation) (HCC)    a.) CHA2DS2-VASc = 3 (age, HTN, previous MI). b.) Rate/rhythm maintained on daily metoprolol; carhnically anticoagulated with apixaban + ASA + clopidogrel   Presence of permanent cardiac pacemaker 05/26/2020   a.) Medtronic device   S/P CABG x 5 09/23/2008   a.) LIMA-LAD, SVG-D1, SVG-OM1, SVG-OM3, SVG-PDA   Severe aortic stenosis    a.) s/p TAVR 05/14/2020   ST elevation myocardial infarction (STEMI) of inferior wall ([place]) 09/23/2008   a.) VF arrest x 2; ROSC with defibrillation x 1 each time. LHC --> 70% pRCA, 30% oLM, 30% LM, 99% mLCx, 50% dLCx, 90% OM1, 20% OM3, 30% LPL1, 20% pLAD, 30% mLAD-1, 80% mLAD-2, 90% D2; refer to CVTS. Underwent 5v CABG 09/23/2008.    Past Surgical History:  Procedure Laterality Date   APPENDECTOMY     CARDIAC  CATHETERIZATION N/A 03/16/2016   Procedure: Coronary/ grafts  Angiography;  Surgeon: [name], MD;  Location: [place] [facility];  Service: Cardiovascular;  Laterality: N/A;   CARDIAC CATHETERIZATION Left 07/26/2008   Procedure: CARDIAC CATHETERIZATION; Location: [facility]   COLONOSCOPY WITH PROPOFOL N/A 12/03/2019   Procedure: COLONOSCOPY WITH PROPOFOL;  Surgeon: Toledo, Benay Pike, MD;  Location: [facility] ENDOSCOPY;  Service: Gastroenterology;  Laterality: N/A;    CORONARY ARTERY BYPASS GRAFT N/A 09/23/2008   CORONARY STENT PLACEMENT  2021   CYSTOSCOPY/URETEROSCOPY/HOLMIUM LASER/STENT PLACEMENT Left 08/03/2021   Procedure: CYSTOSCOPY/URETEROSCOPY/HOLMIUM LASER/STENT PLACEMENT-LEFT (POSSIBLE RIGHT);  Surgeon: Hollice Espy, MD;  Location: [facility] ORS;  Service: Urology;  Laterality: Left;   INSERT / REPLACE / REMOVE PACEMAKER  05/26/2020   Medtronic   KNEE ARTHROSCOPY Right    TRANSCATHETER AORTIC VALVE REPLACEMENT, TRANSAORTIC N/A 05/14/2020    Medications Prior to Admission  Medication Sig Dispense Refill Last Dose   amLODipine (NORVASC) 10 MG tablet Take 10 mg by mouth daily.   Past Week   aspirin 81 MG chewable tablet Chew 1 tablet (81 mg total) by mouth daily. 120 tablet 0 Past Week   cholecalciferol (VITAMIN D3) 25 MCG (1000 UNIT) tablet Take 1,000 Units by mouth daily.   Past Week   cyanocobalamin 1000 MCG tablet Take 1,000 mcg by mouth daily.   Past Week   ELIQUIS 5 MG TABS tablet Take 5 mg by mouth 2 (two) times daily.   Past Week   HYDROcodone-acetaminophen (NORCO/VICODIN) 5-325 MG tablet Take 1-2 tablets by mouth every 6 (six) hours as needed for moderate pain. 10 tablet 0 Past Week   losartan (COZAAR) 100 MG tablet Take 100 mg by mouth daily.   Past Week   metoprolol tartrate (LOPRESSOR) 25 MG tablet Take 25 mg by mouth 2 (two) times daily.   Past Week   oxybutynin (DITROPAN) 5 MG tablet Take 1 tablet (5 mg total) by mouth every 8 (eight) hours as needed for bladder spasms. 30 tablet 0 Past Week   rosuvastatin (CRESTOR) 40 MG tablet Take 40 mg by mouth daily.   Past Week   tamsulosin (FLOMAX) 0.4 MG CAPS capsule Take 1 capsule (0.4 mg total) by mouth daily. 30 capsule 0 Past Week    Social History   Socioeconomic History   Marital status: Married    Spouse name: Not on file   Number of children: Not on file   Years of education: Not on file   Highest education level: Not on file  Occupational History   Not on file  Tobacco Use    Smoking status: Never   Smokeless tobacco: Never  Vaping Use   Vaping Use: Never used  Substance and Sexual Activity   Alcohol use: No   Drug use: Never   Sexual activity: Not on file  Other Topics Concern   Not on file  Social History Narrative   Not on file   Social Determinants of Health   Financial Resource Strain: Not on file  Food Insecurity: Not on file  Transportation Needs: Not on file  Physical Activity: Not on file  Stress: Not on file  Social Connections: Not on file  Intimate Partner Violence: Not on file    Family History  Problem Relation Age of Onset   Hypertension Other       Review of systems complete and found to be negative unless listed above    PHYSICAL EXAM General: Pleasant black male, well nourished, in no acute distress. Sitting  upright in PCU bed.  HEENT:  Normocephalic and atraumatic. Neck:  No JVD.  Lungs: Normal respiratory effort on room air. Clear bilaterally to auscultation. No wheezes, crackles, rhonchi.  Heart: HRRR . Normal S1 and S2 without gallops or murmurs.  Abdomen: Obese appearing.  Msk: Normal strength and tone for age. Extremities: No clubbing, cyanosis or edema.   Neuro: Alert and oriented X 3. Psych:  Mood appropriate, affect congruent.   Labs:   Lab Results  Component Value Date   WBC 7.5 08/12/2021   HGB 10.9 (L) 08/12/2021   HCT 32.7 (L) 08/12/2021   MCV 85.6 08/12/2021   PLT 183 08/12/2021    Recent Labs  Lab 08/10/21 1648 08/11/21 1015 08/12/21 0553  NA 136   < > 135  K 3.5   < > 3.7  CL 102   < > 104  CO2 25   < > 24  BUN 27*   < > 20  CREATININE 1.65*   < > 1.27*  CALCIUM 8.9   < > 8.9  PROT 7.5  --   --   BILITOT 1.1  --   --   ALKPHOS 76  --   --   ALT 35  --   --   AST 36  --   --   GLUCOSE 129*   < > 107*   < > = values in this interval not displayed.    Lab Results  Component Value Date   TROPONINI 5.06 [facility]) 03/18/2016     Lab Results  Component Value Date   CHOL 239 (H)  03/15/2016   Lab Results  Component Value Date   HDL 89 03/15/2016   Lab Results  Component Value Date   LDLCALC 144 (H) 03/15/2016   Lab Results  Component Value Date   TRIG 28 03/15/2016   Lab Results  Component Value Date   CHOLHDL 2.7 03/15/2016   No results found for: LDLDIRECT    Radiology: CT HEAD WO CONTRAST (5MM)  Result Date: 08/11/2021 CLINICAL DATA:  Syncope EXAM: CT HEAD WITHOUT CONTRAST TECHNIQUE: Contiguous axial images were obtained from the base of the skull through the vertex without intravenous contrast. RADIATION DOSE REDUCTION: This exam was performed according to the departmental dose-optimization program which includes automated exposure control, adjustment of the mA and/or kV according to patient size and/or use of iterative reconstruction technique. COMPARISON:  None. FINDINGS: Brain: No acute intracranial hemorrhage, mass effect, or herniation. No extra-axial fluid collections. No evidence of acute territorial infarct. No hydrocephalus. Mild cortical volume loss. Patchy hypodensities in the periventricular and subcortical white matter, likely secondary to chronic microvascular ischemic changes. Vascular: Calcified plaques in the carotid siphons. Skull: Normal. Negative for fracture or focal lesion. Sinuses/Orbits: No acute finding. Other: None. IMPRESSION: Chronic changes with no acute intracranial process identified. Electronically Signed   By: Ofilia Neas M.D.   On: 08/11/2021 08:59   CT Angio Chest PE W and/or Wo Contrast  Result Date: 08/10/2021 CLINICAL DATA:  Dizzy lightheaded EXAM: CT ANGIOGRAPHY CHEST WITH CONTRAST TECHNIQUE: Multidetector CT imaging of the chest was performed using the standard protocol during bolus administration of intravenous contrast. Multiplanar CT image reconstructions and MIPs were obtained to evaluate the vascular anatomy. RADIATION DOSE REDUCTION: This exam was performed according to the departmental dose-optimization  program which includes automated exposure control, adjustment of the mA and/or kV according to patient size and/or use of iterative reconstruction technique. CONTRAST:  3mL OMNIPAQUE IOHEXOL 350 MG/ML SOLN  COMPARISON:  Chest x-ray 08/10/2021 FINDINGS: Cardiovascular: Slightly suboptimal opacification of the pulmonary arteries. No acute pulmonary embolus is seen. Post CABG changes. Aortic valve replacement. Coronary vascular calcifications. Partially visualized intracardiac pacing leads. Borderline cardiomegaly. No pericardial effusion Mediastinum/Nodes: Midline trachea. No thyroid mass. No suspicious lymph nodes. Esophagus within normal limits Lungs/Pleura: Lungs are clear. No pleural effusion or pneumothorax. Upper Abdomen: No acute abnormality. Musculoskeletal: Post sternotomy changes. No acute osseous abnormality Review of the MIP images confirms the above findings. IMPRESSION: 1. Negative for acute pulmonary embolus. 2. Clear lung fields Aortic Atherosclerosis (ICD10-I70.0). Electronically Signed   By: Donavan Foil M.D.   On: 08/10/2021 20:20   US Carotid Bilateral  Result Date: 08/11/2021 CLINICAL DATA:  Syncope Hypertension Hyperlipidemia EXAM: BILATERAL CAROTID DUPLEX ULTRASOUND TECHNIQUE: Pearline Cables scale imaging, color Doppler and duplex ultrasound were performed of bilateral carotid and vertebral arteries in the neck. COMPARISON:  None. FINDINGS: Criteria: Quantification of carotid stenosis is based on velocity parameters that correlate the residual internal carotid diameter with NASCET-based stenosis levels, using the diameter of the distal internal carotid lumen as the denominator for stenosis measurement. The following velocity measurements were obtained: RIGHT ICA: 82/18 cm/sec CCA: 76/72 cm/sec SYSTOLIC ICA/CCA RATIO:  1.6 ECA: 142 cm/sec LEFT ICA: 51/16 cm/sec CCA: 094/7 cm/sec SYSTOLIC ICA/CCA RATIO:  0.8 ECA: 93 cm/sec RIGHT CAROTID ARTERY: Mild focal calcified plaque noted at the carotid  bifurcation. RIGHT VERTEBRAL ARTERY:  Antegrade flow. LEFT CAROTID ARTERY: Mild atheromatous plaque of the carotid bifurcation. LEFT VERTEBRAL ARTERY:  Antegrade flow. IMPRESSION: Less than 50% stenosis of the internal carotid arteries. Electronically Signed   By: Miachel Roux M.D.   On: 08/11/2021 08:13   DG Chest Portable 1 View  Result Date: 08/10/2021 CLINICAL DATA:  Near syncope, short of breath EXAM: PORTABLE CHEST 1 VIEW COMPARISON:  03/15/2016 FINDINGS: Two frontal views of the chest demonstrate stable postsurgical changes from CABG. Dual lead pacer overlies right chest, proximal lead over right atrium and distal lead over right ventricle. Evidence of aortic valve replacement. Cardiac silhouette is mildly enlarged, likely projectional. No airspace disease, effusion, or pneumothorax. No acute bony abnormalities. IMPRESSION: 1. No acute intrathoracic process. Electronically Signed   By: Randa Ngo M.D.   On: 08/10/2021 18:50   DG OR UROLOGY CYSTO IMAGE ([place])  Result Date: 08/03/2021 There is no interpretation for this exam.  This order is for images obtained during a surgical procedure.  Please See "Surgeries" Tab for more information regarding the procedure.   ECHOCARDIOGRAM COMPLETE  Result Date: 08/12/2021    ECHOCARDIOGRAM REPORT   Patient Name:   ZAHEER WAGEMAN Date of Exam: 08/11/2021 Medical Rec #:  [id number]          Height:       71.0 in Accession #:    [id number]         Weight:       258.0 lb Date of Birth:  14-Dec-1955          BSA:          2.350 m Patient Age:    30 years           BP:           140/99 mmHg Patient Gender: M                  HR:           773 bpm. Exam Location:  [facility] Procedure: 2D Echo, Color Doppler, Cardiac Doppler and  Intracardiac            Opacification Agent Indications:     Elevated troponin  History:         Patient has prior history of Echocardiogram examinations. CAD,                  Pacemaker and Prior CABG, TAVR 2021; Risk Factors:Hypertension                   and HCL.  Sonographer:     Charmayne Sheer Referring Phys:  [id number] [place] Diagnosing Phys: Serafina Royals MD  Sonographer Comments: Technically difficult study due to poor echo windows. Image acquisition challenging due to patient body habitus. IMPRESSIONS  1. Left ventricular ejection fraction, by estimation, is 55 to 60%. The left ventricle has normal function. The left ventricle has no regional wall motion abnormalities. Left ventricular diastolic parameters are consistent with Grade I diastolic dysfunction (impaired relaxation).  2. Right ventricular systolic function is normal. The right ventricular size is normal.  3. The mitral valve is normal in structure. Mild mitral valve regurgitation.  4. The aortic valve is calcified. Aortic valve regurgitation is trivial. Echo findings are consistent with normal structure and function of the aortic valve prosthesis. FINDINGS  Left Ventricle: Left ventricular ejection fraction, by estimation, is 55 to 60%. The left ventricle has normal function. The left ventricle has no regional wall motion abnormalities. Definity contrast agent was given IV to delineate the left ventricular  endocardial borders. The left ventricular internal cavity size was normal in size. There is no left ventricular hypertrophy. Left ventricular diastolic parameters are consistent with Grade I diastolic dysfunction (impaired relaxation). Right Ventricle: The right ventricular size is normal. No increase in right ventricular wall thickness. Right ventricular systolic function is normal. Left Atrium: Left atrial size was normal in size. Right Atrium: Right atrial size was normal in size. Pericardium: There is no evidence of pericardial effusion. Mitral Valve: The mitral valve is normal in structure. Mild mitral valve regurgitation. MV peak gradient, 2.4 mmHg. The mean mitral valve gradient is 1.0 mmHg. Tricuspid Valve: The tricuspid valve is normal in structure. Tricuspid valve  regurgitation is mild. Aortic Valve: The aortic valve is calcified. Aortic valve regurgitation is trivial. Aortic valve mean gradient measures 7.0 mmHg. Aortic valve peak gradient measures 16.2 mmHg. Aortic valve area, by VTI measures 2.17 cm. There is a bioprosthetic valve present in the aortic position. Echo findings are consistent with normal structure and function of the aortic valve prosthesis. Pulmonic Valve: The pulmonic valve was normal in structure. Pulmonic valve regurgitation is not visualized. Aorta: The aortic root and ascending aorta are structurally normal, with no evidence of dilitation. IAS/Shunts: No atrial level shunt detected by color flow Doppler.  LEFT VENTRICLE PLAX 2D LVIDd:         5.29 cm   Diastology LVIDs:         3.51 cm   LV e' medial:    6.09 cm/s LV PW:         1.32 cm   LV E/e' medial:  11.5 LV IVS:        1.04 cm   LV e' lateral:   8.59 cm/s LVOT diam:     2.00 cm   LV E/e' lateral: 8.1 LV SV:         71 LV SV Index:   30 LVOT Area:     3.14 cm  RIGHT VENTRICLE RV Basal diam:  3.69 cm LEFT ATRIUM             Index        RIGHT ATRIUM           Index LA diam:        4.50 cm 1.92 cm/m   RA Area:     16.30 cm LA Vol (A2C):   97.6 ml 41.54 ml/m  RA Volume:   39.90 ml  16.98 ml/m LA Vol (A4C):   44.0 ml 18.73 ml/m LA Biplane Vol: 65.8 ml 28.01 ml/m  AORTIC VALVE                     PULMONIC VALVE AV Area (Vmax):    2.14 cm      PV Vmax:       1.08 m/s AV Area (Vmean):   2.20 cm      PV Vmean:      70.000 cm/s AV Area (VTI):     2.17 cm      PV VTI:        0.155 m AV Vmax:           201.00 cm/s   PV Peak grad:  4.7 mmHg AV Vmean:          122.000 cm/s  PV Mean grad:  2.0 mmHg AV VTI:            0.326 m AV Peak Grad:      16.2 mmHg AV Mean Grad:      7.0 mmHg LVOT Vmax:         137.00 cm/s LVOT Vmean:        85.400 cm/s LVOT VTI:          0.225 m LVOT/AV VTI ratio: 0.69  AORTA Ao Root diam: 2.60 cm MITRAL VALVE MV Area (PHT): 3.77 cm    SHUNTS MV Area VTI:   3.17 cm     Systemic VTI:  0.22 m MV Peak grad:  2.4 mmHg    Systemic Diam: 2.00 cm MV Mean grad:  1.0 mmHg MV Vmax:       0.78 m/s MV Vmean:      48.3 cm/s MV Decel Time: 201 msec MV E velocity: 69.80 cm/s MV A velocity: 55.30 cm/s MV E/A ratio:  1.26 Serafina Royals MD Electronically signed by Serafina Royals MD Signature Date/Time: 08/12/2021/9:08:09 AM    Final    CT Renal Stone Study  Result Date: 08/10/2021 CLINICAL DATA:  Flank pain hematuria EXAM: CT ABDOMEN AND PELVIS WITHOUT CONTRAST TECHNIQUE: Multidetector CT imaging of the abdomen and pelvis was performed following the standard protocol without IV contrast. RADIATION DOSE REDUCTION: This exam was performed according to the departmental dose-optimization program which includes automated exposure control, adjustment of the mA and/or kV according to patient size and/or use of iterative reconstruction technique. COMPARISON:  CT 05/01/2021 FINDINGS: Lower chest: Lung bases demonstrate no acute consolidation or pleural effusion. Partially visualized intracardiac pacing leads and aortic valve prosthesis. Cardiomegaly. Hepatobiliary: No focal liver abnormality is seen. No gallstones, gallbladder wall thickening, or biliary dilatation. Pancreas: Unremarkable. No pancreatic ductal dilatation or surrounding inflammatory changes. Spleen: Normal in size without focal abnormality. Adrenals/Urinary Tract: Right adrenal gland is normal. Stable 15 mm left adrenal gland nodule, probable adenoma. Assessment of the renal collecting systems is limited due to excreted contrast within the collecting systems. Multiple low-density lesions most likely cysts. Duplicated collecting system on the right with fusion of the proximal duplicated ureters  to form a single common ureter. Bladder evaluation limited due to dense contrast. No hydronephrosis. Stomach/Bowel: The stomach is nonenlarged. No dilated small bowel. No acute bowel wall thickening. Vascular/Lymphatic: Advanced aortic  atherosclerosis. Aorto bi-iliac stent, on the left graft material extends into the left proximal internal iliac artery. Grossly stable residual sac diameter at the left common iliac artery measuring up to 3.8 cm. Grossly stable 5 cm probable aneurysm in the region of right internal iliac artery, appears excluded by the stent. Embolization in the right posterior pelvic sidewall. No grossly enlarged lymph nodes. Reproductive: Prostate is enlarged. Other: Negative for pelvic effusion or free air. Musculoskeletal: Degenerative changes.  No acute osseous abnormality IMPRESSION: 1. Assessment of the renal collecting systems and bladder is limited due to the presence of excreted contrast which obscures the known stone disease. There is no gross hydronephrosis. 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 2. No definite CT evidence for acute intra-abdominal or pelvic abnormality 3. Status post aortal bi-iliac stent graft. Right pelvic sidewall embolization coils with grossly stable suspected 5 cm aneurysm in the region of right internal iliac artery which may be excluded by the iliac stent. Nonemergent CTA follow-up previously suggested. Electronically Signed   By: Donavan Foil M.D.   On: 08/10/2021 21:29    ECHO 06/2021 EF 55 % 40mmHg mean grad across the prosthetic aortic valve, 2.4 m/sec pk velocity, DI 0.53.   TELEMETRY reviewed by me: NSR rate 77  EKG reviewed by me: NSR rate 94 and LVH  ASSESSMENT AND PLAN:  65yoM with a PMH significant for CAD s/p CABG x5 & PCI 12/2019, STEMI in 1062 complicated by V Fib arrest, severe AS s/p TAVR 04/2020, 2* AV block s/p PPM placement 05/2020, paroxysmal atrial fibrillation, HTN, HLD, bilateral iliac artery aneurysms who presented to [facility] ED 08/10/21 after a suspected syncopal episode. He had Cardiology is consulted to evaluate for a possible cardiac cause of his syncope.   #?syncope versus orthostasis  #2* AV block s/p medtronic PPM placement 05/2020 #elevated  troponin, likely demand ischemia The patient had 1 episode of feeling dizzy, weak, and subsequently passing out for 2 to 3 minutes while slumped in a chair per his wife the morning of 1/16.  This episode was in the setting of taking a new medication, tamsulosin and 2 hours later taking the remainder of his blood pressure medications which resulted in a blood pressure of 105/67 per his wife - while his baseline is usually 694 systolic.  Additionally, the patient has not been feeling at his baseline over the past week, including not eating well, sleeping well, and feeling generally weaker than usual. -troponin 117-99. Patient is without chest pain or sensations in his arms similar to his previous heart attacks. This likely represents demand ischemia from hypotension. -medtronic pacemaker interrogation performed at bedside by me did not reveal any transmissions on 1/16 when the patient had his syncopal episode.  -Echocardiogram resulted LVEF 85-46%, grade 1 diastolic dysfunction, mild MR, trivial AR normal function of bioprosthetic AV -CT head without acute intracranial process -monitor on telemetry while inpatient  -orthostatic vitals performed yesterday revealed appropriate increase in blood pressure with standing.  Lying 145/69, sitting 169/70, standing 170/100. -recommend giving patient flomax in the evening moving forward, rather than with his other morning antihypertensives -No further cardiac diagnostics at this time. He will need follow up with his regular cardiologist [name] in 1-2 weeks.   #UTI  #urinary frequency, s/p lithotripsy & stent 1/9 -  On empiric antibiotics  -mgmt per primary team   #paroxysmal atrial fibrillation on eliquis  -remains in NSR -continue medications as below  -CHADS2VASC 3  #CAD s/p CABG x5 & PCI 12/2019, STEMI in 8811 complicated by V Fib arrest #severe AS s/p TAVR 04/2020 #HTN #hyperlipidemia Home medications include amlodipine 10mg  once daily, losartan  100mg  once dialy, metoprolol 25mg  twice daily, and crestor 40mg  once daily, aspirin 81mg  and eliquis 5mg  BID  #hypokalemia replete and monitor  Cardiology signing off at this time.  Please contact myself or the on-call physician Dr. Nehemiah Massed with further questions throughout the week.  This patient's plan of care was discussed and created with Dr. Serafina Royals and he is in agreement.  Signed: Tristan Schroeder , PA-C 08/12/2021, 10:28 AM

## 2021-08-12 NOTE — Discharge Instructions (Signed)
Please take your tamsulosin (also called flomax) at night time (at dinner or before bed). Do not take it at the same time as your other blood pressure medications. Take your blood pressure medications (amlodipine, losartan, metoprolol) in the morning.

## 2021-08-12 NOTE — Discharge Summary (Signed)
Physician Discharge Summary  Joseph Schmitt YTK:[id number] DOB: 26-Feb-1956 DOA: 08/10/2021  PCP: Latanya Maudlin, NP  Admit date: 08/10/2021 Discharge date: 08/12/2021 Admitted From: Home Disposition: Home Recommendations for Outpatient Follow-up:  Follow ups as below. Patient has upcoming appointment with urology and sleep clinic. Please obtain CBC/BMP/Mag at follow up Please follow up on the following pending results: None Home Health: Not indicated Equipment/Devices: Not indicated Discharge Condition: Stable CODE STATUS: Full code  Follow-up Information     [name], NP. Go in 1 week(s).   Specialty: Cardiology Why: 1-2 weeks after discharge Contact information: [place] [address] [phone]         Latanya Maudlin, NP. Schedule an appointment as soon as possible for a visit in 1 week(s).   Specialty: Family Medicine Contact information: [place] [address] [phone]                Hospital Course: 66 year old male with PMH of CAD/CABG, V. fib arrest, CAD, aortic stenosis status post TAVR, diastolic CHF, HTN, HLD and nephrolithiasis status post recent ureteral stent and lithotripsy about a week ago presenting with generalized weakness, poor p.o. intake, dysuria, frequency, urgency and syncopal episode.  Patient pulled out his stent as advised by his urologist office prior to arrival.  He is admitted for sepsis due to E. coli UTI, syncope, AKI and elevated troponin.  He was hypotensive to 82/68 with respiratory rate of 30.  Lactic acid 1.2.  Patient was started on IV ceftriaxone and IV fluid.  Cardiology consulted.  Patient urine culture grew pansensitive E. coli except to ampicillin and Unasyn.  He is discharged on cefadroxil 1000 mg twice daily for 7 more days to treat for complicated UTI.  In regards to syncope, work-up including TTE, cortisol level and orthostatic vitals were negative.  No events on  telemetry.  Syncope felt to be due to sepsis in the setting of complicated UTI and multiple antihypertensive meds.  He is cleared for discharge by cardiology for outpatient follow-up.  Advised to take his Flomax at night.   See individual problem list below for more on hospital course.  Discharge Diagnoses:  Severe sepsis due to complicated E. coli UTI and patient with recent ureteral stent and lithotripsy: POA.  Met criteria with tachycardia, leukocytosis, AKI and hypotension with SBP down to 82 on arrival.  Sepsis physiology resolved.  Urine culture with pansensitive E. coli except ampicillin and Unasyn. -Received IV ceftriaxone x3 doses and discharged on p.o. cefadroxil 1000 mg twice daily for 7 more days -Patient has upcoming appointment with urology  Syncopal episode: Likely due to the above and multiple antihypertensive meds.  He is also diabetic.  At risk for autonomic dysregulation.  Orthostatic vitals negative.  TTE reassuring.  No event on telemetry. -Outpatient follow-up with cardiology  Elevated troponin: Likely demand ischemia in the setting of sepsis and hypotension.  Some element of delayed clearance due to AKI as well.  TTE reassuring.  No further work-up indicated per cardiology  History of NSTEMI/CAD/CABG in 2020 and LHC in 2017 -Continue home cardiac meds and a statin.  History of second-degree AVB s/p PPM Paroxysmal A. Fib -Continue metoprolol and Eliquis  AKI/azotemia: Likely due to sepsis, hypotension.[name]  Resolving. Recent Labs    02/05/21 0422 05/01/21 1523 08/10/21 1648 08/11/21 1015 08/12/21 0553  BUN 25*  --  27* 25* 20  CREATININE 1.04 1.20 1.65* 1.29* 1.27*  -Recheck renal function in 1 to  2 weeks  Abnormal gait/shuffling gait with associated tremor -Recommend ambulatory referral to neurology for evaluation  Hypokalemia: Resolved.  At risk for sleep apnea -Patient reports upcoming appointment at sleep clinic for sleep study.  Body mass index is  32.46 kg/m.           Discharge Exam: Vitals:   08/12/21 0717 08/12/21 0825 08/12/21 0949 08/12/21 1114  BP: (!) 156/79 140/76  129/62  Pulse: 76 82  66  Temp: 98.5 F (36.9 C) 99.2 F (37.3 C)  98.3 F (36.8 C)  Resp:   17 18  Height:      Weight:      SpO2: 96% 93%  98%  TempSrc:  Oral  Oral  BMI (Calculated):         GENERAL: No apparent distress.  Nontoxic. HEENT: MMM.  Vision and hearing grossly intact.  NECK: Supple.  No apparent JVD.  RESP: 98% on RA.  No IWOB.  Fair aeration bilaterally. CVS:  RRR. Heart sounds normal.  ABD/GI/GU: Bowel sounds present. Soft. Non tender.  MSK/EXT:  Moves extremities. No apparent deformity. No edema.  SKIN: no apparent skin lesion or wound NEURO: Awake and alert.  Oriented appropriately.  No apparent focal neuro deficit. PSYCH: Calm. Normal affect.   Discharge Instructions  Discharge Instructions     Call MD for:  difficulty breathing, headache or visual disturbances   Complete by: As directed    Call MD for:  extreme fatigue   Complete by: As directed    Call MD for:  persistant dizziness or light-headedness   Complete by: As directed    Diet - low sodium heart healthy   Complete by: As directed    Discharge instructions   Complete by: As directed    It has been a pleasure taking care of you!  You were hospitalized and treated for syncope (passing out), urinary tract infection and acute kidney injury.  Your syncope might have been caused by the infection and blood pressure medications.  Your kidney function has recovered.  We are discharging you on antibiotics to complete treatment course for urinary tract infection.  Please review your new medication list and the directions on your medications before you take them.  Please follow-up with your primary care doctor in 1 to 2 weeks or sooner if needed.  Follow-up with urologist as previously planned   Take care,   Increase activity slowly   Complete by: As directed        Allergies as of 08/12/2021   No Known Allergies      Medication List     STOP taking these medications    oxybutynin 5 MG tablet Commonly known as: DITROPAN       TAKE these medications    amLODipine 10 MG tablet Commonly known as: NORVASC Take 10 mg by mouth daily.   aspirin 81 MG chewable tablet Chew 1 tablet (81 mg total) by mouth daily.   cefadroxil 500 MG capsule Commonly known as: DURICEF Take 2 capsules (1,000 mg total) by mouth 2 (two) times daily for 7 days.   cholecalciferol 25 MCG (1000 UNIT) tablet Commonly known as: VITAMIN D3 Take 1,000 Units by mouth daily.   cyanocobalamin 1000 MCG tablet Take 1,000 mcg by mouth daily.   Eliquis 5 MG Tabs tablet Generic drug: apixaban Take 5 mg by mouth 2 (two) times daily.   HYDROcodone-acetaminophen 5-325 MG tablet Commonly known as: NORCO/VICODIN Take 1-2 tablets by mouth every 6 (six) hours  as needed for moderate pain.   losartan 100 MG tablet Commonly known as: COZAAR Take 100 mg by mouth daily.   metoprolol tartrate 25 MG tablet Commonly known as: LOPRESSOR Take 25 mg by mouth 2 (two) times daily.   rosuvastatin 40 MG tablet Commonly known as: CRESTOR Take 40 mg by mouth daily.   tamsulosin 0.4 MG Caps capsule Commonly known as: Flomax Take 1 capsule (0.4 mg total) by mouth daily after supper. What changed: when to take this        Consultations: Cardiology  Procedures/Studies:   CT HEAD WO CONTRAST (5MM)  Result Date: 08/11/2021 CLINICAL DATA:  Syncope EXAM: CT HEAD WITHOUT CONTRAST TECHNIQUE: Contiguous axial images were obtained from the base of the skull through the vertex without intravenous contrast. RADIATION DOSE REDUCTION: This exam was performed according to the departmental dose-optimization program which includes automated exposure control, adjustment of the mA and/or kV according to patient size and/or use of iterative reconstruction technique. COMPARISON:  None.  FINDINGS: Brain: No acute intracranial hemorrhage, mass effect, or herniation. No extra-axial fluid collections. No evidence of acute territorial infarct. No hydrocephalus. Mild cortical volume loss. Patchy hypodensities in the periventricular and subcortical white matter, likely secondary to chronic microvascular ischemic changes. Vascular: Calcified plaques in the carotid siphons. Skull: Normal. Negative for fracture or focal lesion. Sinuses/Orbits: No acute finding. Other: None. IMPRESSION: Chronic changes with no acute intracranial process identified. Electronically Signed   By: Ofilia Neas M.D.   On: 08/11/2021 08:59   CT Angio Chest PE W and/or Wo Contrast  Result Date: 08/10/2021 CLINICAL DATA:  Dizzy lightheaded EXAM: CT ANGIOGRAPHY CHEST WITH CONTRAST TECHNIQUE: Multidetector CT imaging of the chest was performed using the standard protocol during bolus administration of intravenous contrast. Multiplanar CT image reconstructions and MIPs were obtained to evaluate the vascular anatomy. RADIATION DOSE REDUCTION: This exam was performed according to the departmental dose-optimization program which includes automated exposure control, adjustment of the mA and/or kV according to patient size and/or use of iterative reconstruction technique. CONTRAST:  93m OMNIPAQUE IOHEXOL 350 MG/ML SOLN COMPARISON:  Chest x-ray 08/10/2021 FINDINGS: Cardiovascular: Slightly suboptimal opacification of the pulmonary arteries. No acute pulmonary embolus is seen. Post CABG changes. Aortic valve replacement. Coronary vascular calcifications. Partially visualized intracardiac pacing leads. Borderline cardiomegaly. No pericardial effusion Mediastinum/Nodes: Midline trachea. No thyroid mass. No suspicious lymph nodes. Esophagus within normal limits Lungs/Pleura: Lungs are clear. No pleural effusion or pneumothorax. Upper Abdomen: No acute abnormality. Musculoskeletal: Post sternotomy changes. No acute osseous abnormality  Review of the MIP images confirms the above findings. IMPRESSION: 1. Negative for acute pulmonary embolus. 2. Clear lung fields Aortic Atherosclerosis (ICD10-I70.0). Electronically Signed   By: KDonavan FoilM.D.   On: 08/10/2021 20:20   UKoreaCarotid Bilateral  Result Date: 08/11/2021 CLINICAL DATA:  Syncope Hypertension Hyperlipidemia EXAM: BILATERAL CAROTID DUPLEX ULTRASOUND TECHNIQUE: GPearline Cablesscale imaging, color Doppler and duplex ultrasound were performed of bilateral carotid and vertebral arteries in the neck. COMPARISON:  None. FINDINGS: Criteria: Quantification of carotid stenosis is based on velocity parameters that correlate the residual internal carotid diameter with NASCET-based stenosis levels, using the diameter of the distal internal carotid lumen as the denominator for stenosis measurement. The following velocity measurements were obtained: RIGHT ICA: 82/18 cm/sec CCA: 812/19cm/sec SYSTOLIC ICA/CCA RATIO:  1.6 ECA: 142 cm/sec LEFT ICA: 51/16 cm/sec CCA: 1758/8cm/sec SYSTOLIC ICA/CCA RATIO:  0.8 ECA: 93 cm/sec RIGHT CAROTID ARTERY: Mild focal calcified plaque noted at the carotid bifurcation. RIGHT VERTEBRAL  ARTERY:  Antegrade flow. LEFT CAROTID ARTERY: Mild atheromatous plaque of the carotid bifurcation. LEFT VERTEBRAL ARTERY:  Antegrade flow. IMPRESSION: Less than 50% stenosis of the internal carotid arteries. Electronically Signed   By: Miachel Roux M.D.   On: 08/11/2021 08:13   DG Chest Portable 1 View  Result Date: 08/10/2021 CLINICAL DATA:  Near syncope, short of breath EXAM: PORTABLE CHEST 1 VIEW COMPARISON:  03/15/2016 FINDINGS: Two frontal views of the chest demonstrate stable postsurgical changes from CABG. Dual lead pacer overlies right chest, proximal lead over right atrium and distal lead over right ventricle. Evidence of aortic valve replacement. Cardiac silhouette is mildly enlarged, likely projectional. No airspace disease, effusion, or pneumothorax. No acute bony abnormalities.  IMPRESSION: 1. No acute intrathoracic process. Electronically Signed   By: Randa Ngo M.D.   On: 08/10/2021 18:50   DG OR UROLOGY CYSTO IMAGE ([place])  Result Date: 08/03/2021 There is no interpretation for this exam.  This order is for images obtained during a surgical procedure.  Please See "Surgeries" Tab for more information regarding the procedure.   ECHOCARDIOGRAM COMPLETE  Result Date: 08/12/2021    ECHOCARDIOGRAM REPORT   Patient Name:   Joseph Schmitt Date of Exam: 08/11/2021 Medical Rec #:  [id number]          Height:       71.0 in Accession #:    [id number]         Weight:       258.0 lb Date of Birth:  11/11/1955          BSA:          2.350 m Patient Age:    66 years           BP:           140/99 mmHg Patient Gender: M                  HR:           773 bpm. Exam Location:  [facility] Procedure: 2D Echo, Color Doppler, Cardiac Doppler and Intracardiac            Opacification Agent Indications:     Elevated troponin  History:         Patient has prior history of Echocardiogram examinations. CAD,                  Pacemaker and Prior CABG, TAVR 2021; Risk Factors:Hypertension                  and HCL.  Sonographer:     Charmayne Sheer Referring Phys:  [id number] [place] Diagnosing Phys: Serafina Royals MD  Sonographer Comments: Technically difficult study due to poor echo windows. Image acquisition challenging due to patient body habitus. IMPRESSIONS  1. Left ventricular ejection fraction, by estimation, is 55 to 60%. The left ventricle has normal function. The left ventricle has no regional wall motion abnormalities. Left ventricular diastolic parameters are consistent with Grade I diastolic dysfunction (impaired relaxation).  2. Right ventricular systolic function is normal. The right ventricular size is normal.  3. The mitral valve is normal in structure. Mild mitral valve regurgitation.  4. The aortic valve is calcified. Aortic valve regurgitation is trivial. Echo findings are consistent with  normal structure and function of the aortic valve prosthesis. FINDINGS  Left Ventricle: Left ventricular ejection fraction, by estimation, is 55 to 60%. The left ventricle has normal function. The left ventricle has no  regional wall motion abnormalities. Definity contrast agent was given IV to delineate the left ventricular  endocardial borders. The left ventricular internal cavity size was normal in size. There is no left ventricular hypertrophy. Left ventricular diastolic parameters are consistent with Grade I diastolic dysfunction (impaired relaxation). Right Ventricle: The right ventricular size is normal. No increase in right ventricular wall thickness. Right ventricular systolic function is normal. Left Atrium: Left atrial size was normal in size. Right Atrium: Right atrial size was normal in size. Pericardium: There is no evidence of pericardial effusion. Mitral Valve: The mitral valve is normal in structure. Mild mitral valve regurgitation. MV peak gradient, 2.4 mmHg. The mean mitral valve gradient is 1.0 mmHg. Tricuspid Valve: The tricuspid valve is normal in structure. Tricuspid valve regurgitation is mild. Aortic Valve: The aortic valve is calcified. Aortic valve regurgitation is trivial. Aortic valve mean gradient measures 7.0 mmHg. Aortic valve peak gradient measures 16.2 mmHg. Aortic valve area, by VTI measures 2.17 cm. There is a bioprosthetic valve present in the aortic position. Echo findings are consistent with normal structure and function of the aortic valve prosthesis. Pulmonic Valve: The pulmonic valve was normal in structure. Pulmonic valve regurgitation is not visualized. Aorta: The aortic root and ascending aorta are structurally normal, with no evidence of dilitation. IAS/Shunts: No atrial level shunt detected by color flow Doppler.  LEFT VENTRICLE PLAX 2D LVIDd:         5.29 cm   Diastology LVIDs:         3.51 cm   LV e' medial:    6.09 cm/s LV PW:         1.32 cm   LV E/e' medial:  11.5  LV IVS:        1.04 cm   LV e' lateral:   8.59 cm/s LVOT diam:     2.00 cm   LV E/e' lateral: 8.1 LV SV:         71 LV SV Index:   30 LVOT Area:     3.14 cm  RIGHT VENTRICLE RV Basal diam:  3.69 cm LEFT ATRIUM             Index        RIGHT ATRIUM           Index LA diam:        4.50 cm 1.92 cm/m   RA Area:     16.30 cm LA Vol (A2C):   97.6 ml 41.54 ml/m  RA Volume:   39.90 ml  16.98 ml/m LA Vol (A4C):   44.0 ml 18.73 ml/m LA Biplane Vol: 65.8 ml 28.01 ml/m  AORTIC VALVE                     PULMONIC VALVE AV Area (Vmax):    2.14 cm      PV Vmax:       1.08 m/s AV Area (Vmean):   2.20 cm      PV Vmean:      70.000 cm/s AV Area (VTI):     2.17 cm      PV VTI:        0.155 m AV Vmax:           201.00 cm/s   PV Peak grad:  4.7 mmHg AV Vmean:          122.000 cm/s  PV Mean grad:  2.0 mmHg AV VTI:  0.326 m AV Peak Grad:      16.2 mmHg AV Mean Grad:      7.0 mmHg LVOT Vmax:         137.00 cm/s LVOT Vmean:        85.400 cm/s LVOT VTI:          0.225 m LVOT/AV VTI ratio: 0.69  AORTA Ao Root diam: 2.60 cm MITRAL VALVE MV Area (PHT): 3.77 cm    SHUNTS MV Area VTI:   3.17 cm    Systemic VTI:  0.22 m MV Peak grad:  2.4 mmHg    Systemic Diam: 2.00 cm MV Mean grad:  1.0 mmHg MV Vmax:       0.78 m/s MV Vmean:      48.3 cm/s MV Decel Time: 201 msec MV E velocity: 69.80 cm/s MV A velocity: 55.30 cm/s MV E/A ratio:  1.26 Serafina Royals MD Electronically signed by Serafina Royals MD Signature Date/Time: 08/12/2021/9:08:09 AM    Final    CT Renal Stone Study  Result Date: 08/10/2021 CLINICAL DATA:  Flank pain hematuria EXAM: CT ABDOMEN AND PELVIS WITHOUT CONTRAST TECHNIQUE: Multidetector CT imaging of the abdomen and pelvis was performed following the standard protocol without IV contrast. RADIATION DOSE REDUCTION: This exam was performed according to the departmental dose-optimization program which includes automated exposure control, adjustment of the mA and/or kV according to patient size and/or use of  iterative reconstruction technique. COMPARISON:  CT 05/01/2021 FINDINGS: Lower chest: Lung bases demonstrate no acute consolidation or pleural effusion. Partially visualized intracardiac pacing leads and aortic valve prosthesis. Cardiomegaly. Hepatobiliary: No focal liver abnormality is seen. No gallstones, gallbladder wall thickening, or biliary dilatation. Pancreas: Unremarkable. No pancreatic ductal dilatation or surrounding inflammatory changes. Spleen: Normal in size without focal abnormality. Adrenals/Urinary Tract: Right adrenal gland is normal. Stable 15 mm left adrenal gland nodule, probable adenoma. Assessment of the renal collecting systems is limited due to excreted contrast within the collecting systems. Multiple low-density lesions most likely cysts. 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 Bladder evaluation limited due to dense contrast. No hydronephrosis. Stomach/Bowel: The stomach is nonenlarged. No dilated small bowel. No acute bowel wall thickening. Vascular/Lymphatic: Advanced aortic atherosclerosis. Aorto bi-iliac stent, on the left graft material extends into the left proximal internal iliac artery. Grossly stable residual sac diameter at the left common iliac artery measuring up to 3.8 cm. Grossly stable 5 cm probable aneurysm in the region of right internal iliac artery, appears excluded by the stent. Embolization in the right posterior pelvic sidewall. No grossly enlarged lymph nodes. Reproductive: Prostate is enlarged. Other: Negative for pelvic effusion or free air. Musculoskeletal: Degenerative changes.  No acute osseous abnormality IMPRESSION: 1. Assessment of the renal collecting systems and bladder is limited due to the presence of excreted contrast which obscures the known stone disease. There is no gross hydronephrosis. 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 2. No definite CT evidence for  acute intra-abdominal or pelvic abnormality 3. Status post aortal bi-iliac stent graft. Right pelvic sidewall embolization coils with grossly stable suspected 5 cm aneurysm in the region of right internal iliac artery which may be excluded by the iliac stent. Nonemergent CTA follow-up previously suggested. Electronically Signed   By: Donavan Foil M.D.   On: 08/10/2021 21:29       The results of significant diagnostics from this hospitalization (including imaging, microbiology, ancillary and laboratory) are listed below  for reference.     Microbiology: Recent Results (from the past 240 hour(s))  Blood culture (routine x 2)     Status: None (Preliminary result)   Collection Time: 08/10/21  7:10 PM   Specimen: BLOOD  Result Value Ref Range Status   Specimen Description BLOOD LEFT ANTECUBITAL  Final   Special Requests   Final    BOTTLES DRAWN AEROBIC AND ANAEROBIC Blood Culture adequate volume   Culture   Final    NO GROWTH 2 DAYS Performed at [facility], [address] [place], [place] [address]    Report Status PENDING  Incomplete  Urine Culture     Status: Abnormal   Collection Time: 08/10/21  7:10 PM   Specimen: Urine, Clean Catch  Result Value Ref Range Status   Specimen Description   Final    URINE, CLEAN CATCH Performed at [facility], [address], [place], [place] [address]    Special Requests   Final    NONE Performed at [facility], [address], [place], [place] [address]    Culture >=100,000 COLONIES/mL ESCHERICHIA COLI (A)  Final   Report Status 08/12/2021 FINAL  Final   Organism ID, Bacteria ESCHERICHIA COLI (A)  Final      Susceptibility   Escherichia coli - MIC*    AMPICILLIN >=32 RESISTANT Resistant     CEFAZOLIN <=4 SENSITIVE Sensitive     CEFEPIME <=0.12 SENSITIVE Sensitive     CEFTRIAXONE <=0.25 SENSITIVE Sensitive     CIPROFLOXACIN <=0.25 SENSITIVE Sensitive     GENTAMICIN <=1 SENSITIVE Sensitive     IMIPENEM  <=0.25 SENSITIVE Sensitive     NITROFURANTOIN <=16 SENSITIVE Sensitive     TRIMETH/SULFA <=20 SENSITIVE Sensitive     AMPICILLIN/SULBACTAM >=32 RESISTANT Resistant     PIP/TAZO <=4 SENSITIVE Sensitive     * >=100,000 COLONIES/mL ESCHERICHIA COLI  Blood culture (routine x 2)     Status: None (Preliminary result)   Collection Time: 08/10/21  7:43 PM   Specimen: BLOOD  Result Value Ref Range Status   Specimen Description BLOOD RAC  Final   Special Requests BOTTLES DRAWN AEROBIC AND ANAEROBIC BCAV  Final   Culture   Final    NO GROWTH 2 DAYS Performed at [facility], [address], [place], [place] [address]    Report Status PENDING  Incomplete  Resp Panel by RT-PCR (Flu A&B, Covid) Nasopharyngeal Swab     Status: None   Collection Time: 08/10/21  7:43 PM   Specimen: Nasopharyngeal Swab; Nasopharyngeal(NP) swabs in vial transport medium  Result Value Ref Range Status   SARS Coronavirus 2 by RT PCR NEGATIVE NEGATIVE Final    Comment: (NOTE) SARS-CoV-2 target nucleic acids are NOT DETECTED.  The SARS-CoV-2 RNA is generally detectable in upper respiratory specimens during the acute phase of infection. The lowest concentration of SARS-CoV-2 viral copies this assay can detect is 138 copies/mL. A negative result does not preclude SARS-Cov-2 infection and should not be used as the sole basis for treatment or other patient management decisions. A negative result may occur with  improper specimen collection/handling, submission of specimen other than nasopharyngeal swab, presence of viral mutation(s) within the areas targeted by this assay, and inadequate number of viral copies(<138 copies/mL). A negative result must be combined with clinical observations, patient history, and epidemiological information. The expected result is Negative.  Fact Sheet for Patients:  EntrepreneurPulse.com.au  Fact Sheet for Healthcare Providers:   IncredibleEmployment.be  This test  is no t yet approved or cleared by the [place] and  has been authorized for detection and/or diagnosis of SARS-CoV-2 by FDA under an Emergency Use Authorization (EUA). This EUA will remain  in effect (meaning this test can be used) for the duration of the COVID-19 declaration under Section 564(b)(1) of the Act, 21 U.S.C.section 360bbb-3(b)(1), unless the authorization is terminated  or revoked sooner.       Influenza A by PCR NEGATIVE NEGATIVE Final   Influenza B by PCR NEGATIVE NEGATIVE Final    Comment: (NOTE) The Xpert Xpress SARS-CoV-2/FLU/RSV plus assay is intended as an aid in the diagnosis of influenza from Nasopharyngeal swab specimens and should not be used as a sole basis for treatment. Nasal washings and aspirates are unacceptable for Xpert Xpress SARS-CoV-2/FLU/RSV testing.  Fact Sheet for Patients: EntrepreneurPulse.com.au  Fact Sheet for Healthcare Providers: IncredibleEmployment.be  This test is not yet approved or cleared by the [place] FDA and has been authorized for detection and/or diagnosis of SARS-CoV-2 by FDA under an Emergency Use Authorization (EUA). This EUA will remain in effect (meaning this test can be used) for the duration of the COVID-19 declaration under Section 564(b)(1) of the Act, 21 U.S.C. section 360bbb-3(b)(1), unless the authorization is terminated or revoked.  Performed at [facility], [address] [place], [place] [address]      Labs:  CBC: Recent Labs  Lab 08/10/21 1648 08/11/21 1015 08/12/21 0553  WBC 12.3* 8.2 7.5  NEUTROABS 10.0*  --   --   HGB 12.1* 11.0* 10.9*  HCT 36.2* 33.3* 32.7*  MCV 85.8 86.0 85.6  PLT 191 170 183   BMP &GFR Recent Labs  Lab 08/10/21 1648 08/11/21 1015 08/12/21 0553  NA 136 133* 135  K 3.5 3.3* 3.7  CL 102 102 104  CO2 25 26 24   GLUCOSE 129* 137* 107*  BUN 27* 25*  20  CREATININE 1.65* 1.29* 1.27*  CALCIUM 8.9 8.5* 8.9   Estimated Creatinine Clearance: 70.7 mL/min (A) (by C-G formula based on SCr of 1.27 mg/dL (H)). Liver & Pancreas: Recent Labs  Lab 08/10/21 1648  AST 36  ALT 35  ALKPHOS 76  BILITOT 1.1  PROT 7.5  ALBUMIN 3.3*   No results for input(s): LIPASE, AMYLASE in the last 168 hours. No results for input(s): AMMONIA in the last 168 hours. Diabetic: No results for input(s): HGBA1C in the last 72 hours. No results for input(s): GLUCAP in the last 168 hours. Cardiac Enzymes: No results for input(s): CKTOTAL, CKMB, CKMBINDEX, TROPONINI in the last 168 hours. No results for input(s): PROBNP in the last 8760 hours. Coagulation Profile: Recent Labs  Lab 08/11/21 1015  INR 1.4*   Thyroid Function Tests: No results for input(s): TSH, T4TOTAL, FREET4, T3FREE, THYROIDAB in the last 72 hours. Lipid Profile: No results for input(s): CHOL, HDL, LDLCALC, TRIG, CHOLHDL, LDLDIRECT in the last 72 hours. Anemia Panel: No results for input(s): VITAMINB12, FOLATE, FERRITIN, TIBC, IRON, RETICCTPCT in the last 72 hours. Urine analysis:    Component Value Date/Time   COLORURINE AMBER (A) 08/10/2021 1910   APPEARANCEUR TURBID (A) 08/10/2021 1910   APPEARANCEUR Cloudy (A) 04/14/2021 1522   LABSPEC 1.015 08/10/2021 1910   PHURINE 5.0 08/10/2021 1910   GLUCOSEU NEGATIVE 08/10/2021 1910   HGBUR LARGE (A) 08/10/2021 1910   BILIRUBINUR NEGATIVE 08/10/2021 1910   BILIRUBINUR Negative 04/14/2021 1522   KETONESUR NEGATIVE 08/10/2021 1910   PROTEINUR 100 (A) 08/10/2021 1910   NITRITE NEGATIVE  08/10/2021 1910   LEUKOCYTESUR LARGE (A) 08/10/2021 1910   Sepsis Labs: Invalid input(s): PROCALCITONIN, LACTICIDVEN   Time coordinating discharge: 55 minutes  SIGNED:  Mercy Riding, MD  [facility] 08/12/2021, 4:14 PM

## 2021-08-15 LAB — CULTURE, BLOOD (ROUTINE X 2)
Culture: NO GROWTH
Culture: NO GROWTH
Special Requests: ADEQUATE

## 2021-08-31 NOTE — Progress Notes (Incomplete)
08/31/21 8:13 AM   Joseph Schmitt 1955/07/28 [id number]  Referring provider:  [facility] [address] [place],  [place] [address] No chief complaint on file.    HPI: Joseph Schmitt is a 66 y.o.male with a personal history of left ureteral stone, kidney stones, and microscopic hematuria, who presents today for 1 month post-op follow-up with [facility].   He initially presented for hematuria work-up.  He was found to have multiple bilateral kidney stones including a bladder stone.  He underwent cystoscopy at which time the bladder stone was noted to have resolved.  He had persistent microscopic hematuria.    CT urogram on 05/04/2021 CT abdomen and pelvis revealed 8 x 5 x 10 mm stone at or just distal to the left UPJ without hydronephrosis or substantial secondary changes in the left kidney. Bilateral nonobstructing renal stones. Bilateral renal cysts.14 mm left adrenal adenoma.  He is s/p bilateral ureteroscopy, retrograde pyelogram, ureteral stent placement, and basket extraction of stone fragment on 08/03/2021. Intraoperative findings showed a 1 cm stone at the level of the iliacs treated via semirigid ureteroscopy on the left.  Additional upper tract stones also dusted on the left.  Bifid left ureter to the level of the mid ureter, able to treat and dust all upper moiety stones however difficulty entering lower pole system due to more narrow/delicate ureter thus the stones are left untreated.  Bilateral ureteral stent on tether.  Stones were sent for analysis and were consistent with 80% calcium oxalate monohydrate and 20% calcium oxalate dihydrate.   PMH: Past Medical History:  Diagnosis Date   2nd degree AV block    a.) s/p PPM placement 05/26/2020   Adrenal adenoma, left    a.) CT 05/01/2021: measured 86mm   Aortic atherosclerosis ([place])    Bilateral renal cysts 05/01/2021   Cardiac arrest with ventricular fibrillation ([place]) 09/23/2008   a.) in setting of acute  inferior STEMI; 2 episodes requiring a single defibrillation each time prior to ROSC.   Chlamydia    Coronary artery disease    Diastolic dysfunction    a.)  TTE 06/03/2021: EF >55%; severe LA enlargement; bioprosthetic AoV well-seated with a mean transvalvular gradient of 11 mmHg; G2DD.   Excessive daytime sleepiness    Heart murmur    History of kidney stones    History of transcatheter aortic valve replacement (TAVR) 05/14/2020   a.) 23 mm Edwards Sapien S3 Ultra bioprosthetic valve   Hypercholesteremia    Hypertension    Iliac artery aneurysm ([place]) 06/30/2020   a.) CTA 06/30/2020: RIGHT common iliac artery thrombsed and measured 3.8 cm, LEFT common iliac artery measure 4.6 cm, RIGHT internal iliac artery measure 4.2 cm; s/p endovascular stenting and repair 08/19/2020.   Long term current use of anticoagulant    a.) apixaban   Long term current use of antithrombotics/antiplatelets    a.) DAPT (ASA + clopidogrel)   NSTEMI (non-ST elevated myocardial infarction) ([place]) 03/15/2016   a.)  LHC 03/16/2016: 100% pRCA, 100% pLAD, 100% RI, 85% dLCx-1, 100% dLCx-2; LIMA-LAD and SVG-OM 3 bypass grafts patent.  60% stenosis of the SVG-RCA, 60% stenosis of the SVG-D1, and total occlusion with significant thrombus noted within the SVG-OM1 bypass graft.   PAF (paroxysmal atrial fibrillation) ([facility])    a.) CHA2DS2-VASc = 3 (age, HTN, previous MI). b.) Rate/rhythm maintained on daily metoprolol; carhnically anticoagulated with apixaban + ASA + clopidogrel   Presence of permanent cardiac pacemaker 05/26/2020   a.) Medtronic device  S/P CABG x 5 09/23/2008   a.) LIMA-LAD, SVG-D1, SVG-OM1, SVG-OM3, SVG-PDA   Severe aortic stenosis    a.) s/p TAVR 05/14/2020   ST elevation myocardial infarction (STEMI) of inferior wall ([place]) 09/23/2008   a.) VF arrest x 2; ROSC with defibrillation x 1 each time. LHC --> 70% pRCA, 30% oLM, 30% LM, 99% mLCx, 50% dLCx, 90% OM1, 20% OM3, 30% LPL1, 20% pLAD, 30% mLAD-1, 80%  mLAD-2, 90% D2; refer to CVTS. Underwent 5v CABG 09/23/2008.    Surgical History: Past Surgical History:  Procedure Laterality Date   APPENDECTOMY     CARDIAC CATHETERIZATION N/A 03/16/2016   Procedure: Coronary/ grafts  Angiography;  Surgeon: [name], MD;  Location: [facility];  Service: Cardiovascular;  Laterality: N/A;   CARDIAC CATHETERIZATION Left 07/26/2008   Procedure: CARDIAC CATHETERIZATION; Location: [facility]   COLONOSCOPY WITH PROPOFOL N/A 12/03/2019   Procedure: COLONOSCOPY WITH PROPOFOL;  Surgeon: Toledo, Benay Pike, MD;  Location: [facility];  Service: Gastroenterology;  Laterality: N/A;   CORONARY ARTERY BYPASS GRAFT N/A 09/23/2008   CORONARY STENT PLACEMENT  2021   CYSTOSCOPY/URETEROSCOPY/HOLMIUM LASER/STENT PLACEMENT Left 08/03/2021   Procedure: CYSTOSCOPY/URETEROSCOPY/HOLMIUM LASER/STENT PLACEMENT-LEFT (POSSIBLE RIGHT);  Surgeon: Hollice Espy, MD;  Location: [facility];  Service: Urology;  Laterality: Left;   INSERT / REPLACE / REMOVE PACEMAKER  05/26/2020   Medtronic   KNEE ARTHROSCOPY Right    TRANSCATHETER AORTIC VALVE REPLACEMENT, TRANSAORTIC N/A 05/14/2020    Home Medications:  Allergies as of 09/01/2021   No Known Allergies      Medication List        Accurate as of August 31, 2021  8:13 AM. If you have any questions, ask your nurse or doctor.          amLODipine 10 MG tablet Commonly known as: NORVASC Take 10 mg by mouth daily.   aspirin 81 MG chewable tablet Chew 1 tablet (81 mg total) by mouth daily.   cholecalciferol 25 MCG (1000 UNIT) tablet Commonly known as: VITAMIN D3 Take 1,000 Units by mouth daily.   cyanocobalamin 1000 MCG tablet Take 1,000 mcg by mouth daily.   Eliquis 5 MG Tabs tablet Generic drug: apixaban Take 5 mg by mouth 2 (two) times daily.   HYDROcodone-acetaminophen 5-325 MG tablet Commonly known as: NORCO/VICODIN Take 1-2 tablets by mouth every 6 (six) hours as needed for moderate pain.    losartan 100 MG tablet Commonly known as: COZAAR Take 100 mg by mouth daily.   metoprolol tartrate 25 MG tablet Commonly known as: LOPRESSOR Take 25 mg by mouth 2 (two) times daily.   rosuvastatin 40 MG tablet Commonly known as: CRESTOR Take 40 mg by mouth daily.   tamsulosin 0.4 MG Caps capsule Commonly known as: Flomax Take 1 capsule (0.4 mg total) by mouth daily after supper.        Allergies: No Known Allergies  Family History: Family History  Problem Relation Age of Onset   Hypertension Other     Social History:  reports that he has never smoked. He has never used smokeless tobacco. He reports that he does not drink alcohol and does not use drugs.   Physical Exam: There were no vitals taken for this visit.  Constitutional:  Alert and oriented, No acute distress. HEENT: [place] AT, moist mucus membranes.  Trachea midline, no masses. Cardiovascular: No clubbing, cyanosis, or edema. Respiratory: Normal respiratory effort, no increased work of breathing. Skin: No rashes, bruises or suspicious lesions. Neurologic: Grossly intact, no  focal deficits, moving all 4 extremities. Psychiatric: Normal mood and affect.  Laboratory Data:  Lab Results  Component Value Date   CREATININE 1.27 (H) 08/12/2021   Lab Results  Component Value Date   HGBA1C 5.7 03/15/2016    Urinalysis   Pertinent Imaging:    Assessment & Plan:     No follow-ups on file.  I,Kailey Littlejohn,acting as a Education administrator for Hollice Espy, MD.,have documented all relevant documentation on the behalf of Hollice Espy, MD,as directed by  Hollice Espy, MD while in the presence of Hollice Espy, [place] [address], [place], [place] [address] [phone]

## 2021-09-01 ENCOUNTER — Ambulatory Visit: Payer: 59 | Admitting: Urology

## 2021-09-03 ENCOUNTER — Ambulatory Visit
Admission: RE | Admit: 2021-09-03 | Discharge: 2021-09-03 | Disposition: A | Discharge status: Home / Self Care | Payer: Medicare Other | Source: Ambulatory Visit | Attending: Urology | Admitting: Urology

## 2021-09-03 ENCOUNTER — Other Ambulatory Visit: Payer: Self-pay

## 2021-09-03 DIAGNOSIS — N200 Calculus of kidney: Secondary | ICD-10-CM | POA: Insufficient documentation

## 2021-09-11 ENCOUNTER — Telehealth: Payer: Self-pay | Admitting: *Deleted

## 2021-09-11 NOTE — Telephone Encounter (Addendum)
Left VM to return call   ----- Message from Hollice Espy, MD sent at 09/11/2021 10:04 AM EST ----- Looks like he is postop got canceled and still needs to be rescheduled.  Please do so.  Hollice Espy, MD

## 2022-08-03 DIAGNOSIS — M174 Other bilateral secondary osteoarthritis of knee: Secondary | ICD-10-CM | POA: Diagnosis not present

## 2022-08-03 DIAGNOSIS — M1712 Unilateral primary osteoarthritis, left knee: Secondary | ICD-10-CM | POA: Diagnosis not present

## 2022-08-17 ENCOUNTER — Other Ambulatory Visit: Payer: Self-pay | Admitting: Orthopedic Surgery

## 2022-08-20 ENCOUNTER — Encounter
Admission: RE | Admit: 2022-08-20 | Discharge: 2022-08-20 | Disposition: A | Discharge status: Home / Self Care | Payer: Medicare HMO | Source: Ambulatory Visit | Attending: Orthopedic Surgery | Admitting: Orthopedic Surgery

## 2022-08-20 ENCOUNTER — Other Ambulatory Visit: Payer: Self-pay

## 2022-08-20 VITALS — BP 140/82 | HR 66 | Resp 16 | Ht 71.0 in | Wt 259.5 lb

## 2022-08-20 DIAGNOSIS — Z01818 Encounter for other preprocedural examination: Secondary | ICD-10-CM | POA: Diagnosis not present

## 2022-08-20 DIAGNOSIS — Z01810 Encounter for preprocedural cardiovascular examination: Secondary | ICD-10-CM | POA: Diagnosis not present

## 2022-08-20 DIAGNOSIS — Z01812 Encounter for preprocedural laboratory examination: Secondary | ICD-10-CM

## 2022-08-20 HISTORY — DX: Unspecified osteoarthritis, unspecified site: M19.90

## 2022-08-20 LAB — COMPREHENSIVE METABOLIC PANEL
ALT: 21 U/L (ref 0–44)
AST: 25 U/L (ref 15–41)
Albumin: 3.8 g/dL (ref 3.5–5.0)
Alkaline Phosphatase: 60 U/L (ref 38–126)
Anion gap: 9 (ref 5–15)
BUN: 25 mg/dL — ABNORMAL HIGH (ref 8–23)
CO2: 24 mmol/L (ref 22–32)
Calcium: 9.5 mg/dL (ref 8.9–10.3)
Chloride: 104 mmol/L (ref 98–111)
Creatinine, Ser: 1.14 mg/dL (ref 0.61–1.24)
GFR, Estimated: >60 mL/min (ref >60)
Glucose, Bld: 104 mg/dL — ABNORMAL HIGH (ref 70–99)
Potassium: 3.7 mmol/L (ref 3.5–5.1)
Sodium: 137 mmol/L (ref 135–145)
Total Bilirubin: 1 mg/dL (ref 0.3–1.2)
Total Protein: 7.2 g/dL (ref 6.5–8.1)

## 2022-08-20 LAB — URINALYSIS, ROUTINE W REFLEX MICROSCOPIC
Bacteria, UA: NONE SEEN
Bilirubin Urine: NEGATIVE
Glucose, UA: NEGATIVE mg/dL
Ketones, ur: NEGATIVE mg/dL
Leukocytes,Ua: NEGATIVE
Nitrite: NEGATIVE
Protein, ur: NEGATIVE mg/dL
Specific Gravity, Urine: 1.018 (ref 1.005–1.030)
pH: 6 (ref 5.0–8.0)

## 2022-08-20 LAB — CBC WITH DIFFERENTIAL/PLATELET
Abs Immature Granulocytes: 0.02 10*3/uL (ref 0.00–0.07)
Basophils Absolute: 0.1 10*3/uL (ref 0.0–0.1)
Basophils Relative: 1 %
Eosinophils Absolute: 0.3 10*3/uL (ref 0.0–0.5)
Eosinophils Relative: 5 %
HCT: 39.9 % (ref 39.0–52.0)
Hemoglobin: 13.5 g/dL (ref 13.0–17.0)
Immature Granulocytes: 0 %
Lymphocytes Relative: 31 %
Lymphs Abs: 2 10*3/uL (ref 0.7–4.0)
MCH: 30.3 pg (ref 26.0–34.0)
MCHC: 33.8 g/dL (ref 30.0–36.0)
MCV: 89.5 fL (ref 80.0–100.0)
Monocytes Absolute: 0.7 10*3/uL (ref 0.1–1.0)
Monocytes Relative: 10 %
Neutro Abs: 3.3 10*3/uL (ref 1.7–7.7)
Neutrophils Relative %: 53 %
Platelets: 151 10*3/uL (ref 150–400)
RBC: 4.46 MIL/uL (ref 4.22–5.81)
RDW: 12.8 % (ref 11.5–15.5)
WBC: 6.3 10*3/uL (ref 4.0–10.5)
nRBC: 0 % (ref 0.0–0.2)

## 2022-08-20 LAB — TYPE AND SCREEN
ABO/RH(D): O POS
Antibody Screen: NEGATIVE

## 2022-08-20 LAB — SURGICAL PCR SCREEN
MRSA, PCR: NEGATIVE
Staphylococcus aureus: NEGATIVE

## 2022-08-20 NOTE — Patient Instructions (Addendum)
Your procedure is scheduled on: 09/02/22 - Thursday Report to the Registration Desk on the 1st floor of the [facility]. To find out your arrival time, please call [phone] between 1PM - 3PM on: 09/01/22 - Wednesday If your arrival time is 6:00 am, do not arrive prior to that time as the [facility] entrance doors do not open until 6:00 am.  REMEMBER: Instructions that are not followed completely may result in serious medical risk, up to and including death; or upon the discretion of your surgeon and anesthesiologist your surgery may need to be rescheduled.  Do not eat food after midnight the night before surgery.  No gum chewing, lozengers or hard candies.  You may however, drink CLEAR liquids up to 2 hours before you are scheduled to arrive for your surgery. Do not drink anything within 2 hours of your scheduled arrival time.  Clear liquids include: - water  - apple juice without pulp - gatorade (not RED colors) - black coffee or tea (Do NOT add milk or creamers to the coffee or tea) Do NOT drink anything that is not on this list.  In addition, your doctor has ordered for you to drink the provided  Ensure Pre-Surgery Clear Carbohydrate Drink  Drinking this carbohydrate drink up to two hours before surgery helps to reduce insulin resistance and improve patient outcomes. Please complete drinking 2 hours prior to scheduled arrival time.  TAKE ONLY THESE MEDICATIONS THE MORNING OF SURGERY WITH A SIP OF WATER:  - amLODipine (NORVASC)  - metoprolol tartrate (LOPRESSOR)  - rosuvastatin (CRESTOR)   HOLD ELIQUIS 3 days prior to your surgery.  One week prior to surgery: Stop Anti-inflammatories (NSAIDS) such as Advil, Aleve, Ibuprofen, Motrin, Naproxen, Naprosyn and Aspirin based products such as Excedrin, Goodys Powder, BC Powder.  Stop ANY OVER THE COUNTER supplements until after surgery.  You may however, continue to take Tylenol if needed for pain up until the day of  surgery.  No Alcohol for 24 hours before or after surgery.  No Smoking including e-cigarettes for 24 hours prior to surgery.  No chewable tobacco products for at least 6 hours prior to surgery.  No nicotine patches on the day of surgery.  Do not use any "recreational" drugs for at least a week prior to your surgery.  Please be advised that the combination of cocaine and anesthesia may have negative outcomes, up to and including death. If you test positive for cocaine, your surgery will be cancelled.  On the morning of surgery brush your teeth with toothpaste and water, you may rinse your mouth with mouthwash if you wish. Do not swallow any toothpaste or mouthwash.  Use CHG Soap or wipes as directed on instruction sheet.  Do not wear jewelry, make-up, hairpins, clips or nail polish.  Do not wear lotions, powders, or perfumes.   Do not shave body from the neck down 48 hours prior to surgery just in case you cut yourself which could leave a site for infection.  Also, freshly shaved skin may become irritated if using the CHG soap.  Contact lenses, hearing aids and dentures may not be worn into surgery.  Do not bring valuables to the hospital. [facility] is not responsible for any missing/lost belongings or valuables.   Notify your doctor if there is any change in your medical condition (cold, fever, infection).  Wear comfortable clothing (specific to your surgery type) to the hospital.  After surgery, you can help prevent lung complications by doing  breathing exercises.  Take deep breaths and cough every 1-2 hours. Your doctor may order a device called an Incentive Spirometer to help you take deep breaths. When coughing or sneezing, hold a pillow firmly against your incision with both hands. This is called "splinting." Doing this helps protect your incision. It also decreases belly discomfort.  If you are being admitted to the hospital overnight, leave your suitcase in the car. After  surgery it may be brought to your room.  If you are being discharged the day of surgery, you will not be allowed to drive home. You will need a responsible adult (18 years or older) to drive you home and stay with you that night.   If you are taking public transportation, you will need to have a responsible adult (18 years or older) with you. Please confirm with your physician that it is acceptable to use public transportation.   Please call the [facility] at [phone] if you have any questions about these instructions.  Surgery Visitation Policy:  Patients undergoing a surgery or procedure may have two family members or support persons with them as long as the person is not COVID-19 positive or experiencing its symptoms.   Inpatient Visitation:    Visiting hours are 7 a.m. to 8 p.m. Up to four visitors are allowed at one time in a patient room. The visitors may rotate out with other people during the day. One designated support person (adult) may remain overnight.  Due to an increase in RSV and influenza rates and associated hospitalizations, children ages 99 and under will not be able to visit patients in [facility]. Masks continue to be strongly recommended.

## 2022-08-24 DIAGNOSIS — Z01818 Encounter for other preprocedural examination: Secondary | ICD-10-CM | POA: Diagnosis not present

## 2022-08-30 ENCOUNTER — Encounter: Payer: Self-pay | Admitting: Orthopedic Surgery

## 2022-08-30 NOTE — Progress Notes (Signed)
Perioperative Services  Pre-Admission/Anesthesia Testing Clinical Review  Date: 09/01/22  Patient Demographics:  Name: Joseph Schmitt DOB:   1956-07-06 MRN:   [id number]  Planned Surgical Procedure(s):    Case: [id number] Date/Time: 09/02/22 1027   Procedure: TOTAL KNEE ARTHROPLASTY (Left: Knee)   Anesthesia type: Choice   Pre-op diagnosis:      Osteoarthritis of left knee, unspecified osteoarthritis type M17.12     Other bilateral secondary osteoarthritis of knee M17.4   Location: [facility] OR ROOM 01 / [place] [facility]   Surgeons: Steffanie Rainwater, MD   NOTE: Available PAT nursing documentation and vital signs have been reviewed. Clinical nursing staff has updated patient's PMH/PSHx, current medication list, and drug allergies/intolerances to ensure comprehensive history available to assist in medical decision making as it pertains to the aforementioned surgical procedure and anticipated anesthetic course. Extensive review of available clinical information performed. [place] PMH and PSHx updated with any diagnoses/procedures that  may have been inadvertently omitted during his intake with the pre-admission testing department's nursing staff.  Clinical Discussion:  Joseph Schmitt is a 67 y.o. male who is submitted for pre-surgical anesthesia review and clearance prior to him undergoing the above procedure. Patient has never been a smoker. Pertinent PMH includes: CAD (s/p CABG), STEMI, NSTEMI, ventricular fibrillation arrest, second-degree AV block (s/p PPM placement), severe aortic stenosis (s/p TAVR), iliac artery aneurysms, paroxysmal atrial fibrillation, diastolic dysfunction, aortic atherosclerosis, HTN, HLD, nephrolithiasis, BPH,  OA, excessive daytime sleepiness.   Patient is followed by cardiology [name], MD). He was last seen in the cardiology clinic on 07/08/2022; notes reviewed.  At the time of his clinic visit, patient doing well overall from a  cardiovascular perspective. Patient denied any chest pain, shortness of breath, PND, orthopnea, palpitations, significant peripheral edema, weakness, fatigue, vertiginous symptoms, or presyncope/syncope. Patient with a past medical history significant for cardiovascular diagnoses. Documented physical exam was grossly benign, providing no evidence of acute exacerbation and/or decompensation of the patient's known cardiovascular conditions.  Patient suffered an inferior wall STEMI on 09/23/2008.  During the course of his cardiac event, patient went into ventricular fibrillation x 2, with each requiring a single defibrillation to restore ROSC.    Diagnostic left heart catheterization was performed revealing multivessel CAD; 70% proximal RCA, 30% ostial LM, 30% LM, 99% mid LCx (culprit lesion), 50% distal LCx, 90% OM1, 20% OM3, 30% LPL1, 20% proximal LAD, 30% mid LAD-1, 80% mid LAD-2, and 90% D2.  Patient was referred to CVTS for consideration of CABG procedure.    Patient ultimately underwent 5 vessel CABG on 09/23/2008.  LIMA-LAD, SVG-D1, SVG-OM1, SVG-OM3, and SVG-PDA bypass grafts were placed.  Patient suffered an NSTEMI on 03/16/2016.  Diagnostic left heart catheterization was performed at that time revealing multivessel CAD; 100% proximal RCA, 100% proximal LAD, 100% ramus intermedius, 85% distal LCx-1, and 100% distal LCx-2.  LIMA-LAD and SVG-OM 3 bypass grafts were patent.  There were 60% stenoses within the SVG-RCA and SVG-D1 grafts.  Additionally, there was total occlusion with significant thrombus noted in the SVG-OM1 bypass graft.  Further intervention deferred opting for aggressive medical management as the SVG was not amenable to thrombectomy/PCI/stent placement.  Patient with severe aortic valve stenosis.  He underwent a TAVR procedure on 05/14/2020 whereby a 23 mm Edwards Sapien S3 ultra bioprosthetic valve was placed.  Patient developed symptomatic second-degree AV block.  He underwent PPM  placement on 05/26/2020.  Device is regularly interrogated by his primary electrophysiology team.  Last interrogation was on 06/03/2022,  at which time it was noted to be functioning properly.  Patient with multiple iliac artery aneurysms.  CTA performed on 06/30/2020 showing a thrombosed RIGHT common iliac artery measuring 3.8 cm, a 4.6 cm LEFT common iliac artery, and a 4.2 cm RIGHT internal iliac artery.  Patient underwent endovascular stenting and repair on 08/19/2020.  Last TTE was performed on 07/08/2022 revealing a normal left ventricular systolic function with mild concentric LVH; LVEF >55%.  GLS -14.0%.  Right ventricle moderately enlarged.  Left atrium severely enlarged.  Right atrium moderately enlarged.  There was trivial to mild pan valvular regurgitation. Bioprosthetic aortic valve present and determined to be well-seated; peak velocity = 2.4 m/s, peak gradient = 23 mmHg, mean gradient = 12 mmHg.  Patient with a paroxysmal atrial fibrillation diagnosis; CHA2DS2-VASc Score = 3 (age, HTN, previous MI). Rate and rhythm maintained on oral metoprolol.  Patient is chronically anticoagulated using daily apixaban + ASA. He is reported to be compliant with therapy with no evidence or reports of GI bleeding.  Blood pressure elevated at 159/73 mmHg on currently prescribed CCB (amlodipine), diuretic (chlorthalidone), ARB (losartan) and beta-blocker (metoprolol tartrate) therapies. Patient is on rosuvastatin for his HLD.  He has a supply of short acting nitrates (NTG) to use on a as needed basis; denies recent use.  Patient is not diabetic. Functional capacity, as defined by DASI, is documented as being >/= 4 METS. No other changes were made to his medication regimen.  Patient to follow-up with outpatient cardiology in 1 year or sooner if needed.  [name] is scheduled for an elective TOTAL KNEE ARTHROPLASTY (Left: Knee) on 09/02/2022 with Dr. Steffanie Rainwater, MD. Given patient's past medical  history significant for cardiovascular diagnoses, presurgical cardiac clearance was sought by the PAT team. Per cardiology, "this patient is optimized for surgery and may proceed with the planned procedural course with a LOW risk of significant perioperative cardiovascular complications".  As previously mentioned, patient is on daily DAPT therapy.  He has been instructed on recommendations from his cardiologist for holding his apixaban dose for 3 days prior to his procedure with plans to restart since postoperative bleeding respectively minimized by his primary attending surgeon.  The patient is aware that his last dose of apixaban should be on 08/29/2022.  The patient will continue his daily low-dose ASA throughout his perioperative course.  Patient denies previous perioperative complications with anesthesia in the past. In review of the available records, it is noted that patient underwent a general anesthetic course at [facility] ([facility]) in 07/2020 without documented complications.      08/20/2022   12:54 PM 08/12/2021   11:14 AM 08/12/2021    8:25 AM  Vitals with BMI  Height '5\' 11"'$     Weight 259 lbs 8 oz    BMI 63.01    Systolic 601 093 235  Diastolic 82 62 76  Pulse 66 66 82    Providers/Specialists:   NOTE: Primary physician provider listed below. Patient may have been seen by APP or partner within same practice.   PROVIDER ROLE / SPECIALTY LAST Danice Goltz, MD Orthopedics (Surgeon) 08/03/2022  Latanya Maudlin, NP Primary Care Provider 06/28/2022  Myriam Jacobson, MD Cardiology 07/08/2022  Mylinda Latina, MD Electrophysiology ???   Allergies:  Patient has no known allergies.  Current Home Medications:   No current facility-administered medications for this encounter.    acetaminophen (TYLENOL) 500 MG tablet   amLODipine (NORVASC) 10 MG tablet  amoxicillin (AMOXIL) 500 MG capsule   aspirin 81 MG chewable tablet   chlorthalidone (HYGROTON)  25 MG tablet   cholecalciferol (VITAMIN D3) 25 MCG (1000 UNIT) tablet   cyanocobalamin 1000 MCG tablet   ELIQUIS 5 MG TABS tablet   HYDROcodone-acetaminophen (NORCO/VICODIN) 5-325 MG tablet   losartan (COZAAR) 100 MG tablet   metoprolol tartrate (LOPRESSOR) 25 MG tablet   rosuvastatin (CRESTOR) 40 MG tablet   tamsulosin (FLOMAX) 0.4 MG CAPS capsule   History:   Past Medical History:  Diagnosis Date   2nd degree AV block    a.) s/p PPM placement 05/26/2020   Adrenal adenoma, left    a.) CT 05/01/2021: measured 6m   Aortic atherosclerosis ([place]    Arthritis    Bilateral renal cysts 05/01/2021   BPH (benign prostatic hyperplasia)    Cardiac arrest with ventricular fibrillation ([place] 09/23/2008   a.) in setting of acute inferior STEMI; 2 episodes requiring a single defibrillation each time prior to ROSC.   Chlamydia    Coronary artery disease    Diastolic dysfunction    a.)  TTE 06/03/2021: EF >55%; severe LA enlargement; bioprosthetic AoV well-seated with a mean transvalvular gradient of 11 mmHg; G2DD.   Excessive daytime sleepiness    Heart murmur    History of kidney stones    History of transcatheter aortic valve replacement (TAVR) 05/14/2020   a.) 23 mm Edwards Sapien S3 Ultra bioprosthetic valve   Hypercholesteremia    Hypertension    I[place]ac artery aneurysm ([place] 06/30/2020   a.) CTA 06/30/2020: RIGHT common iliac artery thrombsed and measured 3.8 cm, LEFT common iliac artery measure 4.6 cm, RIGHT internal iliac artery measure 4.2 cm; s/p endovascular stenting and repair 08/19/2020.   Long term current use of anticoagulant    a.) apixaban   Long term current use of antithrombotics/antiplatelets    a.) DAPT (ASA + clopidogrel)   NSTEMI (non-ST elevated myocardial infarction) ([place] 03/15/2016   a.)  LHC 03/16/2016: 100% pRCA, 100% pLAD, 100% RI, 85% dLCx-1, 100% dLCx-2; LIMA-LAD and SVG-OM 3 bypass grafts patent.  60% stenosis of the SVG-RCA, 60% stenosis of the SVG-D1, and  total occlusion with significant thrombus noted within the SVG-OM1 bypass graft.   PAF (paroxysmal atrial fibrillation) (HCC)    a.) CHA2DS2-VASc = 3 (age, HTN, previous MI). b.) Rate/rhythm maintained on daily metoprolol; carhnically anticoagulated with apixaban + ASA + clopidogrel   Presence of permanent cardiac pacemaker 05/26/2020   a.) Medtronic device   S/P CABG x 5 09/23/2008   a.) LIMA-LAD, SVG-D1, SVG-OM1, SVG-OM3, SVG-PDA   Severe aortic stenosis    a.) s/p TAVR 05/14/2020   ST elevation myocardial infarction (STEMI) of inferior wall ([place] 09/23/2008   a.) VF arrest x 2; ROSC with defibrillation x 1 each time. LHC --> 70% pRCA, 30% oLM, 30% LM, 99% mLCx, 50% dLCx, 90% OM1, 20% OM3, 30% LPL1, 20% pLAD, 30% mLAD-1, 80% mLAD-2, 90% D2; refer to CVTS. Underwent 5v CABG 09/23/2008.   Past Surgical History:  Procedure Laterality Date   APPENDECTOMY     CARDIAC CATHETERIZATION N/A 03/16/2016   Procedure: Coronary/ grafts  Angiography;  Surgeon: BCorey Skains MD;  [facility];  Service: Cardiovascular;  Laterality: N/A;   CARDIAC CATHETERIZATION Left 07/26/2008   Procedure: CARDIAC CATHETERIZATION; Location: [facility]   COLONOSCOPY WITH PROPOFOL N/A 12/03/2019   Procedure: COLONOSCOPY WITH PROPOFOL;  Surgeon: [place], TBenay Pike MD;  Location: [facility] ENDOSCOPY;  Service: Gastroenterology;  Laterality: N/A;  CORONARY ARTERY BYPASS GRAFT N/A 09/23/2008   CORONARY STENT PLACEMENT  2021   CYSTOSCOPY/URETEROSCOPY/HOLMIUM LASER/STENT PLACEMENT Left 08/03/2021   Procedure: CYSTOSCOPY/URETEROSCOPY/HOLMIUM LASER/STENT PLACEMENT-LEFT (POSSIBLE RIGHT);  Surgeon: Hollice Espy, MD;  Location: [facility] ORS;  Service: Urology;  Laterality: Left;   INSERT / REPLACE / REMOVE PACEMAKER  05/26/2020   Medtronic   KNEE ARTHROSCOPY Right    TRANSCATHETER AORTIC VALVE REPLACEMENT, TRANSAORTIC N/A 05/14/2020   Family History  Problem Relation Age of Onset   Hypertension Other    Social  History   Tobacco Use   Smoking status: Never   Smokeless tobacco: Never  Vaping Use   Vaping Use: Never used  Substance Use Topics   Alcohol use: No   Drug use: Never    Pertinent Clinical Results:  LABS:   No visits with results within 3 Day(s) from this visit.  Latest known visit with results is:  Hospital Outpatient Visit on 08/20/2022  Component Date Value Ref Range Status   MRSA, PCR 08/20/2022 NEGATIVE  NEGATIVE Final   Staphylococcus aureus 08/20/2022 NEGATIVE  NEGATIVE Final   Comment: (NOTE) The Xpert SA Assay (FDA approved for NASAL specimens in patients 33 years of age and older), is one component of a comprehensive surveillance program. It is not intended to diagnose infection nor to guide or monitor treatment. Performed at [facility], [address] [place], [place] [address]    WBC 08/20/2022 6.3  4.0 - 10.5 K/uL Final   RBC 08/20/2022 4.46  4.22 - 5.81 MIL/uL Final   Hemoglobin 08/20/2022 13.5  13.0 - 17.0 g/dL Final   HCT 08/20/2022 39.9  39.0 - 52.0 % Final   MCV 08/20/2022 89.5  80.0 - 100.0 fL Final   MCH 08/20/2022 30.3  26.0 - 34.0 pg Final   MCHC 08/20/2022 33.8  30.0 - 36.0 g/dL Final   RDW 08/20/2022 12.8  11.5 - 15.5 % Final   Platelets 08/20/2022 151  150 - 400 K/uL Final   nRBC 08/20/2022 0.0  0.0 - 0.2 % Final   Neutrophils Relative % 08/20/2022 53  % Final   Neutro Abs 08/20/2022 3.3  1.7 - 7.7 K/uL Final   Lymphocytes Relative 08/20/2022 31  % Final   Lymphs Abs 08/20/2022 2.0  0.7 - 4.0 K/uL Final   Monocytes Relative 08/20/2022 10  % Final   Monocytes Absolute 08/20/2022 0.7  0.1 - 1.0 K/uL Final   Eosinophils Relative 08/20/2022 5  % Final   Eosinophils Absolute 08/20/2022 0.3  0.0 - 0.5 K/uL Final   Basophils Relative 08/20/2022 1  % Final   Basophils Absolute 08/20/2022 0.1  0.0 - 0.1 K/uL Final   Immature Granulocytes 08/20/2022 0  % Final   Abs Immature Granulocytes 08/20/2022 0.02  0.00 - 0.07 K/uL Final    Performed at [facility], [address] [place],  [address]   Sodium 08/20/2022 137  135 - 145 mmol/L Final   Potassium 08/20/2022 3.7  3.5 - 5.1 mmol/L Final   Chloride 08/20/2022 104  98 - 111 mmol/L Final   CO2 08/20/2022 24  22 - 32 mmol/L Final   Glucose, Bld 08/20/2022 104 (H)  70 - 99 mg/dL Final   Glucose reference range applies only to samples taken after fasting for at least 8 hours.   BUN 08/20/2022 25 (H)  8 - 23 mg/dL Final   Creatinine, Ser 08/20/2022 1.14  0.61 - 1.24 mg/dL Final   Calcium 08/20/2022 9.5  8.9 -  10.3 mg/dL Final   Total Protein 08/20/2022 7.2  6.5 - 8.1 g/dL Final   Albumin 08/20/2022 3.8  3.5 - 5.0 g/dL Final   AST 08/20/2022 25  15 - 41 U/L Final   ALT 08/20/2022 21  0 - 44 U/L Final   Alkaline Phosphatase 08/20/2022 60  38 - 126 U/L Final   Total Bilirubin 08/20/2022 1.0  0.3 - 1.2 mg/dL Final   GFR, Estimated 08/20/2022 >60  >60 mL/min Final   Comment: (NOTE) Calculated using the CKD-EPI Creatinine Equation (2021)    Anion gap 08/20/2022 9  5 - 15 Final   Performed at [facility], [place], [place] [address]   Color, Urine 08/20/2022 YELLOW (A)  YELLOW Final   APPearance 08/20/2022 CLEAR (A)  CLEAR Final   Specific Gravity, Urine 08/20/2022 1.018  1.005 - 1.030 Final   pH 08/20/2022 6.0  5.0 - 8.0 Final   Glucose, UA 08/20/2022 NEGATIVE  NEGATIVE mg/dL Final   Hgb urine dipstick 08/20/2022 MODERATE (A)  NEGATIVE Final   Bilirubin Urine 08/20/2022 NEGATIVE  NEGATIVE Final   Ketones, ur 08/20/2022 NEGATIVE  NEGATIVE mg/dL Final   Protein, ur 08/20/2022 NEGATIVE  NEGATIVE mg/dL Final   Nitrite 08/20/2022 NEGATIVE  NEGATIVE Final   Leukocytes,Ua 08/20/2022 NEGATIVE  NEGATIVE Final   RBC / HPF 08/20/2022 6-10  0 - 5 RBC/hpf Final   WBC, UA 08/20/2022 0-5  0 - 5 WBC/hpf Final   Bacteria, UA 08/20/2022 NONE SEEN  NONE SEEN Final   Squamous Epithelial / HPF 08/20/2022 0-5  0 - 5 /HPF Final   WBC Clumps  08/20/2022 PRESENT   Final   Mucus 08/20/2022 PRESENT   Final   Performed at [facility], [address] [place], [place] [address]   ABO/RH(D) 08/20/2022 O POS   Final   Antibody Screen 08/20/2022 NEG   Final   Sample Expiration 08/20/2022 09/03/2022,2359   Final   Extend sample reason 08/20/2022    Final                   Value:NO TRANSFUSIONS OR PREGNANCY IN THE PAST 3 MONTHS Performed at [facility], [address] [place], [place] [address]     ECG: Date: 08/20/2022 Rate: 61 bpm Rhythm: Sinus rhythm with first-degree AV block Axis (leads I and aVF): Normal Intervals: PR 260 ms. QRS 120 ms. QTc 426 ms. ST segment and T wave changes: No evidence of acute ST segment elevation or depression Comparison: Similar to previous tracing obtained on 08/10/2021    IMAGING / PROCEDURES: TRANSTHORACIC ECHOCARDIOGRAM performed on 07/08/2022 Normal left ventricular systolic function with mild concentric LVH; LVEF >55% GLS -14.0% Moderately enlarged right ventricle Left atrium severely enlarged right atrium moderately enlarged Mild AR Trivial MR, TR, PR Well-seated bioprosthetic AoV; 2.4 m/s peak velocity; 23 mmHg peak gradient; 12 mmHg mean gradient No pericardial effusion  CT HEMATURIA WORKUP performed on 05/01/2021 8 x 5 x 10 mm stone at or just distal to the left UPJ without hydronephrosis or substantial secondary changes in the left kidney. Bilateral nonobstructing renal stones. Bilateral renal cysts. 14 mm left adrenal adenoma. Status post aorto bi-iliac endograft placement. 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 Vascular surgery consultation may be warranted. 6.  Aortic atherosclerosis  CT AAA PROTOCOL performed on 10/18/2020 Status post endovascular stenting of the infrarenal abdominal aorta and common iliacs and external iliacs and left internal iliac  arteries and coiling of the right internal iliac  with patent stents; there is a small (type 2) endoleak of the infrarenal abdominal aorta with overall stable size aorta at 26 x 29 mm. The IMA is patent and there is an endoleak seen on the arterial phased imaging and there are patent lumbar arteries favored to be a type II endoleak.  No evidence for an endoleak involving the bilateral common iliac arteries status post stenting. Relative decreased opacification arterially of the left femoral and proximal SFA and profunda compared to the right without definite stenosis or stent occlusion identified.   LEFT HEART CATHETERIZATION AND CORONARY ANGIOGRAPHY performed on 03/16/2016 LVEF 45% Multivessel CAD 100% proximal RCA 100% proximal LAD 100% ramus intermedius 85% distal LCx-1 100% distal LCx-2 100% distal LAD 70% insertion lesion 100% mid LAD Grafts LIMA-LAD and SVG-OM3 bypass graft patent 60% SVG-RCA 60% SVG-D1 Occlusion with significant thrombosis throughout SVG-OM1 Recommendation: No further cardiac intervention at this time due to saphenous vein graft not amenable to thrombectomy and/or PCI and/or stent. Continue aggressive medical management.    Impression and Plan:  Joseph Schmitt has been referred for pre-anesthesia review and clearance prior to him undergoing the planned anesthetic and procedural courses. Available labs, pertinent testing, and imaging results were personally reviewed by me in preparation for upcoming operative/procedural course. [facility] medical record has been updated following extensive record review and patient interview with PAT staff.  This patient has been appropriately cleared by cardiology with an overall LOW risk of significant perioperative cardiovascular complications. Completed perioperative prescription for cardiac device management documentation completed by primary cardiology team and placed on patient's chart for review by the surgical/anesthetic team on the day of his procedure.  Electrophysiology indicating that procedure may interfere with planned surgical procedure. Intraoperative magnet use recommended. Beyond normal perioperative cardiovascular monitoring, and the aforementioned magnet placement, there are no recommendations from electrophysiology team that prompt further discussion/recommendations from industry representative.   Based on clinical review performed today (09/01/22), barring any significant acute changes in the patient's overall condition, it is anticipated that he will be able to proceed with the planned surgical intervention. Any acute changes in clinical condition may necessitate his procedure being postponed and/or cancelled. Patient will meet with anesthesia team (MD and/or CRNA) on the day of his procedure for preoperative evaluation/assessment. Questions regarding anesthetic course will be fielded at that time.   Pre-surgical instructions were reviewed with the patient during his PAT appointment, and questions were fielded to satisfaction by PAT clinical staff. He has been instructed on which medications that he will need to hold prior to surgery, as well as the ones that have been deemed safe/appropriate to take of the day of his procedure. As part of the general education provided by PAT, patient made aware both verbally and in writing, that he would need to abstain from the use of any illegal substances during his perioperative course. He was advised that failure to follow the provided instructions could necessitate the need for case cancellation or result serious perioperative complications up to and including death. Patient encouraged to contact PAT and/or his surgeon's office to discuss any questions or concerns that may arise prior to surgery; verbalized understanding.   Honor Loh, MSN, APRN, FNP-C, CEN [facility]  Peri-operative Services Nurse Practitioner Phone: [phone] Fax: [phone] 09/01/22 2:45 PM  NOTE:  This note has been prepared using Dragon dictation software. Despite my best ability to proofread, there is always the potential that unintentional  transcriptional errors may still occur from this process.

## 2022-09-02 ENCOUNTER — Ambulatory Visit: Payer: Medicare HMO | Admitting: Urgent Care

## 2022-09-02 ENCOUNTER — Other Ambulatory Visit: Payer: Self-pay

## 2022-09-02 ENCOUNTER — Encounter
Admission: RE | Disposition: A | Discharge status: Home Health | Payer: Self-pay | Source: Home / Self Care | Attending: Orthopedic Surgery

## 2022-09-02 ENCOUNTER — Observation Stay
Admission: RE | Admit: 2022-09-02 | Discharge: 2022-09-03 | Disposition: A | Discharge status: Home Health | Payer: Medicare HMO | Attending: Orthopedic Surgery | Admitting: Orthopedic Surgery

## 2022-09-02 ENCOUNTER — Ambulatory Visit: Payer: Medicare HMO

## 2022-09-02 ENCOUNTER — Encounter: Payer: Self-pay | Admitting: Orthopedic Surgery

## 2022-09-02 DIAGNOSIS — Z96652 Presence of left artificial knee joint: Secondary | ICD-10-CM | POA: Diagnosis not present

## 2022-09-02 DIAGNOSIS — M1712 Unilateral primary osteoarthritis, left knee: Principal | ICD-10-CM | POA: Insufficient documentation

## 2022-09-02 DIAGNOSIS — Z7901 Long term (current) use of anticoagulants: Secondary | ICD-10-CM | POA: Diagnosis not present

## 2022-09-02 DIAGNOSIS — I2510 Atherosclerotic heart disease of native coronary artery without angina pectoris: Secondary | ICD-10-CM | POA: Insufficient documentation

## 2022-09-02 DIAGNOSIS — M174 Other bilateral secondary osteoarthritis of knee: Secondary | ICD-10-CM | POA: Diagnosis not present

## 2022-09-02 DIAGNOSIS — I480 Paroxysmal atrial fibrillation: Secondary | ICD-10-CM | POA: Insufficient documentation

## 2022-09-02 DIAGNOSIS — Z7902 Long term (current) use of antithrombotics/antiplatelets: Secondary | ICD-10-CM | POA: Diagnosis not present

## 2022-09-02 DIAGNOSIS — Z7982 Long term (current) use of aspirin: Secondary | ICD-10-CM | POA: Diagnosis not present

## 2022-09-02 DIAGNOSIS — Z79899 Other long term (current) drug therapy: Secondary | ICD-10-CM | POA: Insufficient documentation

## 2022-09-02 DIAGNOSIS — Z471 Aftercare following joint replacement surgery: Secondary | ICD-10-CM | POA: Diagnosis not present

## 2022-09-02 DIAGNOSIS — I10 Essential (primary) hypertension: Secondary | ICD-10-CM | POA: Diagnosis not present

## 2022-09-02 DIAGNOSIS — G4730 Sleep apnea, unspecified: Secondary | ICD-10-CM | POA: Diagnosis not present

## 2022-09-02 DIAGNOSIS — Z951 Presence of aortocoronary bypass graft: Secondary | ICD-10-CM | POA: Insufficient documentation

## 2022-09-02 DIAGNOSIS — R600 Localized edema: Secondary | ICD-10-CM | POA: Diagnosis not present

## 2022-09-02 HISTORY — DX: Benign prostatic hyperplasia without lower urinary tract symptoms: N40.0

## 2022-09-02 HISTORY — PX: TOTAL KNEE ARTHROPLASTY: SHX125

## 2022-09-02 LAB — ABO/RH: ABO/RH(D): O POS

## 2022-09-02 SURGERY — ARTHROPLASTY, KNEE, TOTAL
Anesthesia: Spinal | Site: Knee | Laterality: Left

## 2022-09-02 MED ORDER — FENTANYL CITRATE (PF) 100 MCG/2ML IJ SOLN
INTRAMUSCULAR | Status: AC
  Filled 2022-09-02: qty 2

## 2022-09-02 MED ORDER — ACETAMINOPHEN 500 MG PO TABS
1000.0000 mg | ORAL_TABLET | Freq: Three times a day (TID) | ORAL | Status: DC
  Administered 2022-09-03: 1000 mg via ORAL

## 2022-09-02 MED ORDER — ONDANSETRON HCL 4 MG/2ML IJ SOLN: 4.0000 mg | Freq: Four times a day (QID) | INTRAMUSCULAR | Status: DC | PRN

## 2022-09-02 MED ORDER — ONDANSETRON HCL 4 MG PO TABS: 4.0000 mg | ORAL_TABLET | Freq: Four times a day (QID) | ORAL | Status: DC | PRN

## 2022-09-02 MED ORDER — IRRISEPT - 450ML BOTTLE WITH 0.05% CHG IN STERILE WATER, USP 99.95% OPTIME
TOPICAL | Status: DC | PRN
  Administered 2022-09-02: 450 mL

## 2022-09-02 MED ORDER — TAMSULOSIN HCL 0.4 MG PO CAPS
ORAL_CAPSULE | ORAL | Status: AC
  Administered 2022-09-02: 0.4 mg via ORAL
  Filled 2022-09-02: qty 1

## 2022-09-02 MED ORDER — METOPROLOL TARTRATE 25 MG PO TABS
25.0000 mg | ORAL_TABLET | Freq: Two times a day (BID) | ORAL | Status: DC
  Administered 2022-09-02 – 2022-09-03 (×2): 25 mg via ORAL

## 2022-09-02 MED ORDER — SODIUM CHLORIDE 0.9 % IR SOLN
Status: DC | PRN
  Administered 2022-09-02: 3000 mL

## 2022-09-02 MED ORDER — ENOXAPARIN SODIUM 30 MG/0.3ML IJ SOSY: 30.0000 mg | PREFILLED_SYRINGE | Freq: Two times a day (BID) | INTRAMUSCULAR | Status: DC

## 2022-09-02 MED ORDER — DEXAMETHASONE SODIUM PHOSPHATE 10 MG/ML IJ SOLN
8.0000 mg | Freq: Once | INTRAMUSCULAR | Status: AC
  Administered 2022-09-02: 8 mg via INTRAVENOUS

## 2022-09-02 MED ORDER — PROPOFOL 500 MG/50ML IV EMUL
INTRAVENOUS | Status: DC | PRN
  Administered 2022-09-02: 75 ug/kg/min via INTRAVENOUS

## 2022-09-02 MED ORDER — CHLORTHALIDONE 25 MG PO TABS
25.0000 mg | ORAL_TABLET | Freq: Every day | ORAL | Status: DC
  Administered 2022-09-02: 25 mg via ORAL
  Filled 2022-09-02 (×3): qty 1

## 2022-09-02 MED ORDER — BUPIVACAINE HCL (PF) 0.5 % IJ SOLN
INTRAMUSCULAR | Status: AC
  Filled 2022-09-02: qty 10

## 2022-09-02 MED ORDER — DOCUSATE SODIUM 100 MG PO CAPS
100.0000 mg | ORAL_CAPSULE | Freq: Two times a day (BID) | ORAL | Status: DC
  Administered 2022-09-03: 100 mg via ORAL

## 2022-09-02 MED ORDER — LOSARTAN POTASSIUM 50 MG PO TABS
ORAL_TABLET | ORAL | Status: AC
  Filled 2022-09-02: qty 2

## 2022-09-02 MED ORDER — OXYCODONE HCL 5 MG PO TABS: 5.0000 mg | ORAL_TABLET | Freq: Once | ORAL | Status: DC | PRN

## 2022-09-02 MED ORDER — STERILE WATER FOR IRRIGATION IR SOLN
Status: DC | PRN
  Administered 2022-09-02: 1000 mL

## 2022-09-02 MED ORDER — ACETAMINOPHEN 500 MG PO TABS
ORAL_TABLET | ORAL | Status: AC
  Administered 2022-09-02: 1000 mg via ORAL
  Filled 2022-09-02: qty 2

## 2022-09-02 MED ORDER — CEFAZOLIN SODIUM-DEXTROSE 2-4 GM/100ML-% IV SOLN
INTRAVENOUS | Status: AC
  Administered 2022-09-02: 2 g via INTRAVENOUS
  Filled 2022-09-02: qty 100

## 2022-09-02 MED ORDER — KETOROLAC TROMETHAMINE 15 MG/ML IJ SOLN
7.5000 mg | Freq: Four times a day (QID) | INTRAMUSCULAR | Status: DC
  Administered 2022-09-03: 7.5 mg via INTRAVENOUS

## 2022-09-02 MED ORDER — MIDAZOLAM HCL 5 MG/5ML IJ SOLN
INTRAMUSCULAR | Status: DC | PRN
  Administered 2022-09-02: 2 mg via INTRAVENOUS

## 2022-09-02 MED ORDER — SODIUM CHLORIDE (PF) 0.9 % IJ SOLN
INTRAMUSCULAR | Status: DC | PRN
  Administered 2022-09-02: 71 mL via INTRAMUSCULAR

## 2022-09-02 MED ORDER — ROSUVASTATIN CALCIUM 20 MG PO TABS
ORAL_TABLET | ORAL | Status: AC
  Administered 2022-09-02: 40 mg via ORAL
  Filled 2022-09-02: qty 2

## 2022-09-02 MED ORDER — PANTOPRAZOLE SODIUM 40 MG PO TBEC
DELAYED_RELEASE_TABLET | ORAL | Status: AC
  Administered 2022-09-02: 40 mg via ORAL
  Filled 2022-09-02: qty 1

## 2022-09-02 MED ORDER — PANTOPRAZOLE SODIUM 40 MG PO TBEC: 40.0000 mg | DELAYED_RELEASE_TABLET | Freq: Every day | ORAL | Status: DC

## 2022-09-02 MED ORDER — METOPROLOL TARTRATE 25 MG PO TABS
ORAL_TABLET | ORAL | Status: AC
  Filled 2022-09-02: qty 1

## 2022-09-02 MED ORDER — LACTATED RINGERS IV SOLN: INTRAVENOUS | Status: DC

## 2022-09-02 MED ORDER — BUPIVACAINE HCL (PF) 0.5 % IJ SOLN
INTRAMUSCULAR | Status: DC | PRN
  Administered 2022-09-02: 2.4 mL

## 2022-09-02 MED ORDER — CHLORHEXIDINE GLUCONATE 0.12 % MT SOLN
15.0000 mL | Freq: Once | OROMUCOSAL | Status: AC
  Administered 2022-09-02: 15 mL via OROMUCOSAL

## 2022-09-02 MED ORDER — MENTHOL 3 MG MT LOZG: 1.0000 | LOZENGE | OROMUCOSAL | Status: DC | PRN

## 2022-09-02 MED ORDER — MIDAZOLAM HCL 2 MG/2ML IJ SOLN
INTRAMUSCULAR | Status: AC
  Filled 2022-09-02: qty 2

## 2022-09-02 MED ORDER — LACTATED RINGERS IV BOLUS
300.0000 mL | Freq: Once | INTRAVENOUS | Status: AC
  Administered 2022-09-02: 300 mL via INTRAVENOUS

## 2022-09-02 MED ORDER — PHENYLEPHRINE HCL (PRESSORS) 10 MG/ML IV SOLN
INTRAVENOUS | Status: DC | PRN
  Administered 2022-09-02: 80 ug via INTRAVENOUS

## 2022-09-02 MED ORDER — TRANEXAMIC ACID-NACL 1000-0.7 MG/100ML-% IV SOLN
1000.0000 mg | INTRAVENOUS | Status: AC
  Administered 2022-09-02: 1000 mg via INTRAVENOUS

## 2022-09-02 MED ORDER — FENTANYL CITRATE (PF) 100 MCG/2ML IJ SOLN: 25.0000 ug | INTRAMUSCULAR | Status: DC | PRN

## 2022-09-02 MED ORDER — TAMSULOSIN HCL 0.4 MG PO CAPS: 0.4000 mg | ORAL_CAPSULE | Freq: Every day | ORAL | Status: DC

## 2022-09-02 MED ORDER — FAMOTIDINE 20 MG PO TABS
20.0000 mg | ORAL_TABLET | Freq: Once | ORAL | Status: AC
  Administered 2022-09-02: 20 mg via ORAL

## 2022-09-02 MED ORDER — ORAL CARE MOUTH RINSE: 15.0000 mL | Freq: Once | OROMUCOSAL | Status: AC

## 2022-09-02 MED ORDER — METOCLOPRAMIDE HCL 5 MG PO TABS: 5.0000 mg | ORAL_TABLET | Freq: Three times a day (TID) | ORAL | Status: DC | PRN

## 2022-09-02 MED ORDER — CEFAZOLIN SODIUM-DEXTROSE 2-4 GM/100ML-% IV SOLN
2.0000 g | INTRAVENOUS | Status: AC
  Administered 2022-09-02: 2 g via INTRAVENOUS

## 2022-09-02 MED ORDER — SODIUM CHLORIDE 0.9 % IV SOLN: INTRAVENOUS | Status: DC

## 2022-09-02 MED ORDER — DOCUSATE SODIUM 100 MG PO CAPS
ORAL_CAPSULE | ORAL | Status: AC
  Filled 2022-09-02: qty 1

## 2022-09-02 MED ORDER — ACETAMINOPHEN 10 MG/ML IV SOLN: 1000.0000 mg | Freq: Once | INTRAVENOUS | Status: DC | PRN

## 2022-09-02 MED ORDER — PROPOFOL 1000 MG/100ML IV EMUL
INTRAVENOUS | Status: AC
  Filled 2022-09-02: qty 100

## 2022-09-02 MED ORDER — KETOROLAC TROMETHAMINE 15 MG/ML IJ SOLN
INTRAMUSCULAR | Status: AC
  Administered 2022-09-02: 7.5 mg via INTRAVENOUS
  Filled 2022-09-02: qty 1

## 2022-09-02 MED ORDER — HYDROCODONE-ACETAMINOPHEN 5-325 MG PO TABS: 1.0000 | ORAL_TABLET | ORAL | Status: DC | PRN

## 2022-09-02 MED ORDER — ONDANSETRON HCL 4 MG/2ML IJ SOLN: 4.0000 mg | Freq: Once | INTRAMUSCULAR | Status: DC | PRN

## 2022-09-02 MED ORDER — TRAMADOL HCL 50 MG PO TABS: 50.0000 mg | ORAL_TABLET | Freq: Four times a day (QID) | ORAL | Status: DC | PRN

## 2022-09-02 MED ORDER — APIXABAN 2.5 MG PO TABS
5.0000 mg | ORAL_TABLET | Freq: Two times a day (BID) | ORAL | Status: DC
  Administered 2022-09-03: 5 mg via ORAL

## 2022-09-02 MED ORDER — METOCLOPRAMIDE HCL 5 MG/ML IJ SOLN: 5.0000 mg | Freq: Three times a day (TID) | INTRAMUSCULAR | Status: DC | PRN

## 2022-09-02 MED ORDER — CEFAZOLIN SODIUM-DEXTROSE 2-4 GM/100ML-% IV SOLN: 2.0000 g | Freq: Four times a day (QID) | INTRAVENOUS | Status: AC

## 2022-09-02 MED ORDER — AMLODIPINE BESYLATE 5 MG PO TABS
10.0000 mg | ORAL_TABLET | Freq: Every day | ORAL | Status: DC
  Administered 2022-09-03: 10 mg via ORAL

## 2022-09-02 MED ORDER — PHENOL 1.4 % MT LIQD: 1.0000 | OROMUCOSAL | Status: DC | PRN

## 2022-09-02 MED ORDER — PHENYLEPHRINE HCL-NACL 20-0.9 MG/250ML-% IV SOLN
INTRAVENOUS | Status: DC | PRN
  Administered 2022-09-02: 50 ug/min via INTRAVENOUS

## 2022-09-02 MED ORDER — DOCUSATE SODIUM 100 MG PO CAPS
ORAL_CAPSULE | ORAL | Status: AC
  Administered 2022-09-02: 100 mg via ORAL
  Filled 2022-09-02: qty 1

## 2022-09-02 MED ORDER — OXYCODONE HCL 5 MG/5ML PO SOLN: 5.0000 mg | Freq: Once | ORAL | Status: DC | PRN

## 2022-09-02 MED ORDER — FENTANYL CITRATE (PF) 100 MCG/2ML IJ SOLN
INTRAMUSCULAR | Status: DC | PRN
  Administered 2022-09-02: 25 ug via INTRAVENOUS

## 2022-09-02 MED ORDER — LOSARTAN POTASSIUM 50 MG PO TABS: 100.0000 mg | ORAL_TABLET | Freq: Every day | ORAL | Status: DC

## 2022-09-02 MED ORDER — ROSUVASTATIN CALCIUM 20 MG PO TABS
40.0000 mg | ORAL_TABLET | Freq: Every day | ORAL | Status: DC
  Administered 2022-09-03: 40 mg via ORAL

## 2022-09-02 MED ORDER — ASPIRIN 81 MG PO CHEW
81.0000 mg | CHEWABLE_TABLET | Freq: Every day | ORAL | Status: DC
  Administered 2022-09-03: 81 mg via ORAL

## 2022-09-02 MED ORDER — ASPIRIN 81 MG PO CHEW
CHEWABLE_TABLET | ORAL | Status: AC
  Administered 2022-09-02: 81 mg via ORAL
  Filled 2022-09-02: qty 1

## 2022-09-02 MED ORDER — MORPHINE SULFATE (PF) 4 MG/ML IV SOLN: 0.5000 mg | INTRAVENOUS | Status: DC | PRN

## 2022-09-02 SURGICAL SUPPLY — 79 items
BLADE PATELLA W-PILOT HOLE 35 (BLADE) IMPLANT
BLADE SAW SAG 25X90X1.19 (BLADE) ×1 IMPLANT
BLADE SAW SAG 29X58X.64 (BLADE) ×1 IMPLANT
BNDG ELASTIC 6X5.8 VLCR STR LF (GAUZE/BANDAGES/DRESSINGS) ×1 IMPLANT
BOWL CEMENT MIX W/ADAPTER (MISCELLANEOUS) ×1 IMPLANT
CEMENT BONE R 1X40 (Cement) ×2 IMPLANT
CHLORAPREP W/TINT 26 (MISCELLANEOUS) ×2 IMPLANT
COOLER POLAR GLACIER W/PUMP (MISCELLANEOUS) ×1 IMPLANT
CUFF TOURN SGL QUICK 24 (TOURNIQUET CUFF)
CUFF TOURN SGL QUICK 34 (TOURNIQUET CUFF)
CUFF TRNQT CYL 24X4X16.5-23 (TOURNIQUET CUFF) IMPLANT
CUFF TRNQT CYL 34X4.125X (TOURNIQUET CUFF) IMPLANT
DERMABOND ADVANCED .7 DNX12 (GAUZE/BANDAGES/DRESSINGS) ×1 IMPLANT
DRAPE 3/4 80X56 (DRAPES) ×1 IMPLANT
DRAPE INCISE IOBAN 66X60 STRL (DRAPES) ×1 IMPLANT
DRSG MEPILEX SACRM 8.7X9.8 (GAUZE/BANDAGES/DRESSINGS) ×1 IMPLANT
DRSG OPSITE POSTOP 4X10 (GAUZE/BANDAGES/DRESSINGS) IMPLANT
DRSG OPSITE POSTOP 4X8 (GAUZE/BANDAGES/DRESSINGS) IMPLANT
ELECT CAUTERY BLADE 6.4 (BLADE) ×1 IMPLANT
ELECT REM PT RETURN 9FT ADLT (ELECTROSURGICAL) ×1
ELECTRODE REM PT RTRN 9FT ADLT (ELECTROSURGICAL) ×1 IMPLANT
GLOVE BIO SURGEON STRL SZ8 (GLOVE) ×1 IMPLANT
GLOVE BIOGEL PI IND STRL 8 (GLOVE) ×1 IMPLANT
GLOVE PI ORTHO PRO STRL 7.5 (GLOVE) ×2 IMPLANT
GLOVE PI ORTHO PRO STRL SZ8 (GLOVE) ×2 IMPLANT
GOWN STRL REUS W/ TWL LRG LVL3 (GOWN DISPOSABLE) ×1 IMPLANT
GOWN STRL REUS W/ TWL XL LVL3 (GOWN DISPOSABLE) ×2 IMPLANT
GOWN STRL REUS W/TWL LRG LVL3 (GOWN DISPOSABLE) ×1
GOWN STRL REUS W/TWL XL LVL3 (GOWN DISPOSABLE) ×2
HANDLE YANKAUER SUCT OPEN TIP (MISCELLANEOUS) ×1 IMPLANT
HDLS TROCR DRIL PIN KNEE 75 (PIN) ×1
HOLDER FOLEY CATH W/STRAP (MISCELLANEOUS) ×1 IMPLANT
HOOD PEEL AWAY T7 (MISCELLANEOUS) ×3 IMPLANT
INSERT COMP PS 8-11 GH LT (Insert) IMPLANT
IV NS IRRIG 3000ML ARTHROMATIC (IV SOLUTION) ×1 IMPLANT
JET LAVAGE IRRISEPT WOUND (IRRIGATION / IRRIGATOR) ×1
KIT TURNOVER KIT A (KITS) ×1 IMPLANT
LAVAGE JET IRRISEPT WOUND (IRRIGATION / IRRIGATOR) IMPLANT
MANIFOLD NEPTUNE II (INSTRUMENTS) ×1 IMPLANT
MARKER SKIN DUAL TIP RULER LAB (MISCELLANEOUS) ×1 IMPLANT
MAT ABSORB  FLUID 56X50 GRAY (MISCELLANEOUS) ×1
MAT ABSORB FLUID 56X50 GRAY (MISCELLANEOUS) ×1 IMPLANT
NDL HYPO 21X1.5 SAFETY (NEEDLE) ×1 IMPLANT
NDL SAFETY ECLIP 18X1.5 (MISCELLANEOUS) ×1 IMPLANT
NEEDLE HYPO 21X1.5 SAFETY (NEEDLE) ×1 IMPLANT
NS IRRIG 500ML POUR BTL (IV SOLUTION) ×1 IMPLANT
PACK TOTAL KNEE (MISCELLANEOUS) ×1 IMPLANT
PAD WRAPON POLAR KNEE (MISCELLANEOUS) ×1 IMPLANT
PENCIL SMOKE EVACUATOR (MISCELLANEOUS) ×1 IMPLANT
PIN DRILL HDLS TROCAR 75 4PK (PIN) IMPLANT
PSN FEM CR CMT CCR STD SZ10 L (Joint) ×1 IMPLANT
PULSAVAC PLUS IRRIG FAN TIP (DISPOSABLE) ×1
SCREW FEMALE HEX FIX 25X2.5 (ORTHOPEDIC DISPOSABLE SUPPLIES) IMPLANT
SCREW HEX HEADED 3.5X27 DISP (ORTHOPEDIC DISPOSABLE SUPPLIES) IMPLANT
SLEEVE SCD COMPRESS KNEE MED (STOCKING) ×1 IMPLANT
SOLUTION IRRIG SURGIPHOR (IV SOLUTION) ×1 IMPLANT
STEM POLY PAT PLY 38M KNEE (Knees) IMPLANT
STEM TIB ST PERS 14+30 (Stem) IMPLANT
STEM TIBIA 5 DEG SZ G L KNEE (Knees) IMPLANT
SURFACE ARTC PRSNA CCR SZ10 L (Joint) IMPLANT
SUT DVC 2 QUILL PDO  T11 36X36 (SUTURE) ×1
SUT DVC 2 QUILL PDO T11 36X36 (SUTURE) ×1 IMPLANT
SUT QUILL MONODERM 3-0 PS-2 (SUTURE) ×1 IMPLANT
SUT VIC AB 0 CT1 36 (SUTURE) ×1 IMPLANT
SUT VIC AB 2-0 CT2 27 (SUTURE) ×2 IMPLANT
SUT VICRYL 1-0 27IN ABS (SUTURE) ×1
SUTURE VICRYL 1-0 27IN ABS (SUTURE) ×1 IMPLANT
SYR 10ML LL (SYRINGE) ×1 IMPLANT
SYR 30ML LL (SYRINGE) ×2 IMPLANT
SYR 3ML LL SCALE MARK (SYRINGE) ×1 IMPLANT
TAPE CLOTH 3X10 WHT NS LF (GAUZE/BANDAGES/DRESSINGS) ×1 IMPLANT
TIBIA STEM 5 DEG SZ G L KNEE (Knees) ×1 IMPLANT
TIP FAN IRRIG PULSAVAC PLUS (DISPOSABLE) ×1 IMPLANT
TOWEL OR 17X26 4PK STRL BLUE (TOWEL DISPOSABLE) IMPLANT
TRAP FLUID SMOKE EVACUATOR (MISCELLANEOUS) ×1 IMPLANT
TRAY FOLEY SLVR 16FR LF STAT (SET/KITS/TRAYS/PACK) ×1 IMPLANT
TUBE KAMVAC SUCTION (TUBING) IMPLANT
WATER STERILE IRR 1000ML POUR (IV SOLUTION) ×1 IMPLANT
WRAPON POLAR PAD KNEE (MISCELLANEOUS) ×1

## 2022-09-02 NOTE — Transfer of Care (Signed)
Immediate Anesthesia Transfer of Care Note  Patient: Seab Axel  Procedure(s) Performed: TOTAL KNEE ARTHROPLASTY (Left: Knee)  Patient Location: PACU  Anesthesia Type:Spinal  Level of Consciousness: awake and alert   Airway & Oxygen Therapy: Patient Spontanous Breathing and Patient connected to face mask oxygen  Post-op Assessment: Report given to RN and Post -op Vital signs reviewed and stable  Post vital signs: Reviewed and stable  Last Vitals:  Vitals Value Taken Time  BP 87/53 09/02/22 1315  Temp    Pulse 71 09/02/22 1316  Resp 25 09/02/22 1316  SpO2 97 % 09/02/22 1316  Vitals shown include unvalidated device data.  Last Pain:  Vitals:   09/02/22 0905  TempSrc: Oral  PainSc: 0-No pain      Patients Stated Pain Goal: 0 (05/21/24 3664)  Complications: No notable events documented.

## 2022-09-02 NOTE — H&P (Signed)
History of Present Illness: The patient is an 67 y.o. male seen in clinic today for left knee pain. The patient reports he has had pain in his left knee for more than a year now which is affecting him on a daily basis and affecting his activities of daily living. He reports that the pain prevents him from walking and from kneeling and doing things due to his knee. He reports the pain radiates down the medial side of his knee and is up to a 9/10. He reports a grinding sensation in his knee as well as some catching but no frank locking. He underwent a steroid injection on 05/11/2022 and received no relief from that injection. The patient has been treated with Tylenol as he is unable to take anti-inflammatories doing to being on Eliquis and has done home exercises without improvement. The patient denies fevers, chills, numbness, tingling, shortness of breath, chest pain, recent illness, or any trauma.  He is a non-smoker his BMI is 35.6 and his last A1c was 6.2  Past Medical History: Past Medical History: Diagnosis Date CAD (coronary artery disease) Cardiac murmur systolic ejection murmur at the apex. EF 45-50%. Chlamydia Hyperlipidemia Hypertension IGT (impaired glucose tolerance) 06/15 Myocardial infarction (CMS-HCC) 09-07-2022 w/ sudden death STEMI (ST elevation myocardial infarction) (CMS-HCC) 2010 Tubular adenoma of colon  Past Surgical History: Past Surgical History: Procedure Laterality Date ARTHROSCOPIC REPAIR ACL Right 1976 CORONARY ARTERY BYPASS GRAFT 2010 x5 COLONOSCOPY 12/03/2019 Diverticulosis/PHx CP/Repeat 3yr/TKT TRANSCATHETER AORTIC VALVE REPLACEMENT Left 05/14/2020 Procedure: TRANSCATHETER AORTIC VALVE REPLACEMENT (TAVR / TAVI) WITH PROSTHETIC VALVE; OPEN CAROTID ARTERY APPROACH, sapein ultra, commercial, left carotid; Surgeon: GDanna Hefty MD; Location: [facility]; Service: Cardiothoracic; Laterality: Left; TRANSCATHETER AORTIC VALVE REPLACEMENT N/A  05/14/2020 Procedure: TRANSCATHETER AORTIC VALVE REPLACEMENT (TAVR / TAVI) WITH PROSTHETIC VALVE; OPEN CAROTID ARTERY APPROACH, sapein ultra, commercial, left carotid; Surgeon: HJoline Maxcy MD; Location: [facility]; Service: General Surgery; Laterality: N/A; VASCULAR EMBOLIZATION / OCCLUSION VENOUS OTHER THAN HEMORRHAGE / [id number] Right 08/12/2020 Procedure: VASCULAR EMBOLIZATION / OCCLUSION, WITH RADIOLOGICAL SUPERVISION AND INTERPRETATION, INTRAPROCEDURAL ROADMAPPING, AND IMAGING GUIDANCE NEEDED FOR INTERVENTION; VENOUS, OTHER THAN HEMORRHAGE / TUMOR; Surgeon: LNathaniel Man MD; Location: [facility] Service: General Surgery; Laterality: Right; SELECTIVE PLACEMENT ARTERIAL SYSTEM CATH ABDOMEN Right 08/12/2020 Procedure: SELECTIVE CATHETER PLACEMENT, ARTERIAL SYSTEM; EACH FIRST ORDER ABDOMINAL, PELVIC, OR LOWER EXTREMITY ARTERY BRANCH, WITHIN A VASCULAR FAMILY; Surgeon: LNathaniel Man MD; Location: [facility] Service: General Surgery; Laterality: Right; ENDOVASCULAR TRANSLUMINAL BALLOON ANGIOPLASTY ILIAC ARTERY W/STENT Right 08/19/2020 Procedure: REVASCULARIZATION, ENDOVASCULAR, OPEN OR PERCUTANEOUS, ILIAC ARTERY, UNILATERAL, INITIAL VESSEL; WITH TRANSLUMINAL STENT PLACEMENT(S), INCLUDES ANGIOPLASTY WITHIN THE SAME VESSEL, WHEN PERFORMED; Surgeon: LNathaniel Man MD; Location: [facility] Service: General Surgery; Laterality: Right; APPENDECTOMY  Past Family History: Family History Problem Relation Age of Onset High blood pressure (Hypertension) Mother Myocardial Infarction (Heart attack) Mother 651High blood pressure (Hypertension) Father Pneumonia Father Smoker Diabetes type II Sister Myocardial Infarction (Heart attack) Sister Anesthesia problems Neg Hx  Medications: Current Outpatient Medications Ordered in Epic Medication Sig Dispense Refill acetaminophen (TYLENOL ORAL) Take by mouth as needed amLODIPine (NORVASC)  10 MG tablet TAKE 1 TABLET(10 MG) BY MOUTH EVERY DAY 90 tablet 3 amoxicillin (AMOXIL) 500 MG tablet Take 4 tablets (2,000 mg total) by mouth as directed (Take 4 tablets equal to 2 g 1 hour before dental visits or other procedures likely to cause bacteremia.) 20 tablet 2 apixaban (ELIQUIS) 5 mg tablet Take 1 tablet (5 mg total) by  mouth every 12 (twelve) hours 180 tablet 3 aspirin 81 MG EC tablet Take 81 mg by mouth once daily chlorthalidone 25 MG tablet Take 1 tablet (25 mg total) by mouth once daily 90 tablet 3 cholecalciferol (VITAMIN D3) 2,000 unit capsule Take 1 capsule (2,000 Units total) by mouth once daily 360 capsule 11 cyanocobalamin (VITAMIN B12) 1000 MCG tablet TAKE 1 TABLET BY MOUTH ONCE DAILY 90 tablet 0 losartan (COZAAR) 100 MG tablet Take 1 tablet (100 mg total) by mouth once daily 90 tablet 2 metoprolol tartrate (LOPRESSOR) 25 MG tablet TAKE 1 TABLET(25 MG) BY MOUTH EVERY 12 HOURS 60 tablet 0 rosuvastatin (CRESTOR) 40 MG tablet TAKE 1 TABLET(40 MG) BY MOUTH EVERY MORNING 90 tablet 0 sennosides-docusate (SENOKOT-S) 8.6-50 mg tablet Take 1 tablet by mouth 2 (two) times daily (Patient taking differently: Take 1 tablet by mouth 2 (two) times daily as needed) 0 tadalafiL (CIALIS) 10 MG tablet Take 1 tablet (10 mg total) by mouth once daily as needed for Erectile Dysfunction for up to 30 days Take dose 30-45 min prior to anticipated sexual activity. 30 tablet 0  No current Epic-ordered facility-administered medications on file.  Allergies: No Known Allergies  Review of Systems: A comprehensive 14 point ROS was performed, reviewed, and the pertinent orthopaedic findings are documented in the HPI.  Physical Exam: General/Constitutional: No apparent distress: well-nourished and well developed. Eyes: Pupils equal, round with synchronous movement. Pulmonary exam: Lungs clear to auscultation bilaterally no wheezing rales or rhonchi Cardiac exam: Regular rate and rhythm no obvious murmurs  rubs or gallops. Integumentary: No impressive skin lesions present, except as noted in detailed exam. Neuro/Psych: Normal mood and affect, oriented to person, place and time.  Comprehensive Knee Exam: Gait Non-antalgic and fluid Alignment Neutral  Inspection Right Left Skin Normal appearance with no obvious deformity. No ecchymosis or erythema. Healed midline incision Normal appearance with no obvious deformity. No ecchymosis or erythema. Soft Tissue No focal soft tissue swelling No focal soft tissue swelling Quad Atrophy None None  Palpation Right Left Tenderness Medial joint line tenderness to palpation Medial joint line tenderness to palpation Crepitus + patellofemoral and tibiofemoral crepitus + patellofemoral and tibiofemoral crepitus Effusion None None  Range of Motion            Right            Left Flexion 0-115      0-100 Extension Full knee extension without hyperextension Full knee extension without hyperextension  Ligamentous Exam Right Left Lachman Normal Normal Valgus 0 Normal Normal Valgus 30 Normal Normal Varus 0 Normal Normal Varus 30 Normal Normal Anterior Drawer Normal Normal Posterior Drawer Normal Normal  Meniscal Exam Right Left Hyperflexion Test Positive Positive Hyperextension Test Negative Negative McMurray's Negative Negative  Neurovascular Right Left Quadriceps Strength 5/5 5/5 Hamstring Strength 5/5 5/5 Hip Abductor Strength 4/5 4/5 Distal Motor Normal Normal Distal Sensory Normal light touch sensation Normal light touch sensation Distal Pulses Normal Normal   Imaging Studies: I have reviewed AP, lateral,sunrise, PA flex , weight bearing knee X-rays (4 views) of the left knee ordered and taken today in the office which show severe degenerative changes with complete medial, lateral, and patellofemoral joint space narrowing with bone-on-bone articulation, osteophyte formation, subchondral cysts and sclerosis. There is a varus alignment of  the knee with lateral subluxation of the tibia relative to the femur. Kellgren-Lawrence grade 4. AP, sunrise, PA flex of the right knee also show severe degenerative changes with medial bone-on-bone articulation and patellofemoral bone-on-bone  articulation with osteophyte formation, sclerosis, subchondral cyst formation. Right knee Kellgren-Lawrence grade 4. No fractures or dislocations noted in either knee.  Assessment: ICD-10-CM 1. Other bilateral secondary osteoarthritis of knee M17.4 Left knee osteoarthritis  Plan: Braxson is a 67 year old male who presents with left knee bone on bone arthritis. Based upon the patient's continued symptoms and failure to respond to conservative treatment, I have recommended a left total knee replacement for this patient. A long discussion took place with the patient describing what a total joint replacement is and what the procedure would entail. A knee model, similar to the implants that will be used during the operation, was utilized to demonstrate the implants. Choices of implant manufactures were discussed and reviewed. The ability to secure the implant utilizing cement or cementless (press fit) fixation was discussed. The approach and exposure was discussed.  The hospitalization and post-operative care and rehabilitation were also discussed. The use of perioperative antibiotics and DVT prophylaxis were discussed. The risk, benefits and alternatives to a surgical intervention were discussed at length with the patient. The patient was also advised of risks related to the medical comorbidities and elevated body mass index (BMI). A lengthy discussion took place to review the most common complications including but not limited to: stiffness, loss of function, complex regional pain syndrome, deep vein thrombosis, pulmonary embolus, heart attack, stroke, infection, wound breakdown, numbness, intraoperative fracture, damage to nerves, tendon,muscles, arteries or other blood  vessels, death and other possible complications from anesthesia. The patient was told that we will take steps to minimize these risks by using sterile technique, antibiotics and DVT prophylaxis when appropriate and follow the patient postoperatively in the office setting to monitor progress. The possibility of recurrent pain, no improvement in pain and actual worsening of pain were also discussed with the patient.  The discharge plan of care focused on the patient going home following surgery. The patient was encouraged to make the necessary arrangements to have someone stay with them when they are discharged home.  The benefits of surgery were discussed with the patient including the potential for improving the patient's current clinical condition through operative intervention. Alternatives to surgical intervention including continued conservative management were also discussed in detail. All questions were answered to the satisfaction of the patient. The patient participated and agreed to the plan of care as well as the use of the recommended implants for their total knee replacement surgery. An information packet was given to the patient to review prior to surgery.  Patient received medical clearance and cardiac clearance for surgery. Surgery will need to be after 08/11/2022 due to a steroid injection on 05/11/2022.  All questions answered the patient agrees to the above plan to proceed with a left total knee replacement.  Portions of this record have been created using [employer]. Dictation errors have been sought, but may not have been identified and corrected.  Steffanie Rainwater MD

## 2022-09-02 NOTE — Op Note (Signed)
Patient Name: Joseph Schmitt  SPQ:[id number]  Pre-Operative Diagnosis: Left knee Osteoarthritis  Post-Operative Diagnosis: (same)  Procedure: Left Total Knee Arthroplasty  Components/Implants: Femur: Persona Size 10 CR   Tibia: Persona Size G w/14x28m stem extension  Poly: 166mMC  Patella: 38 x 9.67m74mymmetric   Femoral Valgus Cut Angle: 5 degrees  Distal Femoral Re-cut: none  Patella Resurfacing: yes   Date of Surgery: 09/02/2022  Surgeon: ZacSteffanie Rainwater  Assistant: ThoDorise Hiss (present and scrubbed throughout the case, critical for assistance with exposure, retraction, instrumentation, and closure), Maya PAS   Anesthesiologist: ZakBertell Marianesthesia: Spinal  Tourniquet Time: 73 min  EBL: 75cc  IVF: [id number]: None   Brief history: The patient is a 66 29ar old male with a history of osteoarthritis of the left knee with pain limiting their range of motion and activities of daily living, which has failed multiple attempts at conservative therapy.  The risks and benefits of total knee arthroplasty as definitive surgical treatment were discussed with the patient, who opted to proceed with the operation.  After outpatient medical clearance and optimization was completed [facility]  All preoperative films were reviewed and an appropriate surgical plan was made prior to surgery. Preoperative range of motion was 0 to 100. The patient was identified as having a varus alignment.   Description of procedure: The patient was brought to the operating room where laterality was confirmed by all those present to be the left side.   Spinal anesthesia was administered and the patient received an intravenous dose of antibiotics for surgical prophylaxis and a dose of tranexamic acid.  Patient is positioned supine on the operating room table with all bony prominences well-padded.  A well-padded tourniquet was  applied to the left thigh.  The knee was then prepped and draped in usual sterile fashion with multiple layers of adhesive and nonadhesive drapes.  All of those present in the operating room participated in a surgical timeout laterality and patient were confirmed.   An Esmarch was wrapped around the extremity and the leg was elevated and the knee flexed.  The tourniquet was inflated to a pressure of 275 mmHg. The Esmarch was removed and the leg was brought down to full extension.  The patella and tibial tubercle identified and outlined using a marking pen and a midline skin incision was made with a knife carried through the subcutaneous tissue down to the extensor retinaculum.  After exposure of the extensor mechanism the medial parapatellar arthrotomy was performed with a scalpel and electrocautery extending down medial and distal to the tibial tubercle taking care to avoid incising the patellar tendon.   A standard medial release was performed over the proximal tibia.  The knee was brought into extension in order to excise the fat pad taking care not to damage the patella tendon.  The superior soft tissue was removed from the anterior surface of the distal femur to visualize for the procedure.  The knee was then brought into flexion with the patella subluxed laterally and subluxing the tibia anteriorly.  The ACL was transected and removed with electrocautery and additional soft tissue was removed from the proximal surface of the tibia to fully expose. The PCL was found to be intact and was preserved.  An extramedullary tibial cutting guide was then applied to the leg with a spring-loaded ankle clamp placed around the distal tibia just above the malleoli the angulation of the guide  was adjusted to give some posterior slope in the tibial resection with an appropriate varus/valgus alignment.  The resection guide was then pinned to the proximal tibia and the proximal tibial surface was resected with an  oscillating saw.  Careful attention was paid to ensure the blade did not disrupt any of the soft tissues including any lateral or medial ligament.  Attention was then turned to the femur, with the knee slightly flexed a opening drill was used to enter the medullary canal of the femur.  After removing the drill marrow was suctioned out to decompress the distal femur.  An intramedullary femoral guide was then inserted into the drill hole and the alignment guide was seated firmly against the distal end of the medial femoral condyle.  The distal femoral cutting guide was then attached and pinned securely to the anterior surface of the femur and the intramedullary rod and alignment guide was removed.  Distal femur resection was then performed with an oscillating saw with retractors protecting medial and laterally.   The distal cutting block was then removed and the extension gap was checked with a spacer.  Extension gap was found to be appropriately sized to accommodate the spacer block.   The femoral sizing guide was then placed securely into the posterior condyles of the femur and the femoral size was measured and determined to be 10.  The size 10; 4-in-1 cutting guide was placed in position and secured with 2 pins.  The anterior posterior and chamfer resections were then performed with an oscillating saw.  Bony fragments and osteophytes were then removed.  Using a lamina spreader the posterior medial and lateral condyles were checked for additional osteophytes and posterior soft tissue remnants.  Any remaining meniscus was removed at this time.  Periarticular injection was performed in the meniscal rims and posterior capsule with aspiration performed to ensure no intravascular injection.   The tibia was then exposed and the tibial trial was pinned onto the plateau after confirming appropriate orientation and rotation.  Using the drill bushing the tibia was prepared to the appropriate drill depth.  Tibial  broach impactor was then driven through the punch guide using a mallet.  The femoral trial component was then inserted onto the femur. A trial tibial polyethylene bearing was then placed and the knee was reduced.  The knee achieved full extension with no hyperextension and was found to be balanced in flexion and extension with the trials in place.  The knee was then brought into full extension the patella was everted and held with 2 Kocher clamps.  The articular surface of the patella was then resected with an patella reamer and saw after careful measurement with a caliper.  The patella was then prepared with the drill guide and a trial patella was placed.  The knee was then taken through range of motion and it was found that the patella circulated appropriately with the trochlea and good patellofemoral motion without subluxation.    The correct final components for implantation were confirmed and opened by the circulator nurse.  The prepared surfaces of the patella femur and tibia were cleaned with pulsatile lavage to remove all blood fat and other material and then the surfaces were dried.  2 bags of cement were mixed under vacuum and the components were cemented into place.  Excess cement was removed with curettes and forceps.  The final polyethylene tibial component was implanted and the knee was brought into full extension to allow the cement to set.  At this time the periarticular injection cocktail was placed in the soft tissues surrounding the knee.  The knee was then irrigated with copious amount of normal saline via pulsatile lavage to remove all loose bodies and other debris.  The knee was then irrigated with Irrisept and reirrigated with saline.  The tourniquet was then dropped and all bleeding vessels were identified and coagulated.  The arthrotomy was approximated with #1 Vicryl and closed with #2 Quill suture.  The knee was brought into slight flexion and the subcutaneous tissues were closed with 0  Vicryl, 2-0 Vicryl and a running subcuticular 3-0 monoderm quil suture.  Skin was then glued with Dermabond.  A sterile adhesive dressing was then placed along with a sequential compression device to the calf, a Ted stocking, and a cryotherapy cuff.   Sponge, needle, and Lap counts were all correct at the end of the case.   The patient was transferred off of the operating room table to a hospital bed, good pulses were found distally on the operative side.  The patient was transferred to the recovery room in stable condition.

## 2022-09-02 NOTE — Evaluation (Signed)
Physical Therapy Evaluation Patient Details Name: Joseph Schmitt MRN: [id number] DOB: [birth year] Today's Date: 09/02/2022  History of Present Illness  Pt is a 67 y/o M admitted on 09/02/22 for scheduled L TKA. PMH: CAD, cardiac murmur, HLD, HTN, MI, STEMI  Clinical Impression  Pt seen for PT evaluation with pt agreeable to tx. Provided pt with HEP & pt performed LLE exercises with AAROM PRN & cuing for technique. Pt is able to complete bed mobility with mod I, STS with min assist & stand pivot bed>Recliner with RW & min assist. Pt demonstrates posterior lean & impaired balance, reporting LLE is "not awake' yet. Will continue to follow pt acutely to address balance, strengthening, gait & stair negotiation.       Recommendations for follow up therapy are one component of a multi-disciplinary discharge planning process, led by the attending physician.  Recommendations may be updated based on patient status, additional functional criteria and insurance authorization.  Follow Up Recommendations Home health PT      Assistance Recommended at Discharge Intermittent Supervision/Assistance  Patient can return home with the following  A little help with walking and/or transfers;A little help with bathing/dressing/bathroom;Assist for transportation;Assistance with cooking/housework;Help with stairs or ramp for entrance    Equipment Recommendations Rolling walker (2 wheels)  Recommendations for Other Services       Functional Status Assessment Patient has had a recent decline in their functional status and demonstrates the ability to make significant improvements in function in a reasonable and predictable amount of time.     Precautions / Restrictions Precautions Precautions: Fall Restrictions Weight Bearing Restrictions: Yes LLE Weight Bearing: Weight bearing as tolerated      Mobility  Bed Mobility Overal bed mobility: Modified Independent Bed Mobility: Supine to Sit     Supine to  sit: Modified independent (Device/Increase time), HOB elevated     General bed mobility comments: use of bed rails PRN    Transfers Overall transfer level: Needs assistance Equipment used: Rolling walker (2 wheels) Transfers: Sit to/from Stand, Bed to chair/wheelchair/BSC Sit to Stand: Min assist   Step pivot transfers: Min assist       General transfer comment: Educational cuing re: safe hand placement during STS with RW.    Ambulation/Gait                  Stairs            Wheelchair Mobility    Modified Rankin (Stroke Patients Only)       Balance Overall balance assessment: Needs assistance Sitting-balance support: Feet supported Sitting balance-Leahy Scale: Fair   Postural control: Posterior lean Standing balance support: During functional activity, Bilateral upper extremity supported, Reliant on assistive device for balance Standing balance-Leahy Scale: Poor                               Pertinent Vitals/Pain Pain Assessment Pain Assessment: No/denies pain    Home Living Family/patient expects to be discharged to:: Private residence Living Arrangements: Spouse/significant other Available Help at Discharge: Family;Available 24 hours/day Type of Home: House Home Access: Stairs to enter Entrance Stairs-Rails: None Entrance Stairs-Number of Steps: 2   Home Layout: One level        Prior Function Prior Level of Function : Independent/Modified Independent;Driving             Mobility Comments: Independent without AD, enjoys lawn care       Hand Dominance  Extremity/Trunk Assessment   Upper Extremity Assessment Upper Extremity Assessment: Overall WFL for tasks assessed    Lower Extremity Assessment Lower Extremity Assessment: LLE deficits/detail LLE Deficits / Details: 3-/5 knee extension in sitting, intact sensation to light touch & intact proprioception but pt reports LLE is still "not awake"        Communication      Cognition Arousal/Alertness: Awake/alert Behavior During Therapy: WFL for tasks assessed/performed Overall Cognitive Status: Within Functional Limits for tasks assessed                                          General Comments      Exercises Total Joint Exercises Ankle Circles/Pumps: AROM, Both, 10 reps, Supine Quad Sets: AROM, Supine, Strengthening, Left, 10 reps Short Arc Quad: AROM, Strengthening, Left, 10 reps, Supine Heel Slides: AROM, Strengthening, Left, 10 reps Hip ABduction/ADduction: AROM, Supine, Strengthening, Left, 10 reps (hip abduction slides) Straight Leg Raises: AAROM, Strengthening, Left, 10 reps, Supine Long Arc Quad: AROM, Seated, Strengthening, Left, 10 reps Knee Flexion: AROM, Left, 10 reps, Seated Goniometric ROM: 0 degrees extension, 90 degrees flexion   Assessment/Plan    PT Assessment Patient needs continued PT services  PT Problem List Decreased strength;Decreased balance;Decreased mobility;Decreased safety awareness;Decreased knowledge of use of DME;Decreased activity tolerance;Impaired sensation       PT Treatment Interventions DME instruction;Therapeutic exercise;Balance training;Gait training;Neuromuscular re-education;Stair training;Functional mobility training;Therapeutic activities;Patient/family education;Modalities;Manual techniques    PT Goals (Current goals can be found in the Care Plan section)  Acute Rehab PT Goals Patient Stated Goal: return to PLOF PT Goal Formulation: With patient Time For Goal Achievement: 09/16/22 Potential to Achieve Goals: Good    Frequency BID     Co-evaluation               AM-PAC PT "6 Clicks" Mobility  Outcome Measure Help needed turning from your back to your side while in a flat bed without using bedrails?: None Help needed moving from lying on your back to sitting on the side of a flat bed without using bedrails?: A Little Help needed moving to and from a  bed to a chair (including a wheelchair)?: A Little Help needed standing up from a chair using your arms (e.g., wheelchair or bedside chair)?: A Little Help needed to walk in hospital room?: A Lot Help needed climbing 3-5 steps with a railing? : Total 6 Click Score: 16    End of Session   Activity Tolerance: Patient tolerated treatment well Patient left: in chair;with family/visitor present;with SCD's reapplied (polar care donned LLE, SCD on RLE) Nurse Communication: Mobility status PT Visit Diagnosis: Muscle weakness (generalized) (M62.81);Difficulty in walking, not elsewhere classified (R26.2);Unsteadiness on feet (R26.81)    Time: 8527-7824 PT Time Calculation (min) (ACUTE ONLY): 24 min   Charges:   PT Evaluation $PT Eval Low Complexity: 1 Low PT Treatments $Therapeutic Exercise: 8-22 mins        Lavone Nian, PT, DPT 09/02/22, 3:52 PM   Waunita Schooner 09/02/2022, 3:51 PM

## 2022-09-02 NOTE — Interval H&P Note (Signed)
Patient history and physical updated. Consent reviewed including risks, benefits, and alternatives to surgery. Patient agrees with above plan to proceed with left total knee arthroplasty.  

## 2022-09-02 NOTE — Anesthesia Preprocedure Evaluation (Signed)
Anesthesia Evaluation  Patient identified by MRN, date of birth, ID band Patient awake    Reviewed: Allergy & Precautions, NPO status , Patient's Chart, lab work & pertinent test results  History of Anesthesia Complications Negative for: history of anesthetic complications  Airway Mallampati: III  TM Distance: >3 FB Neck ROM: Full    Dental  (+) Missing, Poor Dentition, Partial Lower   Pulmonary sleep apnea , neg COPD, Patient abstained from smoking.Not current smoker Possible OSA, test in a few weeks   Pulmonary exam normal breath sounds clear to auscultation       Cardiovascular Exercise Tolerance: Good METShypertension, Pt. on medications and Pt. on home beta blockers + Past MI (2017 with cardiac arrest), + Cardiac Stents (11/2019), + CABG (2010 x 5) and + Peripheral Vascular Disease (s/p endovascular stenting and repair 08/19/2020.)  (-) CAD and (-) CHF Normal cardiovascular exam+ dysrhythmias (PAF (paroxysmal atrial fibrillation)) + pacemaker (s/p PPM placement 05/26/2020) + Valvular Problems/Murmurs (s/p TAVR 2021)  Rhythm:Regular Rate:Normal  S/P AVR  ECHO: TTE 06/03/2021: EF >55%; severe LA enlargement; bioprosthetic AoV well-seated with a mean transvalvular gradient of 11 mmHg; G2DD   Neuro/Psych neg Seizures negative neurological ROS  negative psych ROS   GI/Hepatic Neg liver ROS,neg GERD  ,,  Endo/Other  neg diabetes    Renal/GU Renal diseaseLeft Ureteral Stone, Bilateral Kidney stone     Musculoskeletal  (+) Arthritis ,    Abdominal  (+) + obese  Peds  Hematology   Anesthesia Other Findings Past Medical History: No date: 2nd degree AV block     Comment:  a.) s/p PPM placement 05/26/2020 No date: Adrenal adenoma, left     Comment:  a.) CT 05/01/2021: measured 10m No date: Aortic atherosclerosis (HCC) No date: Arthritis 05/01/2021: Bilateral renal cysts No date: BPH (benign prostatic  hyperplasia) 09/23/2008: Cardiac arrest with ventricular fibrillation ([facility]     Comment:  a.) in setting of acute inferior STEMI; 2 episodes               requiring a single defibrillation each time prior to               ROSC. No date: Chlamydia No date: Coronary artery disease No date: Diastolic dysfunction     Comment:  a.)  TTE 06/03/2021: EF >55%; severe LA enlargement;               bioprosthetic AoV well-seated with a mean transvalvular               gradient of 11 mmHg; G2DD. No date: Excessive daytime sleepiness No date: Heart murmur No date: History of kidney stones 05/14/2020: History of transcatheter aortic valve replacement (TAVR)     Comment:  a.) 23 mm Edwards Sapien S3 Ultra bioprosthetic valve No date: Hypercholesteremia No date: Hypertension 06/30/2020: Iliac artery aneurysm ([facility]     Comment:  a.) CTA 06/30/2020: RIGHT common iliac artery thrombsed               and measured 3.8 cm, LEFT common iliac artery measure 4.6              cm, RIGHT internal iliac artery measure 4.2 cm; s/p               endovascular stenting and repair 08/19/2020. No date: Long term current use of anticoagulant     Comment:  a.) apixaban No date: Long term current use of antithrombotics/antiplatelets     Comment:  a.)  DAPT (ASA + clopidogrel) 03/15/2016: NSTEMI (non-ST elevated myocardial infarction) [facility])     Comment:  a.)  LHC 03/16/2016: 100% pRCA, 100% pLAD, 100% RI, 85%               dLCx-1, 100% dLCx-2; LIMA-LAD and SVG-OM 3 bypass grafts               patent.  60% stenosis of the SVG-RCA, 60% stenosis of the              SVG-D1, and total occlusion with significant thrombus               noted within the SVG-OM1 bypass graft. No date: PAF (paroxysmal atrial fibrillation) (HCC)     Comment:  a.) CHA2DS2-VASc = 3 (age, HTN, previous MI). b.)               Rate/rhythm maintained on daily metoprolol; carhnically               anticoagulated with apixaban + ASA +  clopidogrel 05/26/2020: Presence of permanent cardiac pacemaker     Comment:  a.) Medtronic device 09/23/2008: S/P CABG x 5     Comment:  a.) LIMA-LAD, SVG-D1, SVG-OM1, SVG-OM3, SVG-PDA No date: Severe aortic stenosis     Comment:  a.) s/p TAVR 05/14/2020 09/23/2008: ST elevation myocardial infarction (STEMI) of inferior  wall (HCC)     Comment:  a.) VF arrest x 2; ROSC with defibrillation x 1 each               time. LHC --> 70% pRCA, 30% oLM, 30% LM, 99% mLCx, 50%               dLCx, 90% OM1, 20% OM3, 30% LPL1, 20% pLAD, 30% mLAD-1,               80% mLAD-2, 90% D2; refer to CVTS. Underwent 5v CABG               09/23/2008.  Reproductive/Obstetrics                              Anesthesia Physical Anesthesia Plan  ASA: 3  Anesthesia Plan: Spinal   Post-op Pain Management: Ofirmev IV (intra-op)*   Induction: Intravenous  PONV Risk Score and Plan: 1 and Ondansetron, Dexamethasone, Propofol infusion, TIVA and Midazolam  Airway Management Planned: Natural Airway  Additional Equipment: None  Intra-op Plan:   Post-operative Plan:   Informed Consent: I have reviewed the patients History and Physical, chart, labs and discussed the procedure including the risks, benefits and alternatives for the proposed anesthesia with the patient or authorized representative who has indicated his/her understanding and acceptance.       Plan Discussed with: CRNA and Surgeon  Anesthesia Plan Comments: (Patient has been off eliquis for 4 days. Appropriate for spinal. Discussed R/B/A of neuraxial anesthesia technique with patient: - rare risks of spinal/epidural hematoma, nerve damage, infection - Risk of PDPH - Risk of nausea and vomiting - Risk of conversion to general anesthesia and its associated risks, including sore throat, damage to lips/eyes/teeth/oropharynx, and rare risks such as cardiac and respiratory events. - Risk of allergic reactions  Discussed the  role of CRNA in patient's perioperative care.  Patient voiced understanding.)         Anesthesia Quick Evaluation

## 2022-09-03 ENCOUNTER — Encounter: Payer: Self-pay | Admitting: Orthopedic Surgery

## 2022-09-03 DIAGNOSIS — M1712 Unilateral primary osteoarthritis, left knee: Secondary | ICD-10-CM | POA: Diagnosis not present

## 2022-09-03 DIAGNOSIS — Z7982 Long term (current) use of aspirin: Secondary | ICD-10-CM | POA: Diagnosis not present

## 2022-09-03 DIAGNOSIS — I2510 Atherosclerotic heart disease of native coronary artery without angina pectoris: Secondary | ICD-10-CM | POA: Diagnosis not present

## 2022-09-03 DIAGNOSIS — M174 Other bilateral secondary osteoarthritis of knee: Secondary | ICD-10-CM | POA: Diagnosis not present

## 2022-09-03 DIAGNOSIS — Z7902 Long term (current) use of antithrombotics/antiplatelets: Secondary | ICD-10-CM | POA: Diagnosis not present

## 2022-09-03 DIAGNOSIS — Z951 Presence of aortocoronary bypass graft: Secondary | ICD-10-CM | POA: Diagnosis not present

## 2022-09-03 DIAGNOSIS — Z7901 Long term (current) use of anticoagulants: Secondary | ICD-10-CM | POA: Diagnosis not present

## 2022-09-03 DIAGNOSIS — I480 Paroxysmal atrial fibrillation: Secondary | ICD-10-CM | POA: Diagnosis not present

## 2022-09-03 DIAGNOSIS — Z79899 Other long term (current) drug therapy: Secondary | ICD-10-CM | POA: Diagnosis not present

## 2022-09-03 LAB — BASIC METABOLIC PANEL
Anion gap: 8 (ref 5–15)
BUN: 29 mg/dL — ABNORMAL HIGH (ref 8–23)
CO2: 23 mmol/L (ref 22–32)
Calcium: 8.4 mg/dL — ABNORMAL LOW (ref 8.9–10.3)
Chloride: 103 mmol/L (ref 98–111)
Creatinine, Ser: 1.15 mg/dL (ref 0.61–1.24)
GFR, Estimated: >60 mL/min (ref >60)
Glucose, Bld: 150 mg/dL — ABNORMAL HIGH (ref 70–99)
Potassium: 3.5 mmol/L (ref 3.5–5.1)
Sodium: 134 mmol/L — ABNORMAL LOW (ref 135–145)

## 2022-09-03 LAB — CBC
HCT: 34.1 % — ABNORMAL LOW (ref 39.0–52.0)
Hemoglobin: 11.8 g/dL — ABNORMAL LOW (ref 13.0–17.0)
MCH: 30.5 pg (ref 26.0–34.0)
MCHC: 34.6 g/dL (ref 30.0–36.0)
MCV: 88.1 fL (ref 80.0–100.0)
Platelets: 150 10*3/uL (ref 150–400)
RBC: 3.87 MIL/uL — ABNORMAL LOW (ref 4.22–5.81)
RDW: 12.8 % (ref 11.5–15.5)
WBC: 10 10*3/uL (ref 4.0–10.5)
nRBC: 0 % (ref 0.0–0.2)

## 2022-09-03 MED ORDER — ACETAMINOPHEN 500 MG PO TABS: 1000.0000 mg | ORAL_TABLET | Freq: Three times a day (TID) | ORAL | 0 refills | Status: DC

## 2022-09-03 MED ORDER — CEFAZOLIN SODIUM-DEXTROSE 2-4 GM/100ML-% IV SOLN
INTRAVENOUS | Status: AC
  Administered 2022-09-02: 2 g via INTRAVENOUS
  Filled 2022-09-03: qty 100

## 2022-09-03 MED ORDER — AMLODIPINE BESYLATE 5 MG PO TABS
ORAL_TABLET | ORAL | Status: AC
  Filled 2022-09-03: qty 2

## 2022-09-03 MED ORDER — DOCUSATE SODIUM 100 MG PO CAPS
ORAL_CAPSULE | ORAL | Status: AC
  Filled 2022-09-03: qty 1

## 2022-09-03 MED ORDER — KETOROLAC TROMETHAMINE 15 MG/ML IJ SOLN
INTRAMUSCULAR | Status: AC
  Filled 2022-09-03: qty 1

## 2022-09-03 MED ORDER — ONDANSETRON HCL 4 MG PO TABS: 4.0000 mg | ORAL_TABLET | Freq: Four times a day (QID) | ORAL | 0 refills | Status: DC | PRN

## 2022-09-03 MED ORDER — APIXABAN 2.5 MG PO TABS
ORAL_TABLET | ORAL | Status: AC
  Filled 2022-09-03: qty 2

## 2022-09-03 MED ORDER — LOSARTAN POTASSIUM 50 MG PO TABS
ORAL_TABLET | ORAL | Status: AC
  Filled 2022-09-03: qty 2

## 2022-09-03 MED ORDER — ACETAMINOPHEN 500 MG PO TABS
ORAL_TABLET | ORAL | Status: AC
  Administered 2022-09-02: 1000 mg via ORAL
  Filled 2022-09-03: qty 2

## 2022-09-03 MED ORDER — METOPROLOL TARTRATE 25 MG PO TABS
ORAL_TABLET | ORAL | Status: AC
  Filled 2022-09-03: qty 1

## 2022-09-03 MED ORDER — DOCUSATE SODIUM 100 MG PO CAPS: 100.0000 mg | ORAL_CAPSULE | Freq: Two times a day (BID) | ORAL | 0 refills | Status: DC

## 2022-09-03 MED ORDER — PANTOPRAZOLE SODIUM 40 MG PO TBEC
DELAYED_RELEASE_TABLET | ORAL | Status: AC
  Administered 2022-09-03: 40 mg via ORAL
  Filled 2022-09-03: qty 1

## 2022-09-03 MED ORDER — TRAMADOL HCL 50 MG PO TABS: 50.0000 mg | ORAL_TABLET | Freq: Four times a day (QID) | ORAL | 0 refills | Status: DC | PRN

## 2022-09-03 MED ORDER — ROSUVASTATIN CALCIUM 20 MG PO TABS
ORAL_TABLET | ORAL | Status: AC
  Filled 2022-09-03: qty 2

## 2022-09-03 MED ORDER — KETOROLAC TROMETHAMINE 15 MG/ML IJ SOLN
INTRAMUSCULAR | Status: AC
  Administered 2022-09-03: 7.5 mg via INTRAVENOUS
  Filled 2022-09-03: qty 1

## 2022-09-03 MED ORDER — ACETAMINOPHEN 500 MG PO TABS
ORAL_TABLET | ORAL | Status: AC
  Filled 2022-09-03: qty 2

## 2022-09-03 MED ORDER — CELECOXIB 100 MG PO CAPS: 100.0000 mg | ORAL_CAPSULE | Freq: Two times a day (BID) | ORAL | 0 refills | Status: AC

## 2022-09-03 MED ORDER — ASPIRIN 81 MG PO CHEW
CHEWABLE_TABLET | ORAL | Status: AC
  Filled 2022-09-03: qty 1

## 2022-09-03 NOTE — Progress Notes (Signed)
Physical Therapy Treatment Patient Details Name: Joseph Schmitt MRN: [id number] DOB: Jun 10, 1956 Today's Date: 09/03/2022   History of Present Illness Pt is a 67 y/o M admitted on 09/02/22 for scheduled L TKA. PMH: CAD, cardiac murmur, HLD, HTN, MI, STEMI    PT Comments    Pt seen for PT tx with pt agreeable. Pt is able to complete bed mobility with independence, STS from various surfaces with mod I & RW & good recall of safe hand placement. Pt ambulates increased distances with RW & CGA fade to supervision. Pt negotiates 8 steps with rails (pt reports he can hold to door frame for support) with CGA fade to supervision with good ability to demo step to pattern after PT educates him on compensatory pattern. Pt is cleared to d/c home with HHPT - team made aware.    Recommendations for follow up therapy are one component of a multi-disciplinary discharge planning process, led by the attending physician.  Recommendations may be updated based on patient status, additional functional criteria and insurance authorization.  Follow Up Recommendations  Home health PT     Assistance Recommended at Discharge Intermittent Supervision/Assistance  Patient can return home with the following A little help with walking and/or transfers;A little help with bathing/dressing/bathroom;Assist for transportation;Assistance with cooking/housework;Help with stairs or ramp for entrance   Equipment Recommendations  Rolling walker (2 wheels)    Recommendations for Other Services       Precautions / Restrictions Precautions Precautions: Fall Restrictions Weight Bearing Restrictions: Yes LLE Weight Bearing: Weight bearing as tolerated     Mobility  Bed Mobility Overal bed mobility: Independent Bed Mobility: Sidelying to Sit   Sidelying to sit: Independent            Transfers Overall transfer level: Modified independent Equipment used: Rolling walker (2 wheels)   Sit to Stand: Modified independent  (Device/Increase time)           General transfer comment: STS from EOB & BSC with mod I with good awareness of hand placement & safe use of RW    Ambulation/Gait Ambulation/Gait assistance: Min guard, Supervision (CGA fade to supervision) Gait Distance (Feet):  (150 ft + 150 ft) Assistive device: Rolling walker (2 wheels)   Gait velocity: slightly decreased     General Gait Details: Cuing for upright posture & limit leaning on RW, pt progressing to more fluid gait pattern.   Stairs Stairs: Yes Stairs assistance: Supervision, Min guard Stair Management: Two rails, Step to pattern Number of Stairs: 8 General stair comments: CGA fade to supervision, educational cuing re: compensatory pattern with pt able to return demo   Wheelchair Mobility    Modified Rankin (Stroke Patients Only)       Balance Overall balance assessment: Needs assistance Sitting-balance support: Feet supported Sitting balance-Leahy Scale: Good     Standing balance support: During functional activity, No upper extremity supported Standing balance-Leahy Scale: Fair                              Cognition Arousal/Alertness: Awake/alert Behavior During Therapy: WFL for tasks assessed/performed Overall Cognitive Status: Within Functional Limits for tasks assessed                                          Exercises      General Comments General comments (skin  integrity, edema, etc.): Educated pt on use of polar care      Pertinent Vitals/Pain Pain Assessment Pain Assessment: No/denies pain    Home Living                          Prior Function            PT Goals (current goals can now be found in the care plan section) Acute Rehab PT Goals Patient Stated Goal: return to PLOF PT Goal Formulation: With patient Time For Goal Achievement: 09/16/22 Potential to Achieve Goals: Good Progress towards PT goals: Progressing toward goals     Frequency    BID      PT Plan Current plan remains appropriate    Co-evaluation              AM-PAC PT "6 Clicks" Mobility   Outcome Measure  Help needed turning from your back to your side while in a flat bed without using bedrails?: None Help needed moving from lying on your back to sitting on the side of a flat bed without using bedrails?: None Help needed moving to and from a bed to a chair (including a wheelchair)?: None Help needed standing up from a chair using your arms (e.g., wheelchair or bedside chair)?: None Help needed to walk in hospital room?: A Little Help needed climbing 3-5 steps with a railing? : A Little 6 Click Score: 22    End of Session   Activity Tolerance: Patient tolerated treatment well Patient left: in bed;with call bell/phone within reach (polar care donned to L knee) Nurse Communication: Mobility status PT Visit Diagnosis: Muscle weakness (generalized) (M62.81);Difficulty in walking, not elsewhere classified (R26.2)     Time: [id number] PT Time Calculation (min) (ACUTE ONLY): 16 min  Charges:  $Gait Training: 8-22 mins                     Lavone Nian, PT, DPT 09/03/22, 9:42 AM   Waunita Schooner 09/03/2022, 9:40 AM

## 2022-09-03 NOTE — Evaluation (Signed)
Occupational Therapy Evaluation Patient Details Name: Joseph Schmitt MRN: [id number] DOB: 06-Sep-1955 Today's Date: 09/03/2022   History of Present Illness Pt is a 67 y/o M admitted on 09/02/22 for scheduled L TKA. PMH: CAD, cardiac murmur, HLD, HTN, MI, STEMI   Clinical Impression   Joseph Schmitt was seen for OT evaluation this date. Prior to hospital admission, pt was IND. Pt lives with spouse. Pt presents to acute OT demonstrating impaired ADL performance and functional mobility 2/2 decreased activity tolerance. Pt currently requires SUP in room mobility no AD use. MOD I don socks, requires MOD A for compression socks. Pt instructed in polar care mgt, falls prevention strategies, and compression stocking mgt. All education complete, will sign off. Upon hospital discharge, no follow up.   Recommendations for follow up therapy are one component of a multi-disciplinary discharge planning process, led by the attending physician.  Recommendations may be updated based on patient status, additional functional criteria and insurance authorization.   Follow Up Recommendations  No OT follow up     Assistance Recommended at Discharge Set up Supervision/Assistance  Patient can return home with the following Help with stairs or ramp for entrance    Functional Status Assessment  Patient has had a recent decline in their functional status and demonstrates the ability to make significant improvements in function in a reasonable and predictable amount of time.  Equipment Recommendations  None recommended by OT    Recommendations for Other Services       Precautions / Restrictions Precautions Precautions: Fall Restrictions Weight Bearing Restrictions: Yes LLE Weight Bearing: Weight bearing as tolerated      Mobility Bed Mobility Overal bed mobility: Independent                  Transfers Overall transfer level: Needs assistance Equipment used: None   Sit to Stand: Supervision                   Balance Overall balance assessment: No apparent balance deficits (not formally assessed)                                         ADL either performed or assessed with clinical judgement   ADL Overall ADL's : Needs assistance/impaired                                       General ADL Comments: MOD I don B socks seated EOB, MOD A don compression socks. SUPERVISION toilet t/f      Pertinent Vitals/Pain Pain Assessment Pain Assessment: No/denies pain     Hand Dominance     Extremity/Trunk Assessment Upper Extremity Assessment Upper Extremity Assessment: Overall WFL for tasks assessed   Lower Extremity Assessment Lower Extremity Assessment: Overall WFL for tasks assessed       Communication Communication Communication: No difficulties   Cognition Arousal/Alertness: Awake/alert Behavior During Therapy: WFL for tasks assessed/performed Overall Cognitive Status: Within Functional Limits for tasks assessed                                       General Comments  educated on polar care use            Home Living  Family/patient expects to be discharged to:: Private residence Living Arrangements: Spouse/significant other Available Help at Discharge: Family;Available 24 hours/day Type of Home: House Home Access: Stairs to enter [employer] of Steps: 2 Entrance Stairs-Rails: None Home Layout: One level                          Prior Functioning/Environment Prior Level of Function : Independent/Modified Independent;Driving             Mobility Comments: Independent without AD, enjoys lawn care          OT Problem List: Decreased range of motion         OT Goals(Current goals can be found in the care plan section) Acute Rehab OT Goals Patient Stated Goal: to go home OT Goal Formulation: With patient Time For Goal Achievement: 09/17/22 Potential to Achieve Goals:  Good   AM-PAC OT "6 Clicks" Daily Activity     Outcome Measure Help from another person eating meals?: None Help from another person taking care of personal grooming?: None Help from another person toileting, which includes using toliet, bedpan, or urinal?: None Help from another person bathing (including washing, rinsing, drying)?: A Little Help from another person to put on and taking off regular upper body clothing?: None Help from another person to put on and taking off regular lower body clothing?: A Little 6 Click Score: 22   End of Session    Activity Tolerance: Patient tolerated treatment well Patient left: in bed  OT Visit Diagnosis: Unsteadiness on feet (R26.81)                Time: [id number] OT Time Calculation (min): 8 min Charges:  OT General Charges $OT Visit: 1 Visit OT Evaluation $OT Eval Low Complexity: 1 Low  Joseph Schmitt, M.S. OTR/L  09/03/22, 9:58 AM  ascom [phone]

## 2022-09-03 NOTE — Anesthesia Postprocedure Evaluation (Signed)
Anesthesia Post Note  Patient: Joseph Schmitt  Procedure(s) Performed: TOTAL KNEE ARTHROPLASTY (Left: Knee)  Patient location during evaluation: Nursing Unit Anesthesia Type: Spinal Level of consciousness: oriented and awake and alert Pain management: pain level controlled Vital Signs Assessment: post-procedure vital signs reviewed and stable Respiratory status: spontaneous breathing and respiratory function stable Cardiovascular status: blood pressure returned to baseline and stable Postop Assessment: no headache, no backache, no apparent nausea or vomiting and patient able to bend at knees Anesthetic complications: no   No notable events documented.   Last Vitals:  Vitals:   09/02/22 1914 09/03/22 0627  BP: (!) 168/87 (!) 164/79  Pulse: 69 71  Resp: 18 16  Temp: 36.6 C 36.7 C  SpO2: 96% 95%    Last Pain:  Vitals:   09/03/22 0715  TempSrc:   PainSc: 3                  Scheryl Sanborn

## 2022-09-03 NOTE — Discharge Instructions (Signed)
Instructions after Total Knee Replacement   Steffanie Rainwater M.D.     Dept. of [facility]  [place], [place]  [address]  Phone: [phone]   Fax: [phone]    DIET: Drink plenty of non-alcoholic fluids. Resume your normal diet. Include foods high in fiber.  ACTIVITY:  You may use crutches or a walker with weight-bearing as tolerated, unless instructed otherwise. You may be weaned off of the walker or crutches by your Physical Therapist.  Do NOT place pillows under the knee. Anything placed under the knee could limit your ability to straighten the knee.   Continue doing gentle exercises. Exercising will reduce the pain and swelling, increase motion, and prevent muscle weakness.   Please continue to use the TED compression stockings for 2 weeks. You may remove the stockings at night, but should reapply them in the morning. Do not drive or operate any equipment until instructed.  WOUND CARE:  Continue to use the PolarCare or ice packs periodically to reduce pain and swelling. You may begin showering 3 days after surgery with honeycomb dressing. Remove honeycomb dressing 7 days after surgery and continue showering. Allow dermabond to fall off on its own.  MEDICATIONS: You may resume your regular medications. Please take the pain medication as prescribed on the medication. Do not take pain medication on an empty stomach. Continue with Eliquis  Do not drive or drink alcoholic beverages when taking pain medications.  POSTOPERATIVE CONSTIPATION PROTOCOL Constipation - defined medically as fewer than three stools per week and severe constipation as less than one stool per week.  One of the most common issues patients have following surgery is constipation.  Even if you have a regular bowel pattern at home, your normal regimen is likely to be disrupted due to multiple reasons following surgery.  Combination of anesthesia,  postoperative narcotics, change in appetite and fluid intake all can affect your bowels.  In order to avoid complications following surgery, here are some recommendations in order to help you during your recovery period.  Colace (docusate) - Pick up an over-the-counter form of Colace or another stool softener and take twice a day as long as you are requiring postoperative pain medications.  Take with a full glass of water daily.  If you experience loose stools or diarrhea, hold the colace until you stool forms back up.  If your symptoms do not get better within 1 week or if they get worse, check with your doctor.  Dulcolax (bisacodyl) - Pick up over-the-counter and take as directed by the product packaging as needed to assist with the movement of your bowels.  Take with a full glass of water.  Use this product as needed if not relieved by Colace only.   MiraLax (polyethylene glycol) - Pick up over-the-counter to have on hand.  MiraLax is a solution that will increase the amount of water in your bowels to assist with bowel movements.  Take as directed and can mix with a glass of water, juice, soda, coffee, or tea.  Take if you go more than two days without a movement. Do not use MiraLax more than once per day. Call your doctor if you are still constipated or irregular after using this medication for 7 days in a row.  If you continue to have problems with postoperative constipation, please contact the office for further assistance and recommendations.  If you experience "the worst abdominal pain ever" or develop nausea or  vomiting, please contact the office immediatly for further recommendations for treatment.   CALL THE OFFICE FOR: Temperature above 101 degrees Excessive bleeding or drainage on the dressing. Excessive swelling, coldness, or paleness of the toes. Persistent nausea and vomiting.  FOLLOW-UP:  You should have an appointment to return to the office in 14 days after  surgery. Arrangements have been made for continuation of Physical Therapy (either home therapy or outpatient therapy).

## 2022-09-03 NOTE — Progress Notes (Addendum)
Subjective: 1 Day Post-Op Procedure(s) (LRB): TOTAL KNEE ARTHROPLASTY (Left) Patient reports pain as mild.   Patient is well, and has had no acute complaints or problems Denies any CP, SOB, ABD pain. We will continue therapy today.  Plan is to go Home after hospital stay.  Objective: Vital signs in last 24 hours: Temp:  [97.8 F (36.6 C)-98.1 F (36.7 C)] 98.1 F (36.7 C) (02/09 0627) Pulse Rate:  [62-73] 71 (02/09 0627) Resp:  [16-29] 16 (02/09 0627) BP: (83-175)/(51-87) 164/79 (02/09 0627) SpO2:  [92 %-100 %] 95 % (02/09 0627) Weight:  [117.7 kg] 117.7 kg (02/08 0905)  Intake/Output from previous day: 02/08 0701 - 02/09 0700 In: 2140 [P.O.:440; I.V.:1400; IV Piggyback:300] Out: 850 [Urine:775; Blood:75] Intake/Output this shift: No intake/output data recorded.  Recent Labs    09/03/22 0550  HGB 11.8*   Recent Labs    09/03/22 0550  WBC 10.0  RBC 3.87*  HCT 34.1*  PLT 150   Recent Labs    09/03/22 0550  NA 134*  K 3.5  CL 103  CO2 23  BUN 29*  CREATININE 1.15  GLUCOSE 150*  CALCIUM 8.4*   No results for input(s): "LABPT", "INR" in the last 72 hours.  EXAM General - Patient is Alert, Appropriate, and Oriented Extremity - Neurovascular intact Sensation intact distally Intact pulses distally Dorsiflexion/Plantar flexion intact No cellulitis present Compartment soft Dressing - dressing C/D/I and no drainage Motor Function - intact, moving foot and toes well on exam.   Past Medical History:  Diagnosis Date   2nd degree AV block    a.) s/p PPM placement 05/26/2020   Adrenal adenoma, left    a.) CT 05/01/2021: measured 43m   Aortic atherosclerosis ([facility]    Arthritis    Bilateral renal cysts 05/01/2021   BPH (benign prostatic hyperplasia)    Cardiac arrest with ventricular fibrillation ([facility] 09/23/2008   a.) in setting of acute inferior STEMI; 2 episodes requiring a single defibrillation each time prior to ROSC.   Chlamydia    Coronary artery  disease    Diastolic dysfunction    a.)  TTE 06/03/2021: EF >55%; severe LA enlargement; bioprosthetic AoV well-seated with a mean transvalvular gradient of 11 mmHg; G2DD.   Excessive daytime sleepiness    Heart murmur    History of kidney stones    History of transcatheter aortic valve replacement (TAVR) 05/14/2020   a.) 23 mm Edwards Sapien S3 Ultra bioprosthetic valve   Hypercholesteremia    Hypertension    Iliac artery aneurysm ([facility] 06/30/2020   a.) CTA 06/30/2020: RIGHT common iliac artery thrombsed and measured 3.8 cm, LEFT common iliac artery measure 4.6 cm, RIGHT internal iliac artery measure 4.2 cm; s/p endovascular stenting and repair 08/19/2020.   Long term current use of anticoagulant    a.) apixaban   Long term current use of antithrombotics/antiplatelets    a.) DAPT (ASA + clopidogrel)   NSTEMI (non-ST elevated myocardial infarction) ([facility] 03/15/2016   a.)  LHC 03/16/2016: 100% pRCA, 100% pLAD, 100% RI, 85% dLCx-1, 100% dLCx-2; LIMA-LAD and SVG-OM 3 bypass grafts patent.  60% stenosis of the SVG-RCA, 60% stenosis of the SVG-D1, and total occlusion with significant thrombus noted within the SVG-OM1 bypass graft.   PAF (paroxysmal atrial fibrillation) (HCC)    a.) CHA2DS2-VASc = 3 (age, HTN, previous MI). b.) Rate/rhythm maintained on daily metoprolol; carhnically anticoagulated with apixaban + ASA + clopidogrel   Presence of permanent cardiac pacemaker 05/26/2020   a.) Medtronic device  S/P CABG x 5 09/23/2008   a.) LIMA-LAD, SVG-D1, SVG-OM1, SVG-OM3, SVG-PDA   Severe aortic stenosis    a.) s/p TAVR 05/14/2020   ST elevation myocardial infarction (STEMI) of inferior wall ([place]) 09/23/2008   a.) VF arrest x 2; ROSC with defibrillation x 1 each time. LHC --> 70% pRCA, 30% oLM, 30% LM, 99% mLCx, 50% dLCx, 90% OM1, 20% OM3, 30% LPL1, 20% pLAD, 30% mLAD-1, 80% mLAD-2, 90% D2; refer to CVTS. Underwent 5v CABG 09/23/2008.    Assessment/Plan:   1 Day Post-Op Procedure(s)  (LRB): TOTAL KNEE ARTHROPLASTY (Left) Principal Problem:   S/P TKR (total knee replacement) using cement, left  Estimated body mass index is 36.19 kg/m as calculated from the following:   Height as of this encounter: 5' 11"$  (1.803 m).   Weight as of this encounter: 117.7 kg. Advance diet Up with therapy Pain well-controlled Labs and vital signs are stable Overall patient doing well with no complaints Care management to assist with discharge to home with home health PT today pending safe completion of PT goals  DVT Prophylaxis - Lovenox, TED hose, and SCDs Weight-Bearing as tolerated to left leg   T. Rachelle Hora, PA-C [place] 09/03/2022, 7:53 AM   Patient seen and examined, agree with above plan.  The patient is doing well status post left total knee arthroplasty, no concerns at this time.  Pain is controlled.  Discussed DVT prophylaxis, pain medication use, and safe transition to home.  All questions answered the patient agrees with above plan will go home after clears PT.   Steffanie Rainwater MD

## 2022-09-03 NOTE — Discharge Summary (Signed)
Physician Discharge Summary  Patient ID: Joseph Schmitt MRN: [id number] DOB/AGE: 10/16/1955 67 y.o.  Admit date: 09/02/2022 Discharge date: 09/03/2022  Admission Diagnoses:  S/P TKR (total knee replacement) using cement, left [Z96.652]   Discharge Diagnoses: Patient Active Problem List   Diagnosis Date Noted   S/P TKR (total knee replacement) using cement, left 09/02/2022   Sepsis due to gram-negative UTI ([place]) 08/10/2021   Syncope 03/17/2016   Elevated troponin 03/15/2016    Past Medical History:  Diagnosis Date   2nd degree AV block    a.) s/p PPM placement 05/26/2020   Adrenal adenoma, left    a.) CT 05/01/2021: measured 24m   Aortic atherosclerosis ([facility]    Arthritis    Bilateral renal cysts 05/01/2021   BPH (benign prostatic hyperplasia)    Cardiac arrest with ventricular fibrillation ([facility] 09/23/2008   a.) in setting of acute inferior STEMI; 2 episodes requiring a single defibrillation each time prior to ROSC.   Chlamydia    Coronary artery disease    Diastolic dysfunction    a.)  TTE 06/03/2021: EF >55%; severe LA enlargement; bioprosthetic AoV well-seated with a mean transvalvular gradient of 11 mmHg; G2DD.   Excessive daytime sleepiness    Heart murmur    History of kidney stones    History of transcatheter aortic valve replacement (TAVR) 05/14/2020   a.) 23 mm Edwards Sapien S3 Ultra bioprosthetic valve   Hypercholesteremia    Hypertension    Iliac artery aneurysm ([facility] 06/30/2020   a.) CTA 06/30/2020: RIGHT common iliac artery thrombsed and measured 3.8 cm, LEFT common iliac artery measure 4.6 cm, RIGHT internal iliac artery measure 4.2 cm; s/p endovascular stenting and repair 08/19/2020.   Long term current use of anticoagulant    a.) apixaban   Long term current use of antithrombotics/antiplatelets    a.) DAPT (ASA + clopidogrel)   NSTEMI (non-ST elevated myocardial infarction) ([facility] 03/15/2016   a.)  LHC 03/16/2016: 100% pRCA, 100% pLAD, 100% RI, 85%  dLCx-1, 100% dLCx-2; LIMA-LAD and SVG-OM 3 bypass grafts patent.  60% stenosis of the SVG-RCA, 60% stenosis of the SVG-D1, and total occlusion with significant thrombus noted within the SVG-OM1 bypass graft.   PAF (paroxysmal atrial fibrillation) (HCC)    a.) CHA2DS2-VASc = 3 (age, HTN, previous MI). b.) Rate/rhythm maintained on daily metoprolol; carhnically anticoagulated with apixaban + ASA + clopidogrel   Presence of permanent cardiac pacemaker 05/26/2020   a.) Medtronic device   S/P CABG x 5 09/23/2008   a.) LIMA-LAD, SVG-D1, SVG-OM1, SVG-OM3, SVG-PDA   Severe aortic stenosis    a.) s/p TAVR 05/14/2020   ST elevation myocardial infarction (STEMI) of inferior wall ([facility] 09/23/2008   a.) VF arrest x 2; ROSC with defibrillation x 1 each time. LHC --> 70% pRCA, 30% oLM, 30% LM, 99% mLCx, 50% dLCx, 90% OM1, 20% OM3, 30% LPL1, 20% pLAD, 30% mLAD-1, 80% mLAD-2, 90% D2; refer to CVTS. Underwent 5v CABG 09/23/2008.     Transfusion: None   Consultants (if any):   Discharged Condition: Improved  Hospital Course: Joseph Schmitt an 67y.o. male who was admitted 09/02/2022 with a diagnosis of S/P TKR (total knee replacement) using cement, left and went to the operating room on 09/02/2022 and underwent the above named procedures.    Surgeries: Procedure(s): TOTAL KNEE ARTHROPLASTY on 09/02/2022 Patient tolerated the surgery well. Taken to PACU where she was stabilized and then transferred to the orthopedic floor.  Started on Eliquis, TEDs and SCDs applied bilaterally.  Heels elevated on bed. No evidence of DVT. Negative [place]. Physical therapy started on day #1 for gait training and transfer. OT started day #1 for ADL and assisted devices. Patient was able to safely and independently complete all PT goals. PT recommending discharge to home. Patient's IV was d/c on day #1. On post op day #1 patient was stable and ready for discharge to home with home health PT.  Implants: Femur: Persona Size 10 CR    Tibia: Persona Size G w/14x46m stem extension  Poly: 1351mMC  Patella: 38 x 9.51m68mymmetric    He was given perioperative antibiotics:  Anti-infectives (From admission, onward)    Start     Dose/Rate Route Frequency Ordered Stop   09/02/22 1700  ceFAZolin (ANCEF) IVPB 2g/100 mL premix        2 g 200 mL/hr over 30 Minutes Intravenous Every 6 hours 09/02/22 1455 09/03/22 0029   09/02/22 0830  ceFAZolin (ANCEF) IVPB 2g/100 mL premix        2 g 200 mL/hr over 30 Minutes Intravenous On call to O.R. 09/02/22 [id number]/24 1058     .  He was given sequential compression devices, early ambulation, and Eliquis, teds for DVT prophylaxis.  He benefited maximally from the hospital stay and there were no complications.    Recent vital signs:  Vitals:   09/02/22 1914 09/03/22 0627  BP: (!) 168/87 (!) 164/79  Pulse: 69 71  Resp: 18 16  Temp: 97.8 F (36.6 C) 98.1 F (36.7 C)  SpO2: 96% 95%    Recent laboratory studies:  Lab Results  Component Value Date   HGB 11.8 (L) 09/03/2022   HGB 13.5 08/20/2022   HGB 10.9 (L) 08/12/2021   Lab Results  Component Value Date   WBC 10.0 09/03/2022   PLT 150 09/03/2022   Lab Results  Component Value Date   INR 1.4 (H) 08/11/2021   Lab Results  Component Value Date   NA 134 (L) 09/03/2022   K 3.5 09/03/2022   CL 103 09/03/2022   CO2 23 09/03/2022   BUN 29 (H) 09/03/2022   CREATININE 1.15 09/03/2022   GLUCOSE 150 (H) 09/03/2022    Discharge Medications:   Allergies as of 09/03/2022   No Known Allergies      Medication List     STOP taking these medications    HYDROcodone-acetaminophen 5-325 MG tablet Commonly known as: NORCO/VICODIN       TAKE these medications    acetaminophen 500 MG tablet Commonly known as: TYLENOL Take 1,000 mg by mouth every 6 (six) hours as needed. What changed: Another medication with the same name was added. Make sure you understand how and when to take each.   acetaminophen 500 MG  tablet Commonly known as: TYLENOL Take 2 tablets (1,000 mg total) by mouth every 8 (eight) hours. What changed: You were already taking a medication with the same name, and this prescription was added. Make sure you understand how and when to take each.   amLODipine 10 MG tablet Commonly known as: NORVASC Take 10 mg by mouth daily.   amoxicillin 500 MG capsule Commonly known as: AMOXIL Take 500 mg by mouth as needed. Take 4 tablets (2,000 mg total) by mouth as directed (Take 4 tablets equal to 2 g 1 hour before dental visits or other procedures likely to cause bacteremia.)   aspirin 81 MG chewable tablet Chew 1 tablet (81 mg total) by mouth daily.   celecoxib 100 MG  capsule Commonly known as: CeleBREX Take 1 capsule (100 mg total) by mouth 2 (two) times daily for 7 days.   chlorthalidone 25 MG tablet Commonly known as: HYGROTON Take 25 mg by mouth daily.   cholecalciferol 25 MCG (1000 UNIT) tablet Commonly known as: VITAMIN D3 Take 1,000 Units by mouth daily.   cyanocobalamin 1000 MCG tablet Take 1,000 mcg by mouth daily.   docusate sodium 100 MG capsule Commonly known as: COLACE Take 1 capsule (100 mg total) by mouth 2 (two) times daily.   Eliquis 5 MG Tabs tablet Generic drug: apixaban Take 5 mg by mouth 2 (two) times daily.   losartan 100 MG tablet Commonly known as: COZAAR Take 100 mg by mouth daily.   metoprolol tartrate 25 MG tablet Commonly known as: LOPRESSOR Take 25 mg by mouth 2 (two) times daily.   ondansetron 4 MG tablet Commonly known as: ZOFRAN Take 1 tablet (4 mg total) by mouth every 6 (six) hours as needed for nausea.   rosuvastatin 40 MG tablet Commonly known as: CRESTOR Take 40 mg by mouth daily.   tamsulosin 0.4 MG Caps capsule Commonly known as: Flomax Take 1 capsule (0.4 mg total) by mouth daily after supper.   traMADol 50 MG tablet Commonly known as: ULTRAM Take 1-2 tablets (50-100 mg total) by mouth every 6 (six) hours as needed for  moderate pain.               Durable Medical Equipment  (From admission, onward)           Start     Ordered   09/02/22 1555  For home use only DME Walker rolling  Once       Question Answer Comment  Walker: With 5 Inch Wheels   Patient needs a walker to treat with the following condition General weakness      09/02/22 1554            Diagnostic Studies: DG Knee Left Port  Result Date: 09/02/2022 CLINICAL DATA:  Status post total knee replacement. EXAM: PORTABLE LEFT KNEE - 1-2 VIEW COMPARISON:  None Available. FINDINGS: Left knee arthroplasty in expected alignment. No periprosthetic lucency or fracture. There has been patellar resurfacing. Recent postsurgical change includes air and edema in the soft tissues and joint space. Small surgical clips in the posteromedial soft tissues. Vascular calcifications are seen. IMPRESSION: Left knee arthroplasty without immediate postoperative complication. Electronically Signed   By: Keith Rake M.D.   On: 09/02/2022 13:55    Disposition:      Follow-up Information     Duanne Guess, PA-C Follow up in 2 week(s).   Specialties: Orthopedic Surgery, Emergency Medicine Contact information: [place] [address] [phone]                  Signed: Feliberto Gottron 09/03/2022, 8:02 AM

## 2022-09-03 NOTE — TOC Progression Note (Addendum)
Transition of Care [facility]) - Progression Note    Patient Details  Name: Joseph Schmitt MRN: [id number] Date of Birth: 11-30-1955  [facility] CM/SW [place], RN Phone Number: 09/03/2022, 10:23 AM  Clinical Narrative:   [employer] spoke to the patient's spouse and she declined stating he has one at home they reported The patient reports that he has not spoke to anyone I called and spoke to [place] at [facility] and explained that the patient reports that he needs the Rolling walker She reports that they had spoke to his spouse but they will call her back and will deliver the RW    Expected Discharge Plan: [place] Barriers to Discharge: No Barriers Identified  Expected Discharge Plan and Services   Discharge Planning Services: CM Consult   Living arrangements for the past 2 months: Single Family Home Expected Discharge Date: 09/03/22                 DME Agency: [employer] Date DME Agency Contacted: 09/03/22 Time DME Agency Contacted: [phone] Representative spoke with at DME Agency: [place]: PT [place] Agency: [place] Date [place]: 09/03/22 Time [place]: 0845 Representative spoke with at [place]: [place] ([place]) Interventions SDOH Screenings   Food Insecurity: No Food Insecurity (09/02/2022)  Housing: Low Risk  (09/02/2022)  Transportation Needs: No Transportation Needs (09/02/2022)  Utilities: Not At Risk (09/02/2022)  Tobacco Use: Low Risk  (09/02/2022)    Readmission Risk Interventions     No data to display

## 2022-09-05 DIAGNOSIS — I2510 Atherosclerotic heart disease of native coronary artery without angina pectoris: Secondary | ICD-10-CM | POA: Diagnosis not present

## 2022-09-05 DIAGNOSIS — E7800 Pure hypercholesterolemia, unspecified: Secondary | ICD-10-CM | POA: Diagnosis not present

## 2022-09-05 DIAGNOSIS — I119 Hypertensive heart disease without heart failure: Secondary | ICD-10-CM | POA: Diagnosis not present

## 2022-09-05 DIAGNOSIS — I480 Paroxysmal atrial fibrillation: Secondary | ICD-10-CM | POA: Diagnosis not present

## 2022-09-05 DIAGNOSIS — Z96652 Presence of left artificial knee joint: Secondary | ICD-10-CM | POA: Diagnosis not present

## 2022-09-05 DIAGNOSIS — I700 Atherosclerosis of aorta: Secondary | ICD-10-CM | POA: Diagnosis not present

## 2022-09-05 DIAGNOSIS — I252 Old myocardial infarction: Secondary | ICD-10-CM | POA: Diagnosis not present

## 2022-09-05 DIAGNOSIS — I441 Atrioventricular block, second degree: Secondary | ICD-10-CM | POA: Diagnosis not present

## 2022-09-05 DIAGNOSIS — Z471 Aftercare following joint replacement surgery: Secondary | ICD-10-CM | POA: Diagnosis not present

## 2022-09-07 DIAGNOSIS — I480 Paroxysmal atrial fibrillation: Secondary | ICD-10-CM | POA: Diagnosis not present

## 2022-09-07 DIAGNOSIS — I441 Atrioventricular block, second degree: Secondary | ICD-10-CM | POA: Diagnosis not present

## 2022-09-07 DIAGNOSIS — I2510 Atherosclerotic heart disease of native coronary artery without angina pectoris: Secondary | ICD-10-CM | POA: Diagnosis not present

## 2022-09-07 DIAGNOSIS — I252 Old myocardial infarction: Secondary | ICD-10-CM | POA: Diagnosis not present

## 2022-09-07 DIAGNOSIS — I119 Hypertensive heart disease without heart failure: Secondary | ICD-10-CM | POA: Diagnosis not present

## 2022-09-07 DIAGNOSIS — I700 Atherosclerosis of aorta: Secondary | ICD-10-CM | POA: Diagnosis not present

## 2022-09-07 DIAGNOSIS — Z96652 Presence of left artificial knee joint: Secondary | ICD-10-CM | POA: Diagnosis not present

## 2022-09-07 DIAGNOSIS — E7800 Pure hypercholesterolemia, unspecified: Secondary | ICD-10-CM | POA: Diagnosis not present

## 2022-09-07 DIAGNOSIS — Z471 Aftercare following joint replacement surgery: Secondary | ICD-10-CM | POA: Diagnosis not present

## 2022-09-09 DIAGNOSIS — Z96652 Presence of left artificial knee joint: Secondary | ICD-10-CM | POA: Diagnosis not present

## 2022-09-09 DIAGNOSIS — I252 Old myocardial infarction: Secondary | ICD-10-CM | POA: Diagnosis not present

## 2022-09-09 DIAGNOSIS — I441 Atrioventricular block, second degree: Secondary | ICD-10-CM | POA: Diagnosis not present

## 2022-09-09 DIAGNOSIS — I480 Paroxysmal atrial fibrillation: Secondary | ICD-10-CM | POA: Diagnosis not present

## 2022-09-09 DIAGNOSIS — Z471 Aftercare following joint replacement surgery: Secondary | ICD-10-CM | POA: Diagnosis not present

## 2022-09-09 DIAGNOSIS — E7800 Pure hypercholesterolemia, unspecified: Secondary | ICD-10-CM | POA: Diagnosis not present

## 2022-09-09 DIAGNOSIS — I119 Hypertensive heart disease without heart failure: Secondary | ICD-10-CM | POA: Diagnosis not present

## 2022-09-09 DIAGNOSIS — I700 Atherosclerosis of aorta: Secondary | ICD-10-CM | POA: Diagnosis not present

## 2022-09-09 DIAGNOSIS — I2510 Atherosclerotic heart disease of native coronary artery without angina pectoris: Secondary | ICD-10-CM | POA: Diagnosis not present

## 2022-09-11 DIAGNOSIS — Z96652 Presence of left artificial knee joint: Secondary | ICD-10-CM | POA: Diagnosis not present

## 2022-09-11 DIAGNOSIS — I480 Paroxysmal atrial fibrillation: Secondary | ICD-10-CM | POA: Diagnosis not present

## 2022-09-11 DIAGNOSIS — E7800 Pure hypercholesterolemia, unspecified: Secondary | ICD-10-CM | POA: Diagnosis not present

## 2022-09-11 DIAGNOSIS — I2510 Atherosclerotic heart disease of native coronary artery without angina pectoris: Secondary | ICD-10-CM | POA: Diagnosis not present

## 2022-09-11 DIAGNOSIS — I252 Old myocardial infarction: Secondary | ICD-10-CM | POA: Diagnosis not present

## 2022-09-11 DIAGNOSIS — I119 Hypertensive heart disease without heart failure: Secondary | ICD-10-CM | POA: Diagnosis not present

## 2022-09-11 DIAGNOSIS — Z471 Aftercare following joint replacement surgery: Secondary | ICD-10-CM | POA: Diagnosis not present

## 2022-09-11 DIAGNOSIS — I441 Atrioventricular block, second degree: Secondary | ICD-10-CM | POA: Diagnosis not present

## 2022-09-11 DIAGNOSIS — I700 Atherosclerosis of aorta: Secondary | ICD-10-CM | POA: Diagnosis not present

## 2022-09-13 DIAGNOSIS — Z96652 Presence of left artificial knee joint: Secondary | ICD-10-CM | POA: Diagnosis not present

## 2022-09-13 DIAGNOSIS — I441 Atrioventricular block, second degree: Secondary | ICD-10-CM | POA: Diagnosis not present

## 2022-09-13 DIAGNOSIS — I2510 Atherosclerotic heart disease of native coronary artery without angina pectoris: Secondary | ICD-10-CM | POA: Diagnosis not present

## 2022-09-13 DIAGNOSIS — Z471 Aftercare following joint replacement surgery: Secondary | ICD-10-CM | POA: Diagnosis not present

## 2022-09-13 DIAGNOSIS — I119 Hypertensive heart disease without heart failure: Secondary | ICD-10-CM | POA: Diagnosis not present

## 2022-09-13 DIAGNOSIS — I700 Atherosclerosis of aorta: Secondary | ICD-10-CM | POA: Diagnosis not present

## 2022-09-13 DIAGNOSIS — I252 Old myocardial infarction: Secondary | ICD-10-CM | POA: Diagnosis not present

## 2022-09-13 DIAGNOSIS — I480 Paroxysmal atrial fibrillation: Secondary | ICD-10-CM | POA: Diagnosis not present

## 2022-09-13 DIAGNOSIS — E7800 Pure hypercholesterolemia, unspecified: Secondary | ICD-10-CM | POA: Diagnosis not present

## 2022-09-15 DIAGNOSIS — Z471 Aftercare following joint replacement surgery: Secondary | ICD-10-CM | POA: Diagnosis not present

## 2022-09-15 DIAGNOSIS — I2510 Atherosclerotic heart disease of native coronary artery without angina pectoris: Secondary | ICD-10-CM | POA: Diagnosis not present

## 2022-09-15 DIAGNOSIS — E7800 Pure hypercholesterolemia, unspecified: Secondary | ICD-10-CM | POA: Diagnosis not present

## 2022-09-15 DIAGNOSIS — I119 Hypertensive heart disease without heart failure: Secondary | ICD-10-CM | POA: Diagnosis not present

## 2022-09-15 DIAGNOSIS — I441 Atrioventricular block, second degree: Secondary | ICD-10-CM | POA: Diagnosis not present

## 2022-09-15 DIAGNOSIS — I252 Old myocardial infarction: Secondary | ICD-10-CM | POA: Diagnosis not present

## 2022-09-15 DIAGNOSIS — Z96652 Presence of left artificial knee joint: Secondary | ICD-10-CM | POA: Diagnosis not present

## 2022-09-15 DIAGNOSIS — I480 Paroxysmal atrial fibrillation: Secondary | ICD-10-CM | POA: Diagnosis not present

## 2022-09-15 DIAGNOSIS — I700 Atherosclerosis of aorta: Secondary | ICD-10-CM | POA: Diagnosis not present

## 2022-09-17 DIAGNOSIS — Z96652 Presence of left artificial knee joint: Secondary | ICD-10-CM | POA: Diagnosis not present

## 2022-09-17 DIAGNOSIS — M25562 Pain in left knee: Secondary | ICD-10-CM | POA: Diagnosis not present

## 2022-09-21 DIAGNOSIS — M25562 Pain in left knee: Secondary | ICD-10-CM | POA: Diagnosis not present

## 2022-09-21 DIAGNOSIS — Z96652 Presence of left artificial knee joint: Secondary | ICD-10-CM | POA: Diagnosis not present

## 2022-09-24 DIAGNOSIS — M25562 Pain in left knee: Secondary | ICD-10-CM | POA: Diagnosis not present

## 2022-09-24 DIAGNOSIS — Z96652 Presence of left artificial knee joint: Secondary | ICD-10-CM | POA: Diagnosis not present

## 2022-09-27 DIAGNOSIS — M25662 Stiffness of left knee, not elsewhere classified: Secondary | ICD-10-CM | POA: Diagnosis not present

## 2022-09-27 DIAGNOSIS — G8929 Other chronic pain: Secondary | ICD-10-CM | POA: Diagnosis not present

## 2022-09-27 DIAGNOSIS — M6281 Muscle weakness (generalized): Secondary | ICD-10-CM | POA: Diagnosis not present

## 2022-09-27 DIAGNOSIS — Z471 Aftercare following joint replacement surgery: Secondary | ICD-10-CM | POA: Diagnosis not present

## 2022-09-27 DIAGNOSIS — M25562 Pain in left knee: Secondary | ICD-10-CM | POA: Diagnosis not present

## 2022-09-27 DIAGNOSIS — Z96652 Presence of left artificial knee joint: Secondary | ICD-10-CM | POA: Diagnosis not present

## 2022-09-30 DIAGNOSIS — M25662 Stiffness of left knee, not elsewhere classified: Secondary | ICD-10-CM | POA: Diagnosis not present

## 2022-09-30 DIAGNOSIS — M25562 Pain in left knee: Secondary | ICD-10-CM | POA: Diagnosis not present

## 2022-09-30 DIAGNOSIS — G8929 Other chronic pain: Secondary | ICD-10-CM | POA: Diagnosis not present

## 2022-09-30 DIAGNOSIS — Z96652 Presence of left artificial knee joint: Secondary | ICD-10-CM | POA: Diagnosis not present

## 2022-09-30 DIAGNOSIS — M6281 Muscle weakness (generalized): Secondary | ICD-10-CM | POA: Diagnosis not present

## 2022-10-05 DIAGNOSIS — M25562 Pain in left knee: Secondary | ICD-10-CM | POA: Diagnosis not present

## 2022-10-05 DIAGNOSIS — Z96652 Presence of left artificial knee joint: Secondary | ICD-10-CM | POA: Diagnosis not present

## 2022-10-05 DIAGNOSIS — M6281 Muscle weakness (generalized): Secondary | ICD-10-CM | POA: Diagnosis not present

## 2022-10-05 DIAGNOSIS — G8929 Other chronic pain: Secondary | ICD-10-CM | POA: Diagnosis not present

## 2022-10-05 DIAGNOSIS — M25662 Stiffness of left knee, not elsewhere classified: Secondary | ICD-10-CM | POA: Diagnosis not present

## 2022-10-15 DIAGNOSIS — Z96652 Presence of left artificial knee joint: Secondary | ICD-10-CM | POA: Diagnosis not present

## 2022-12-02 DIAGNOSIS — I10 Essential (primary) hypertension: Secondary | ICD-10-CM | POA: Diagnosis not present

## 2022-12-02 DIAGNOSIS — Z45018 Encounter for adjustment and management of other part of cardiac pacemaker: Secondary | ICD-10-CM | POA: Diagnosis not present

## 2022-12-02 DIAGNOSIS — I4891 Unspecified atrial fibrillation: Secondary | ICD-10-CM | POA: Diagnosis not present

## 2022-12-10 DIAGNOSIS — Z950 Presence of cardiac pacemaker: Secondary | ICD-10-CM | POA: Diagnosis not present

## 2022-12-10 DIAGNOSIS — I4891 Unspecified atrial fibrillation: Secondary | ICD-10-CM | POA: Diagnosis not present

## 2022-12-16 DIAGNOSIS — Z950 Presence of cardiac pacemaker: Secondary | ICD-10-CM | POA: Diagnosis not present

## 2022-12-16 DIAGNOSIS — I4891 Unspecified atrial fibrillation: Secondary | ICD-10-CM | POA: Diagnosis not present

## 2022-12-29 DIAGNOSIS — Z96652 Presence of left artificial knee joint: Secondary | ICD-10-CM | POA: Diagnosis not present

## 2022-12-30 DIAGNOSIS — R7989 Other specified abnormal findings of blood chemistry: Secondary | ICD-10-CM | POA: Diagnosis not present

## 2022-12-30 DIAGNOSIS — E559 Vitamin D deficiency, unspecified: Secondary | ICD-10-CM | POA: Diagnosis not present

## 2022-12-30 DIAGNOSIS — Z96652 Presence of left artificial knee joint: Secondary | ICD-10-CM | POA: Diagnosis not present

## 2022-12-30 DIAGNOSIS — I119 Hypertensive heart disease without heart failure: Secondary | ICD-10-CM | POA: Diagnosis not present

## 2022-12-30 DIAGNOSIS — E782 Mixed hyperlipidemia: Secondary | ICD-10-CM | POA: Diagnosis not present

## 2022-12-30 DIAGNOSIS — M170 Bilateral primary osteoarthritis of knee: Secondary | ICD-10-CM | POA: Diagnosis not present

## 2022-12-30 DIAGNOSIS — I2510 Atherosclerotic heart disease of native coronary artery without angina pectoris: Secondary | ICD-10-CM | POA: Diagnosis not present

## 2022-12-30 DIAGNOSIS — I10 Essential (primary) hypertension: Secondary | ICD-10-CM | POA: Diagnosis not present

## 2022-12-30 DIAGNOSIS — I480 Paroxysmal atrial fibrillation: Secondary | ICD-10-CM | POA: Diagnosis not present

## 2022-12-30 DIAGNOSIS — Z09 Encounter for follow-up examination after completed treatment for conditions other than malignant neoplasm: Secondary | ICD-10-CM | POA: Diagnosis not present

## 2023-04-04 DIAGNOSIS — Z952 Presence of prosthetic heart valve: Secondary | ICD-10-CM | POA: Diagnosis not present

## 2023-04-04 DIAGNOSIS — Z951 Presence of aortocoronary bypass graft: Secondary | ICD-10-CM | POA: Diagnosis not present

## 2023-04-04 DIAGNOSIS — Z950 Presence of cardiac pacemaker: Secondary | ICD-10-CM | POA: Diagnosis not present

## 2023-04-04 DIAGNOSIS — I10 Essential (primary) hypertension: Secondary | ICD-10-CM | POA: Diagnosis not present

## 2023-04-04 DIAGNOSIS — I350 Nonrheumatic aortic (valve) stenosis: Secondary | ICD-10-CM | POA: Diagnosis not present

## 2023-04-04 DIAGNOSIS — I4891 Unspecified atrial fibrillation: Secondary | ICD-10-CM | POA: Diagnosis not present

## 2023-04-04 DIAGNOSIS — I2510 Atherosclerotic heart disease of native coronary artery without angina pectoris: Secondary | ICD-10-CM | POA: Diagnosis not present

## 2023-04-05 DIAGNOSIS — Z96652 Presence of left artificial knee joint: Secondary | ICD-10-CM | POA: Diagnosis not present

## 2023-07-14 DIAGNOSIS — Z952 Presence of prosthetic heart valve: Secondary | ICD-10-CM | POA: Diagnosis not present

## 2023-07-14 DIAGNOSIS — I2510 Atherosclerotic heart disease of native coronary artery without angina pectoris: Secondary | ICD-10-CM | POA: Diagnosis not present

## 2023-07-14 DIAGNOSIS — R7303 Prediabetes: Secondary | ICD-10-CM | POA: Diagnosis not present

## 2023-07-14 DIAGNOSIS — Z951 Presence of aortocoronary bypass graft: Secondary | ICD-10-CM | POA: Diagnosis not present

## 2023-09-12 IMAGING — CT CT HEAD W/O CM
4 series · 17 of 47 positions shown, 19 images · non-contrast
Comparison: None.

CLINICAL DATA: Syncope



[Series 2: head wo · axial · 0.46mm/px · z∈[-145,-25]mm · 7 of 33 slices shown, 9 images]
[im 5/33  brain]
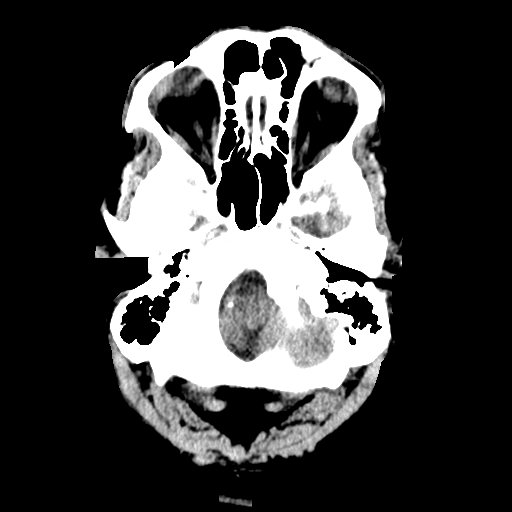
[im 5/33  bone]
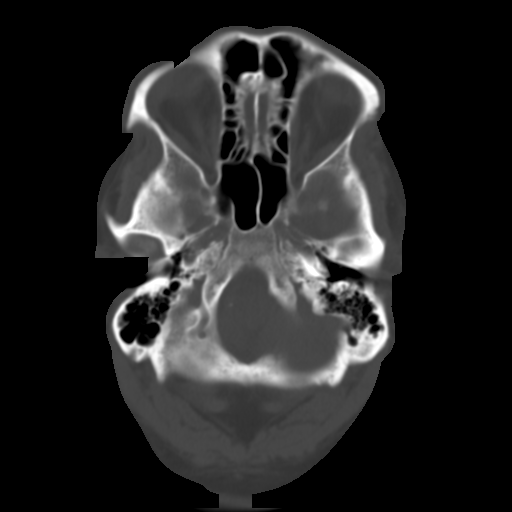
[im 9/33  brain]
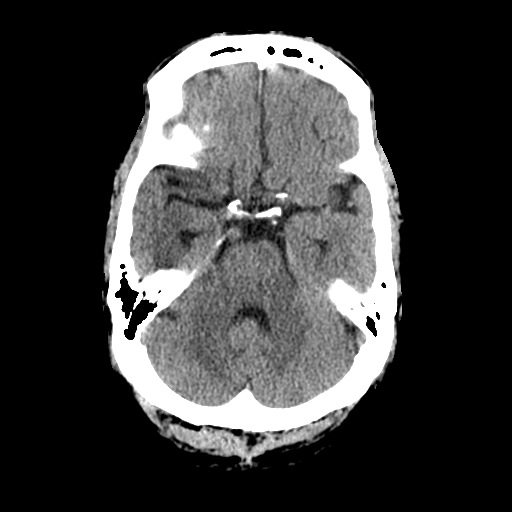
[im 13/33  brain]
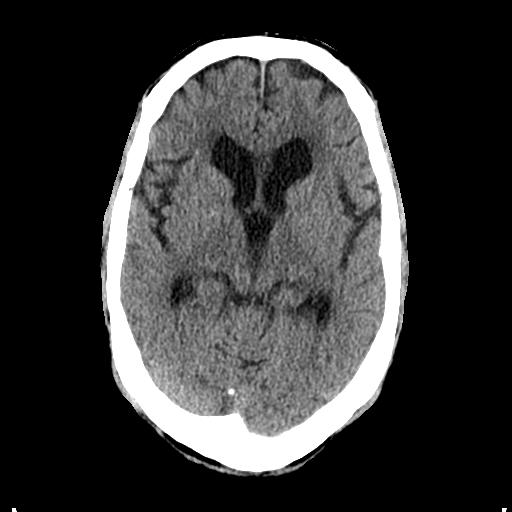
[im 17/33  brain]
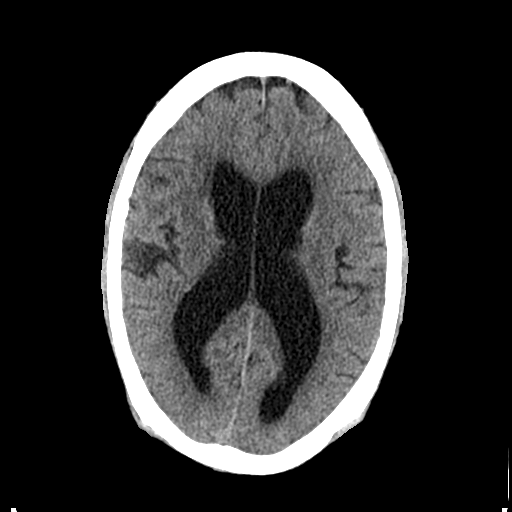
[im 21/33  brain]
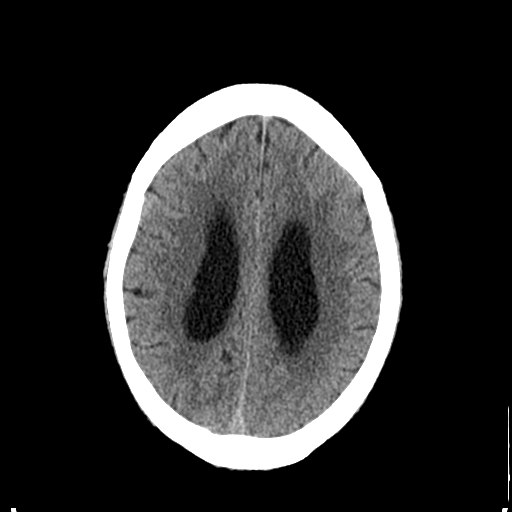
[im 21/33  bone]
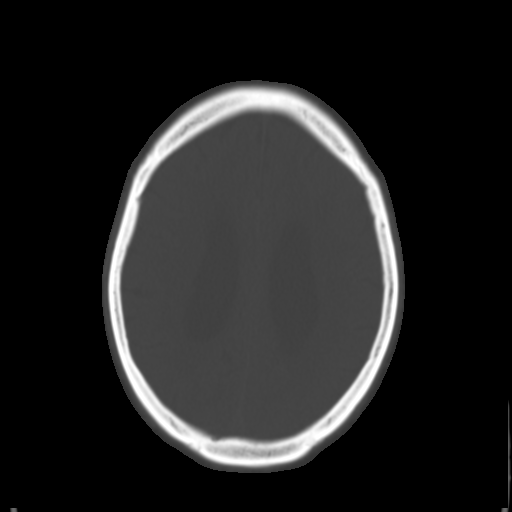
[im 25/33  brain]
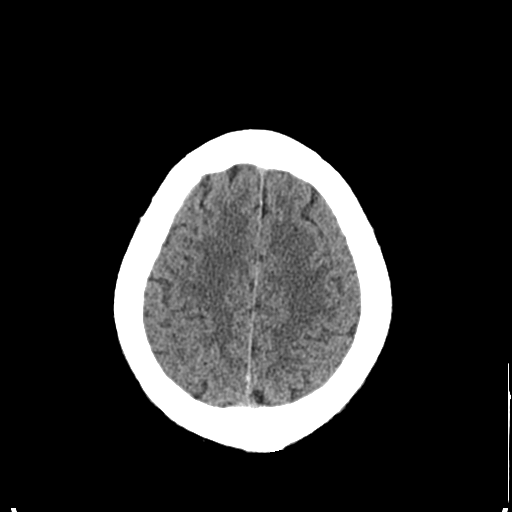
[im 29/33  brain]
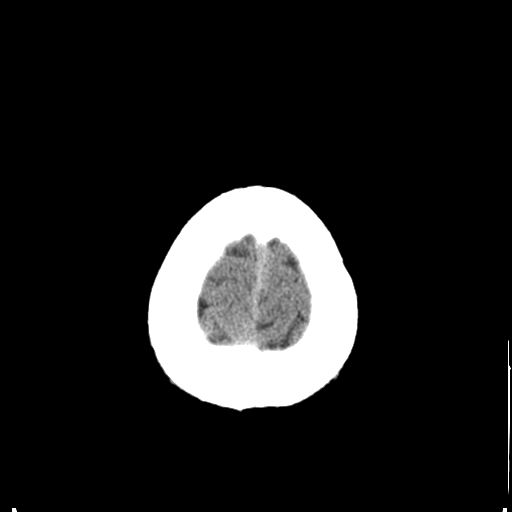

[Series 3: head bone · axial · 0.46mm/px · z∈[-149,-93]mm · 4 of 81 slices shown]
[im 9/81  bone]
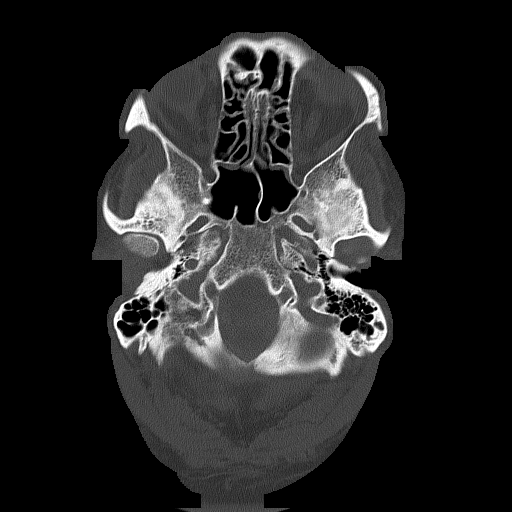
[im 17/81  bone]
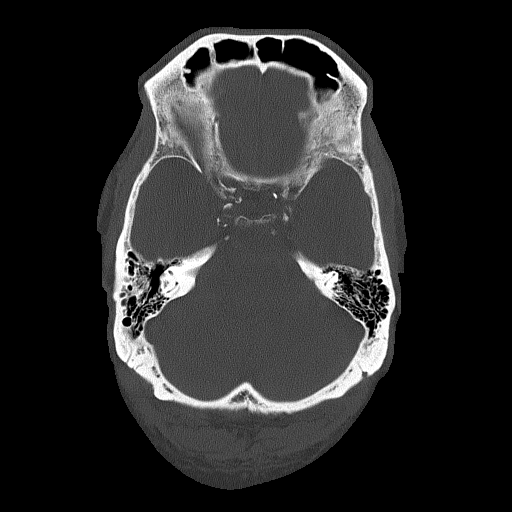
[im 25/81  bone]
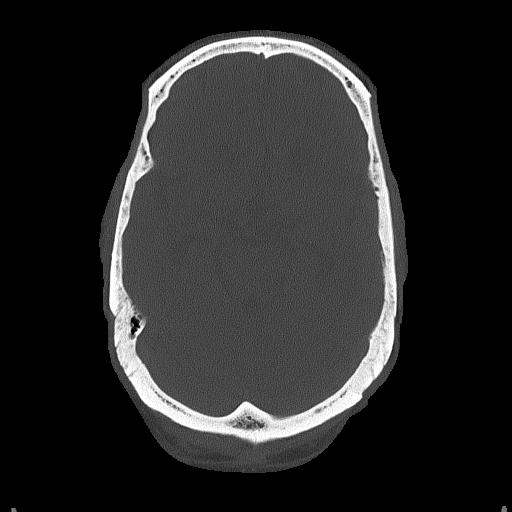
[im 37/81  bone]
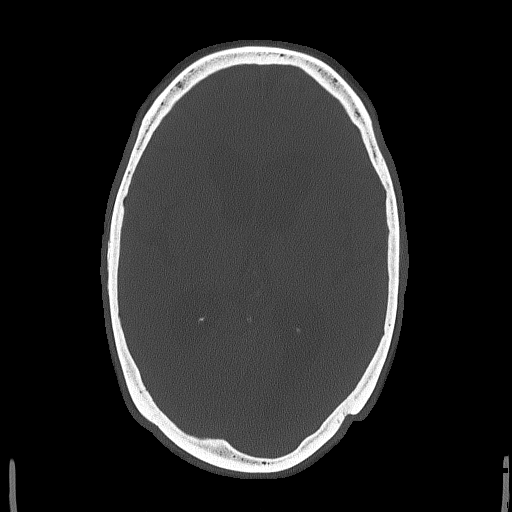

[Series 4: coronal soft tissue · coronal · 0.35mm/px · 3 of 73 slices shown]
[im 25/73  brain]
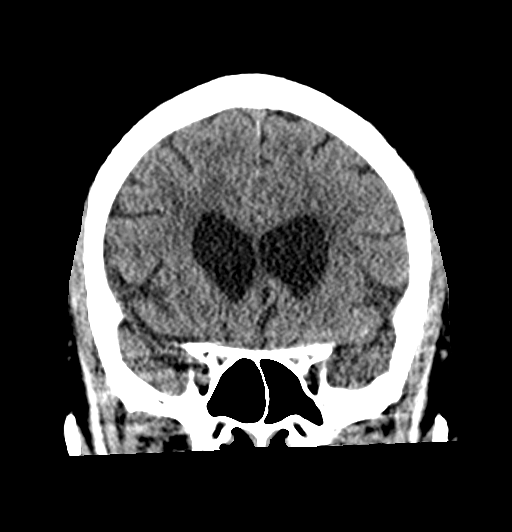
[im 33/73  brain]
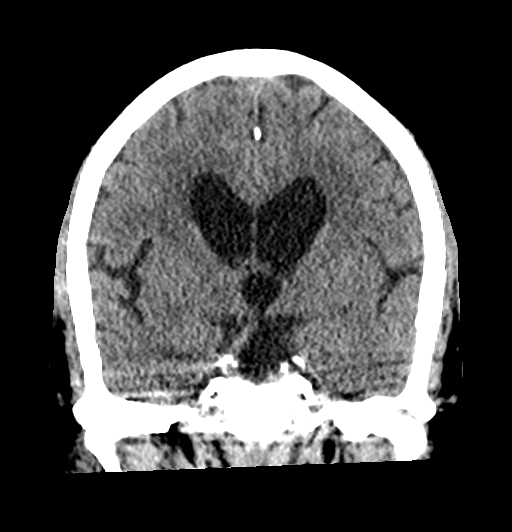
[im 41/73  brain]
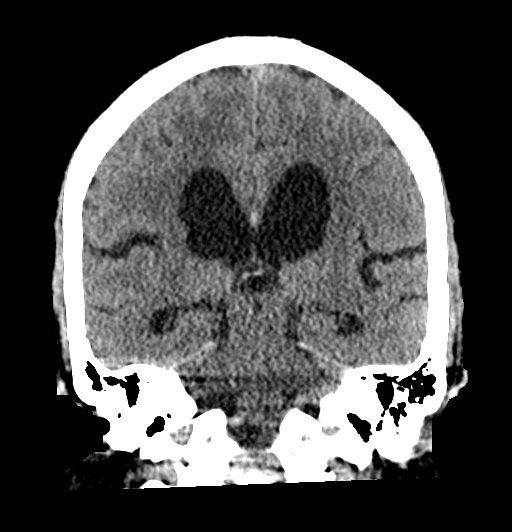

[Series 5: sagittal soft tissue · sagittal · 0.37mm/px · 3 of 61 slices shown]
[im 21/61  brain]
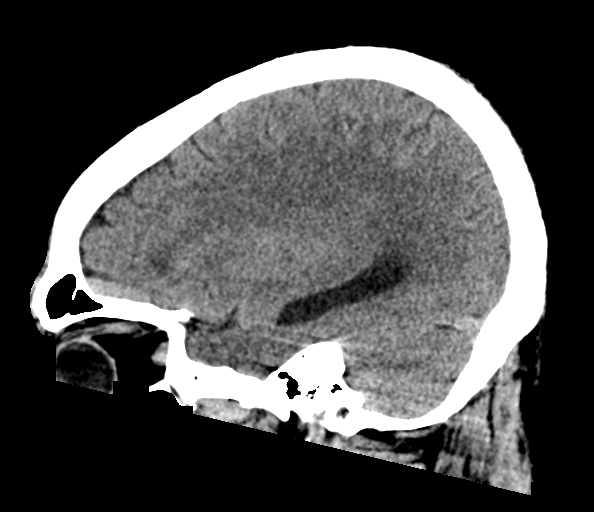
[im 31/61  brain]
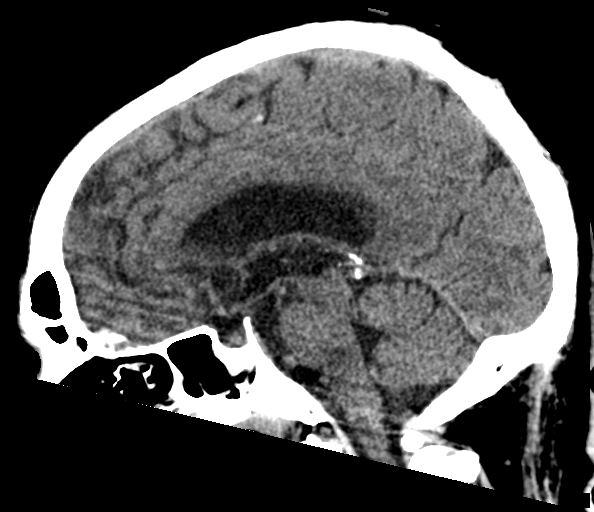
[im 41/61  brain]
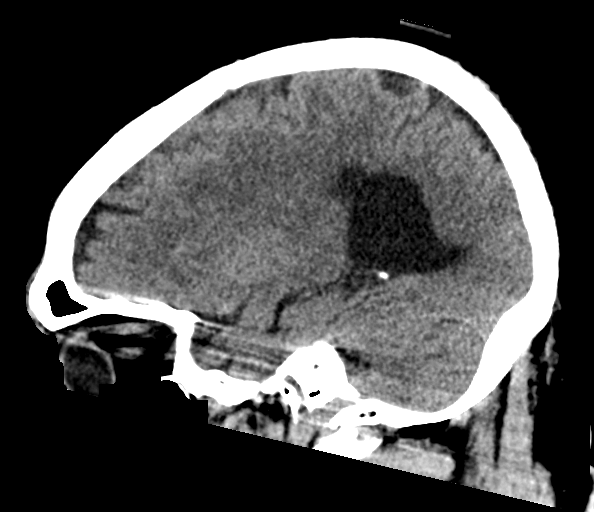

[17 of 47 positions shown; findings below may reference images not displayed]

FINDINGS: Brain: No acute intracranial hemorrhage, mass effect, or herniation.
No extra-axial fluid collections. No evidence of acute territorial
infarct. No hydrocephalus. Mild cortical volume loss. Patchy
hypodensities in the periventricular and subcortical white matter,
likely secondary to chronic microvascular ischemic changes.

Vascular: Calcified plaques in the carotid siphons.

Skull: Normal. Negative for fracture or focal lesion.

Sinuses/Orbits: No acute finding.

Other: None.
IMPRESSION: Chronic changes with no acute intracranial process identified.

## 2023-10-05 IMAGING — US US RENAL
1 series · 13 of 25 positions shown · non-contrast
Comparison: CT, 08/10/2021.

CLINICAL DATA: Bilateral kidney stones.  Lithotripsy 08/03/2021.

EXAM:
RENAL / URINARY TRACT ULTRASOUND COMPLETE

[Series 1: us renal · 0.26mm/px · 53 acquisitions, 13 frames shown]
[im 1/53]
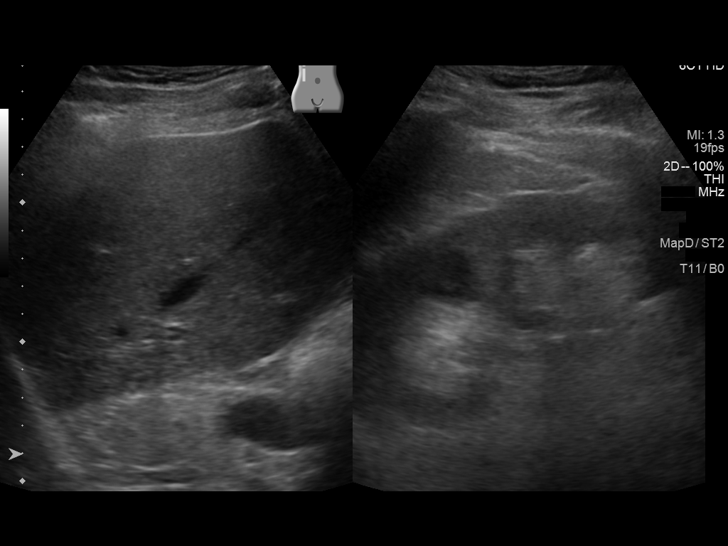
[im 5/53]
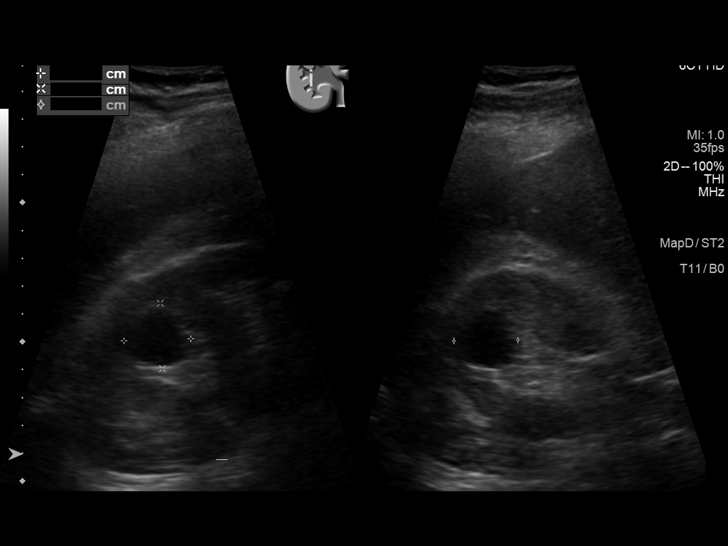
[im 9/53]
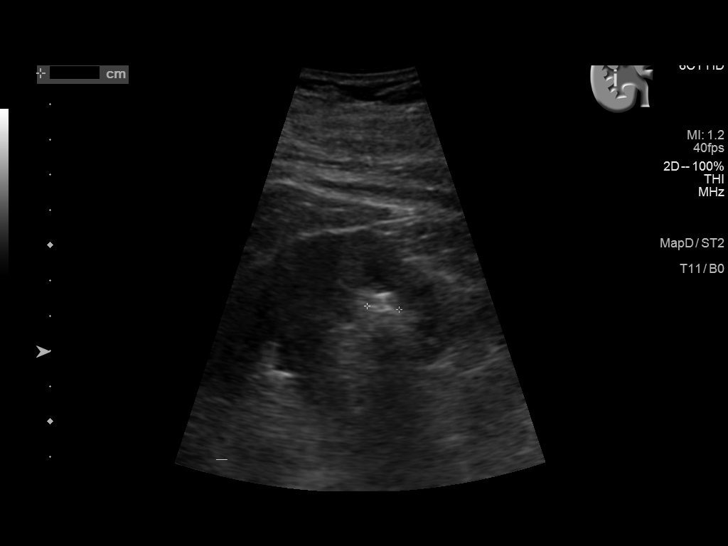
[im 14/53]
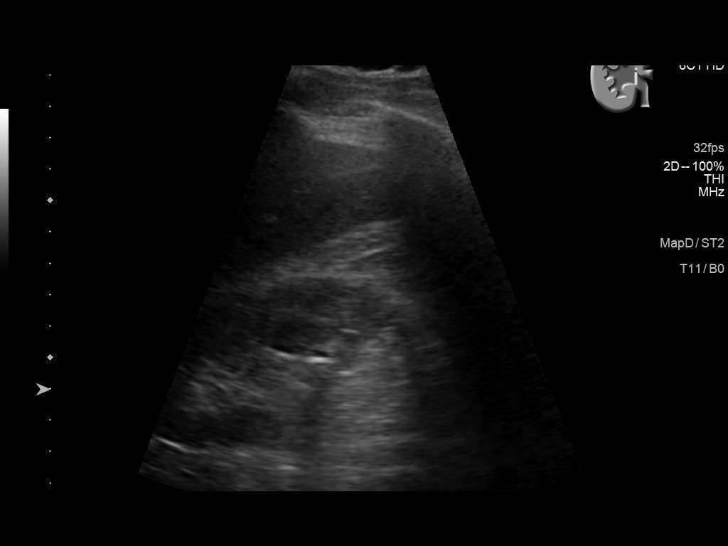
[im 18/53]
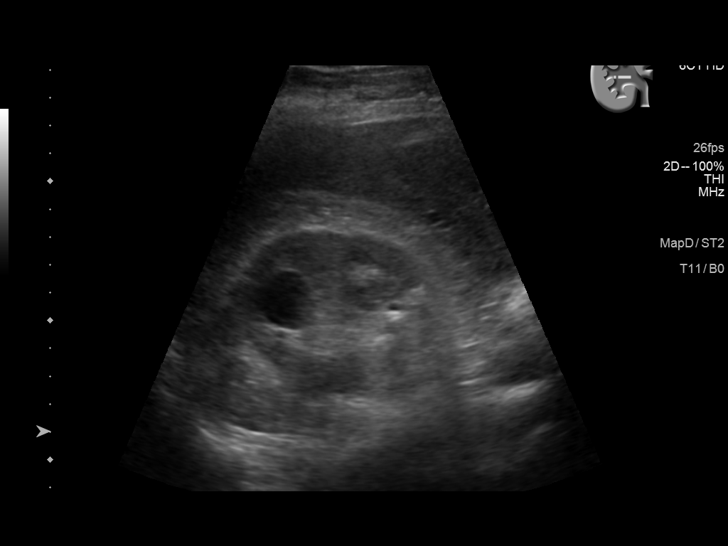
[im 22/53]
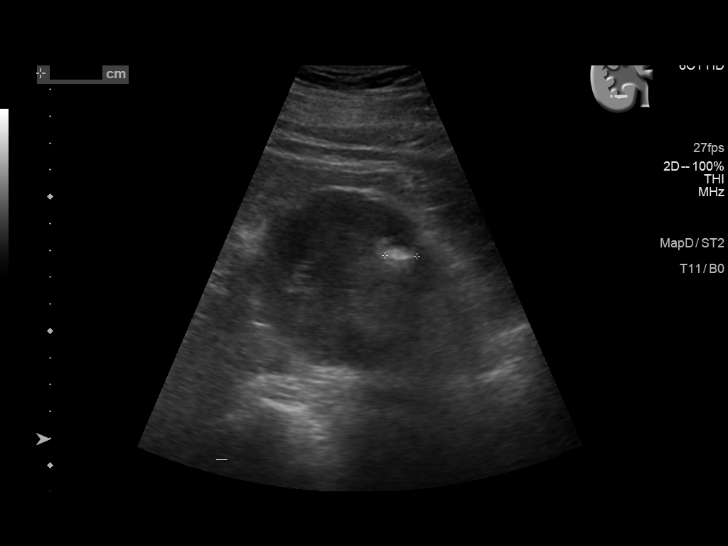
[im 27/53]
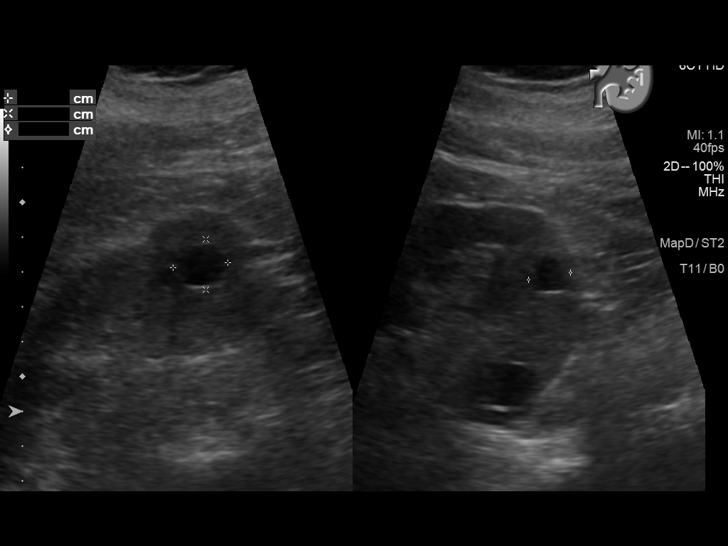
[im 31/53]
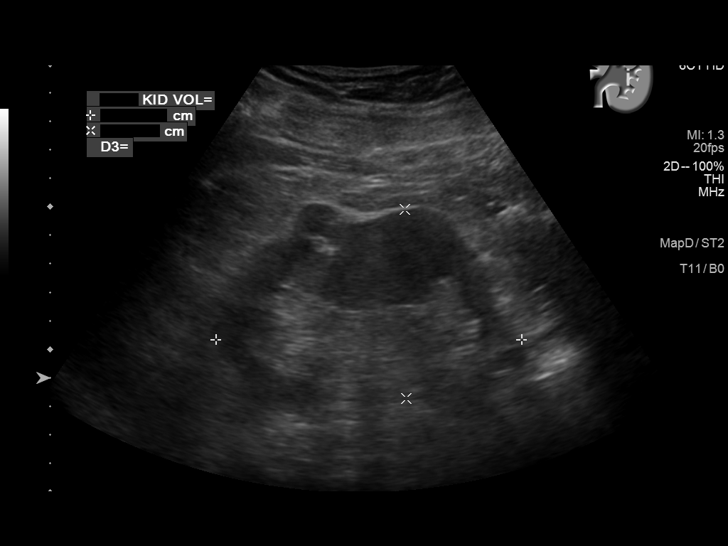
[im 35/53]
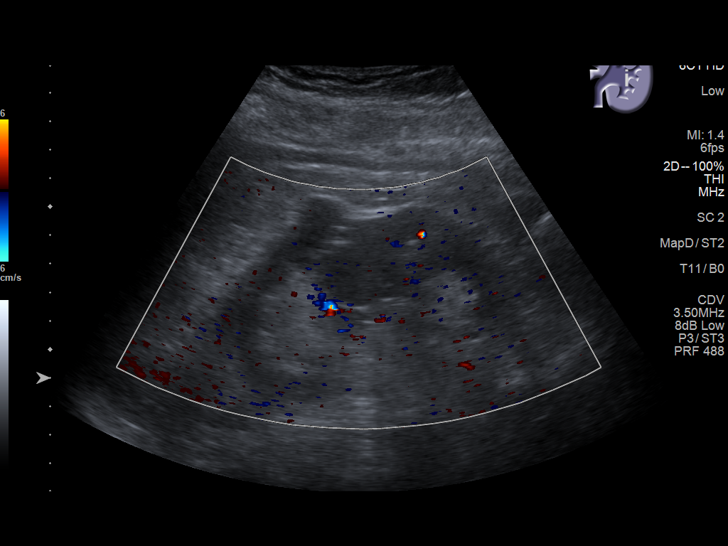
[im 40/53]
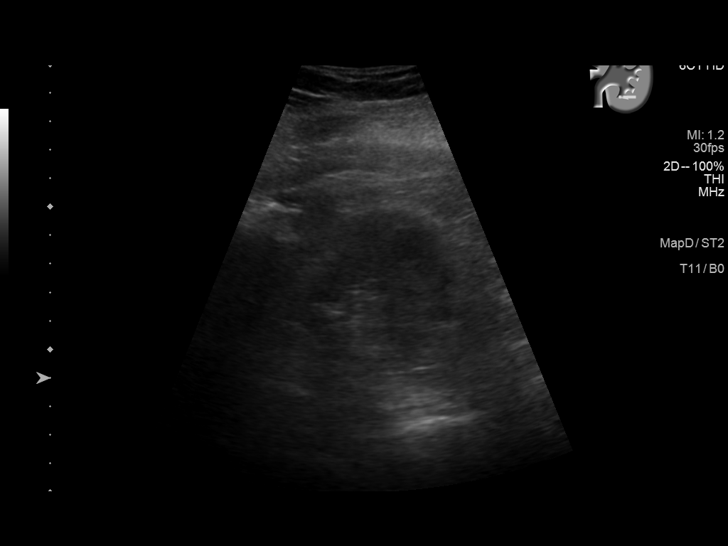
[im 44/53]
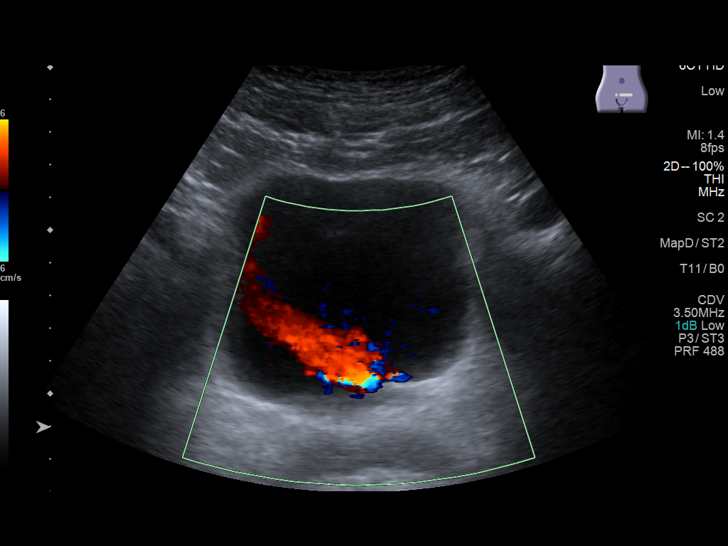
[im 48/53]
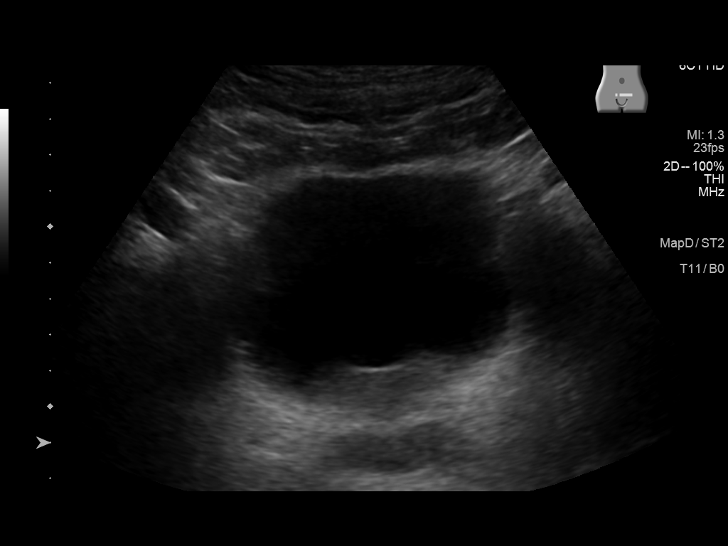
[im 53/53]
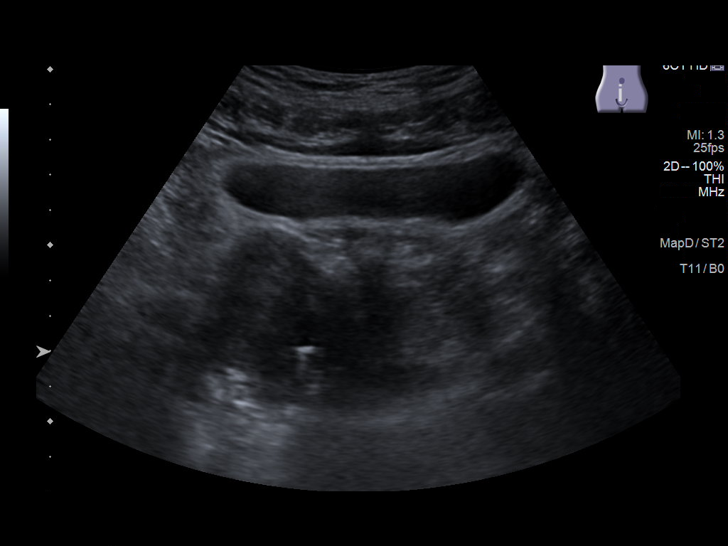

[13 of 25 positions shown; findings below may reference images not displayed]

FINDINGS: Right Kidney:

Renal measurements: 11.4 x 6.5 x 6.2 cm = volume: 240 mL. Normal
parenchymal echogenicity. Mild diffuse cortical thinning. Several
shadowing calculi, largest 12 mm, lower pole. Midpole cyst, 2.4 x
2.3 x 2.4 cm. Lower pole cyst, 2.0 x 1.9 x 2.0 cm. No
hydronephrosis.

Left Kidney:

Renal measurements: 10.7 x 6.6 x 5.9 cm = volume: 219 mL. Normal
parenchymal echogenicity. Mild diffuse renal cortical thinning.
Several shadowing calculi, largest discrete stone in the lower pole,
1.2 cm. Three discrete cysts, cortical midpole cyst, 2.0 x 1.8 x
cm. Lower pole cyst with a thin septation, 2.3 x 1.6 x 1.9 cm.
Adjacent smaller lower pole cyst, 1.6 x 1.2 x 1.5 cm. No solid
masses. No hydronephrosis.

Bladder:

Appears normal for degree of bladder distention.

Other:

Enlarged heterogeneous thyroid.
IMPRESSION: 1. Multiple intrarenal stones and several renal cysts.
2. No acute findings.  No hydronephrosis.
3. Mild bilateral renal cortical thinning.
4. Enlarged heterogeneous prostate, not well defined
sonographically.

## 2023-12-09 ENCOUNTER — Other Ambulatory Visit: Payer: Self-pay

## 2023-12-09 ENCOUNTER — Emergency Department
Admission: EM | Admit: 2023-12-09 | Discharge: 2023-12-25 | Disposition: E | Payer: Medicare (Managed Care) | Attending: Emergency Medicine | Admitting: Emergency Medicine

## 2023-12-09 DIAGNOSIS — E119 Type 2 diabetes mellitus without complications: Secondary | ICD-10-CM | POA: Insufficient documentation

## 2023-12-09 DIAGNOSIS — I10 Essential (primary) hypertension: Secondary | ICD-10-CM | POA: Insufficient documentation

## 2023-12-09 DIAGNOSIS — I2510 Atherosclerotic heart disease of native coronary artery without angina pectoris: Secondary | ICD-10-CM | POA: Diagnosis not present

## 2023-12-09 DIAGNOSIS — I469 Cardiac arrest, cause unspecified: Secondary | ICD-10-CM | POA: Insufficient documentation

## 2023-12-09 MED ORDER — SODIUM BICARBONATE 8.4 % IV SOLN
INTRAVENOUS | Status: AC | PRN
  Administered 2023-12-09: 50 meq via INTRAVENOUS

## 2023-12-09 MED ORDER — EPINEPHRINE 1 MG/10ML IJ SOSY
PREFILLED_SYRINGE | INTRAMUSCULAR | Status: AC | PRN
  Administered 2023-12-09 (×4): 1 mg via INTRAVENOUS

## 2023-12-09 MED ORDER — LIDOCAINE HCL (CARDIAC) PF 100 MG/5ML IV SOSY
PREFILLED_SYRINGE | INTRAVENOUS | Status: AC | PRN
  Administered 2023-12-09: 100 mg via INTRAVENOUS

## 2023-12-25 NOTE — Progress Notes (Signed)
   12/07/2023 1900  Spiritual Encounters  Type of Visit Initial  Care provided to: Family  Referral source Code page  Reason for visit Patient death  OnCall Visit Yes  Interventions  Spiritual Care Interventions Made Established relationship of care and support;Compassionate presence;Prayer;Encouragement;Supported grief process  Intervention Outcomes  Outcomes Connection to spiritual care   Chaplain provided compassionate presence, encouragement,and prayer with the spouse of the pt. Chaplain also encourage other members of the family.

## 2023-12-25 NOTE — ED Notes (Signed)
Pt transferred to the morgue.

## 2023-12-25 NOTE — ED Triage Notes (Signed)
 Pt found down in private vehicle for an unknown amount of time. EMS initiated CPR and pt was in V-fib. Upon arrival pt is asystole at pulse checks and unresponsive.

## 2023-12-25 NOTE — ED Notes (Addendum)
 This RN with assistance placed pt in body bag. Tags in place with pt labels.

## 2023-12-25 NOTE — ED Provider Notes (Signed)
 [facility] Provider Note   Event Date/Time   First MD Initiated Contact with Patient 12/03/2023 1743     (approximate) History  No chief complaint on file.  HPI Joseph Schmitt is a 68 y.o. male with a past medical history of CAD, PAD, type 2 diabetes, hypertension, and hyperlipidemia who presents via EMS after being found unresponsive in arrive CPR in progress.  Patient was found to be in asystole and after a milligram of epinephrine  went into fine ventricular fibrillation and received defibrillation as well as amiodarone.  Patient has had 9 subsequent defibrillations and now is only showing pulseless asystole on each pulse check.  Per report, patient was found unresponsive in his vehicle at a stoplight by bystanders ROS: Unable to assess   Physical Exam  Triage Vital Signs: ED Triage Vitals [12/15/2023 1727]  Encounter Vitals Group     BP      Systolic BP Percentile      Diastolic BP Percentile      Pulse Rate (!) 124     Resp (!) 59     Temp      Temp src      SpO2 (!) 81 %     Weight      Height      Head Circumference      Peak Flow      Pain Score      Pain Loc      Pain Education      Exclude from Growth Chart    Most recent vital signs: Vitals:   12/07/2023 1727  Pulse: (!) 124  Resp: (!) 59  SpO2: (!) 81%   General: Unresponsive on stretcher with Lucas device in place providing CPR CV:  Poor peripheral perfusion Resp:  No effort.  Abd:  No distention.  Other:  Elderly obese African-American male unresponsive on stretcher with CPR in progress ED Results / Procedures / Treatments  PROCEDURES: Critical Care performed: Yes, see critical care procedure note(s) Procedures MEDICATIONS ORDERED IN ED: Medications  EPINEPHrine  (ADRENALIN ) 1 MG/10ML injection (1 mg Intravenous Given 11/28/2023 1737)  sodium bicarbonate injection (50 mEq Intravenous Given 12/17/2023 1725)  lidocaine  (cardiac) 100 mg/54mL (XYLOCAINE ) injection 2% (100 mg Intravenous  Given 11/27/2023 1732)   IMPRESSION / MDM / ASSESSMENT AND PLAN / ED COURSE  I reviewed the triage vital signs and the nursing notes.                             The patient is on the cardiac monitor to evaluate for evidence of arrhythmia and/or significant heart rate changes. Patient's presentation is most consistent with acute presentation with potential threat to life or bodily function. Initial Rhythm: PEA Cardiac compression was performed by staff in order to sustain blood flow. The patient was ventilated and oxygenated. The patient received appropriate ACLS measures and these were repeated as necessary throughout the resuscitation.  CPR was performed under my direct supervision and guidance.  See nursing note for medications and times given.  Critical care time spent 41 minutes in coordination of efforts between myself as well as the rest of the ICU staff.  There was no family that was available to discuss cessation of resuscitative efforts however as patient had been unresponsive for approximately 1 hour, the decision was made secondary to very poor outcome even if resuscitative efforts were successful. After discontinuation of resuscitation, I did not observe spontaneous breathing or appreciate  heart sounds on auscultation. There was no palpable radial pulse. The patient did not respond to nail bed stimuli. I examined the patient and there was no pupillary response to light. Patient was pronounced deceased.   FINAL CLINICAL IMPRESSION(S) / ED DIAGNOSES   Final diagnoses:  Cardiac arrest [facility])   Rx / DC Orders   ED Discharge Orders     None      Note:  This document was prepared using Dragon voice recognition software and may include unintentional dictation errors.   Casidy Alberta K, MD 12/15/2023 [id number]

## 2023-12-25 NOTE — ED Notes (Signed)
 Wife at bedside. Chaplain paged per Gareld June.

## 2023-12-25 NOTE — Code Documentation (Signed)
 Patient time of death occurred at 54.

## 2023-12-25 DEATH — deceased
